# Patient Record
Sex: Female | Born: 1950 | Race: White | Hispanic: No | Smoking: Never smoker
Health system: Southern US, Community
[De-identification: ages and names within clinical notes are randomized; demographics above are authoritative.]

## PROBLEM LIST (undated history)

## (undated) DIAGNOSIS — N950 Postmenopausal bleeding: Secondary | ICD-10-CM

## (undated) DIAGNOSIS — M1990 Unspecified osteoarthritis, unspecified site: Secondary | ICD-10-CM

## (undated) DIAGNOSIS — F32A Depression, unspecified: Secondary | ICD-10-CM

## (undated) DIAGNOSIS — G8929 Other chronic pain: Secondary | ICD-10-CM

## (undated) DIAGNOSIS — M47819 Spondylosis without myelopathy or radiculopathy, site unspecified: Secondary | ICD-10-CM

## (undated) DIAGNOSIS — N840 Polyp of corpus uteri: Secondary | ICD-10-CM

## (undated) DIAGNOSIS — C801 Malignant (primary) neoplasm, unspecified: Secondary | ICD-10-CM

## (undated) DIAGNOSIS — R200 Anesthesia of skin: Secondary | ICD-10-CM

## (undated) DIAGNOSIS — Z806 Family history of leukemia: Secondary | ICD-10-CM

## (undated) DIAGNOSIS — Z808 Family history of malignant neoplasm of other organs or systems: Secondary | ICD-10-CM

## (undated) DIAGNOSIS — M810 Age-related osteoporosis without current pathological fracture: Secondary | ICD-10-CM

## (undated) DIAGNOSIS — J189 Pneumonia, unspecified organism: Secondary | ICD-10-CM

## (undated) DIAGNOSIS — M545 Low back pain, unspecified: Secondary | ICD-10-CM

## (undated) DIAGNOSIS — Z923 Personal history of irradiation: Secondary | ICD-10-CM

## (undated) DIAGNOSIS — R030 Elevated blood-pressure reading, without diagnosis of hypertension: Secondary | ICD-10-CM

## (undated) DIAGNOSIS — Z8489 Family history of other specified conditions: Secondary | ICD-10-CM

## (undated) DIAGNOSIS — Z973 Presence of spectacles and contact lenses: Secondary | ICD-10-CM

## (undated) DIAGNOSIS — F329 Major depressive disorder, single episode, unspecified: Secondary | ICD-10-CM

## (undated) HISTORY — DX: Family history of malignant neoplasm of other organs or systems: Z80.8

## (undated) HISTORY — DX: Family history of leukemia: Z80.6

## (undated) HISTORY — DX: Personal history of irradiation: Z92.3

---

## 1975-11-23 HISTORY — PX: INCISION AND DRAINAGE BREAST ABSCESS: SUR672

## 1998-08-20 ENCOUNTER — Emergency Department (HOSPITAL_COMMUNITY): Admission: EM | Admit: 1998-08-20 | Discharge: 1998-08-21 | Payer: Self-pay | Admitting: Emergency Medicine

## 2003-08-30 ENCOUNTER — Other Ambulatory Visit: Admission: RE | Admit: 2003-08-30 | Discharge: 2003-08-30 | Payer: Self-pay | Admitting: Obstetrics and Gynecology

## 2003-10-01 ENCOUNTER — Encounter: Admission: RE | Admit: 2003-10-01 | Discharge: 2003-10-01 | Payer: Self-pay | Admitting: Internal Medicine

## 2003-10-03 ENCOUNTER — Encounter: Admission: RE | Admit: 2003-10-03 | Discharge: 2003-10-03 | Payer: Self-pay | Admitting: Internal Medicine

## 2005-02-26 ENCOUNTER — Other Ambulatory Visit: Admission: RE | Admit: 2005-02-26 | Discharge: 2005-02-26 | Payer: Self-pay | Admitting: Obstetrics and Gynecology

## 2005-03-03 ENCOUNTER — Ambulatory Visit (HOSPITAL_COMMUNITY): Admission: RE | Admit: 2005-03-03 | Discharge: 2005-03-03 | Payer: Self-pay | Admitting: Obstetrics and Gynecology

## 2005-11-22 HISTORY — PX: LUMBAR FUSION: SHX111

## 2006-07-03 ENCOUNTER — Inpatient Hospital Stay (HOSPITAL_COMMUNITY): Admission: EM | Admit: 2006-07-03 | Discharge: 2006-07-12 | Payer: Self-pay | Admitting: Emergency Medicine

## 2006-07-03 ENCOUNTER — Encounter: Payer: Self-pay | Admitting: *Deleted

## 2010-12-12 ENCOUNTER — Encounter: Payer: Self-pay | Admitting: Internal Medicine

## 2011-02-12 ENCOUNTER — Emergency Department (HOSPITAL_COMMUNITY)
Admission: EM | Admit: 2011-02-12 | Discharge: 2011-02-12 | Disposition: A | Discharge status: Home / Self Care | Payer: Self-pay | Attending: Emergency Medicine | Admitting: Emergency Medicine

## 2011-02-12 DIAGNOSIS — R3915 Urgency of urination: Secondary | ICD-10-CM | POA: Insufficient documentation

## 2011-02-12 DIAGNOSIS — M810 Age-related osteoporosis without current pathological fracture: Secondary | ICD-10-CM | POA: Insufficient documentation

## 2011-02-12 DIAGNOSIS — M545 Low back pain, unspecified: Secondary | ICD-10-CM | POA: Insufficient documentation

## 2011-02-12 DIAGNOSIS — R32 Unspecified urinary incontinence: Secondary | ICD-10-CM | POA: Insufficient documentation

## 2011-02-12 DIAGNOSIS — R209 Unspecified disturbances of skin sensation: Secondary | ICD-10-CM | POA: Insufficient documentation

## 2011-02-12 DIAGNOSIS — F411 Generalized anxiety disorder: Secondary | ICD-10-CM | POA: Insufficient documentation

## 2011-02-12 LAB — URINALYSIS, ROUTINE W REFLEX MICROSCOPIC
Bilirubin Urine: NEGATIVE
Glucose, UA: NEGATIVE mg/dL
Hgb urine dipstick: NEGATIVE
Ketones, ur: NEGATIVE mg/dL
Nitrite: NEGATIVE
Protein, ur: NEGATIVE mg/dL
Specific Gravity, Urine: 1.009 (ref 1.005–1.030)
Urobilinogen, UA: 0.2 mg/dL (ref 0.0–1.0)
pH: 5.5 (ref 5.0–8.0)

## 2011-05-24 ENCOUNTER — Inpatient Hospital Stay (INDEPENDENT_AMBULATORY_CARE_PROVIDER_SITE_OTHER)
Admission: RE | Admit: 2011-05-24 | Discharge: 2011-05-24 | Disposition: A | Discharge status: Home / Self Care | Payer: Self-pay | Source: Ambulatory Visit | Attending: Family Medicine | Admitting: Family Medicine

## 2011-05-24 ENCOUNTER — Ambulatory Visit (INDEPENDENT_AMBULATORY_CARE_PROVIDER_SITE_OTHER): Payer: Self-pay

## 2011-05-24 DIAGNOSIS — M549 Dorsalgia, unspecified: Secondary | ICD-10-CM

## 2013-07-05 ENCOUNTER — Encounter (HOSPITAL_COMMUNITY): Payer: Self-pay | Admitting: *Deleted

## 2013-07-05 DIAGNOSIS — R0789 Other chest pain: Secondary | ICD-10-CM | POA: Insufficient documentation

## 2013-07-05 DIAGNOSIS — Z79899 Other long term (current) drug therapy: Secondary | ICD-10-CM | POA: Insufficient documentation

## 2013-07-05 NOTE — ED Notes (Signed)
The pt has had lt chest pain for 2 days just under her lt breast,  Sob today.  No cardiac history

## 2013-07-06 ENCOUNTER — Emergency Department (HOSPITAL_COMMUNITY)
Admission: EM | Admit: 2013-07-06 | Discharge: 2013-07-06 | Disposition: A | Discharge status: Home / Self Care | Payer: Medicaid Other | Attending: Emergency Medicine | Admitting: Emergency Medicine

## 2013-07-06 ENCOUNTER — Emergency Department (HOSPITAL_COMMUNITY): Payer: Medicaid Other

## 2013-07-06 DIAGNOSIS — R0789 Other chest pain: Secondary | ICD-10-CM

## 2013-07-06 LAB — BASIC METABOLIC PANEL
BUN: 9 mg/dL (ref 6–23)
CO2: 25 mEq/L (ref 19–32)
Calcium: 9.5 mg/dL (ref 8.4–10.5)
Chloride: 102 mEq/L (ref 96–112)
Creatinine, Ser: 0.81 mg/dL (ref 0.50–1.10)
GFR calc Af Amer: >90 mL/min (ref >90)
GFR calc non Af Amer: 78 mL/min — ABNORMAL LOW (ref >90)
Glucose, Bld: 115 mg/dL — ABNORMAL HIGH (ref 70–99)
Potassium: 3.5 mEq/L (ref 3.5–5.1)
Sodium: 139 mEq/L (ref 135–145)

## 2013-07-06 LAB — CBC
HCT: 38.1 % (ref 36.0–46.0)
Hemoglobin: 13.3 g/dL (ref 12.0–15.0)
MCH: 32.4 pg (ref 26.0–34.0)
MCHC: 34.9 g/dL (ref 30.0–36.0)
MCV: 92.9 fL (ref 78.0–100.0)
Platelets: 260 10*3/uL (ref 150–400)
RBC: 4.1 MIL/uL (ref 3.87–5.11)
RDW: 11.9 % (ref 11.5–15.5)
WBC: 9.1 10*3/uL (ref 4.0–10.5)

## 2013-07-06 LAB — POCT I-STAT TROPONIN I: Troponin i, poc: 0 ng/mL (ref 0.00–0.08)

## 2013-07-06 LAB — D-DIMER, QUANTITATIVE: D-Dimer, Quant: <0.27 ug/mL-FEU (ref 0.00–0.48)

## 2013-07-06 LAB — TROPONIN I: Troponin I: <0.3 ng/mL (ref <0.30)

## 2013-07-06 LAB — PRO B NATRIURETIC PEPTIDE: Pro B Natriuretic peptide (BNP): 46.2 pg/mL (ref 0–125)

## 2013-07-06 MED ORDER — GI COCKTAIL ~~LOC~~
30.0000 mL | Freq: Once | ORAL | Status: AC
  Administered 2013-07-06: 30 mL via ORAL
  Filled 2013-07-06: qty 30

## 2013-07-06 MED ORDER — OMEPRAZOLE 20 MG PO CPDR: 20.0000 mg | DELAYED_RELEASE_CAPSULE | Freq: Every day | ORAL | Status: DC

## 2013-07-06 MED ORDER — KETOROLAC TROMETHAMINE 30 MG/ML IJ SOLN: 30.0000 mg | Freq: Once | INTRAMUSCULAR | Status: DC

## 2013-07-06 MED ORDER — KETOROLAC TROMETHAMINE 60 MG/2ML IM SOLN
60.0000 mg | Freq: Once | INTRAMUSCULAR | Status: AC
  Administered 2013-07-06: 60 mg via INTRAMUSCULAR
  Filled 2013-07-06: qty 2

## 2013-07-06 NOTE — ED Provider Notes (Signed)
CSN: [id number]     Arrival date & time 07/05/13  2349 History     First MD Initiated Contact with Patient 07/06/13 0335     Chief Complaint  Patient presents with  . Chest Pain   (Consider location/radiation/quality/duration/timing/severity/associated sxs/prior Treatment) HPI Comments: Patient complains of intermittent left sided sternal chest pain that radiates underneath her left breast. The pain lasts for one to 2 minutes at a time and subsides. It is better with moving around. The pain does not radiate to her arm, back or neck. There is no shortness of breath, or vomiting. No diaphoresis. The pain is intermittent and persistent. She reports no cardiac history. She does not smoke. No history of hypertension or diabetes. No leg pain or swelling. No fever, cough or chills.  The history is provided by the patient.    History reviewed. No pertinent past medical history. History reviewed. No pertinent past surgical history. No family history on file. History  Substance Use Topics  . Smoking status: Never Smoker   . Smokeless tobacco: Not on file  . Alcohol Use: No   OB History   Grav Para Term Preterm Abortions TAB SAB Ect Mult Living                 Review of Systems  Constitutional: Negative for fever, activity change and appetite change.  HENT: Negative for congestion and rhinorrhea.   Respiratory: Positive for chest tightness. Negative for cough and shortness of breath.   Cardiovascular: Positive for chest pain.  Gastrointestinal: Negative for nausea, vomiting and abdominal pain.  Genitourinary: Negative for dysuria, hematuria, vaginal bleeding and vaginal discharge.  Musculoskeletal: Negative for back pain.  Skin: Negative for rash.  Neurological: Negative for dizziness, weakness and headaches.  A complete 10 system review of systems was obtained and all systems are negative except as noted in the HPI and PMH.    Allergies  Review of patient's allergies indicates no  known allergies.  Home Medications   Current Outpatient Rx  Name  Route  Sig  Dispense  Refill  . atorvastatin (LIPITOR) 10 MG tablet   Oral   Take 10 mg by mouth daily.         Marland Kitchen buPROPion (WELLBUTRIN SR) 200 MG 12 hr tablet   Oral   Take 200 mg by mouth 2 (two) times daily.         Marland Kitchen oxyCODONE-acetaminophen (PERCOCET/ROXICET) 5-325 MG per tablet   Oral   Take 1 tablet by mouth every 4 (four) hours as needed for pain.         Marland Kitchen omeprazole (PRILOSEC) 20 MG capsule   Oral   Take 1 capsule (20 mg total) by mouth daily.   30 capsule   0    BP 142/96  Pulse 87  Temp(Src) 98.8 F (37.1 C) (Oral)  Resp 14  SpO2 100% Physical Exam  Constitutional: She is oriented to person, place, and time. She appears well-developed and well-nourished. No distress.  HENT:  Head: Normocephalic and atraumatic.  Mouth/Throat: Oropharynx is clear and moist. No oropharyngeal exudate.  Eyes: Conjunctivae and EOM are normal. Pupils are equal, round, and reactive to light.  Neck: Normal range of motion. Neck supple.  Cardiovascular: Normal rate, regular rhythm and normal heart sounds.   No murmur heard. Pulmonary/Chest: Effort normal and breath sounds normal. No respiratory distress. She exhibits tenderness.  TTP L sternal border, reproduces pain No rash  Abdominal: Soft. There is no tenderness. There is no rebound  and no guarding.  Musculoskeletal: Normal range of motion. She exhibits no edema and no tenderness.  Neurological: She is alert and oriented to person, place, and time. No cranial nerve deficit. She exhibits normal muscle tone. Coordination normal.  Skin: Skin is warm. No rash noted.    ED Course   Procedures (including critical care time)  Labs Reviewed  BASIC METABOLIC PANEL - Abnormal; Notable for the following:    Glucose, Bld 115 (*)    GFR calc non Af Amer 78 (*)    All other components within normal limits  CBC  PRO B NATRIURETIC PEPTIDE  TROPONIN I  D-DIMER,  QUANTITATIVE  POCT I-STAT TROPONIN I   Dg Chest 2 View  07/06/2013   *RADIOLOGY REPORT*  Clinical Data: Chest pain.  CHEST - 2 VIEW  Comparison: Chest x-ray 07/03/2006.  Findings: Lung volumes are normal.  No consolidative airspace disease.  No pleural effusions.  No pneumothorax.  No pulmonary nodule or mass noted.  Pulmonary vasculature and the cardiomediastinal silhouette are within normal limits.  Orthopedic fixation hardware in the lumbar spine.  IMPRESSION: 1. No radiographic evidence of acute cardiopulmonary disease.   Original Report Authenticated By: Trudie Reed, M.D.   1. Atypical chest pain     MDM  Intermittent episodes of left-sided chest pain lasting for a few minutes at a time. No radiation. No nausea, diaphoresis, dizziness shortness of breath. No cardiac history.  EKG nonischemic. Troponin is negative. Pain is reproducible on exam. Delta troponin is negative. D-dimer is negative.  Patient's history and exam is not consistent with ACS or PE. She is offered observation admission which she declined. She will schedule a stress test with her Dr. Doreatha Lew return to ED for new or worsening symptoms.   Date: 07/06/2013  Rate: 97  Rhythm: normal sinus rhythm  QRS Axis: normal  Intervals: normal  ST/T Wave abnormalities: nonspecific ST/T changes  Conduction Disutrbances:none  Narrative Interpretation:   Old EKG Reviewed: unchanged     Date: 07/06/2013  Rate: 74  Rhythm: normal sinus rhythm  QRS Axis: normal  Intervals: normal  ST/T Wave abnormalities: normal  Conduction Disutrbances:none  Narrative Interpretation:   Old EKG Reviewed: unchanged     Glynn Octave, MD 07/06/13 [phone]

## 2013-07-06 NOTE — ED Notes (Signed)
NURSE FIRST ROUNDS : NURSE EXPLAINED DELAY , WAIT TIME AND PROCESS , NO CHEST PAIN AT THIS TIME / RESPIRATIONS UNLABORED.

## 2013-08-31 DIAGNOSIS — G894 Chronic pain syndrome: Secondary | ICD-10-CM | POA: Diagnosis not present

## 2013-08-31 DIAGNOSIS — Z23 Encounter for immunization: Secondary | ICD-10-CM | POA: Diagnosis not present

## 2013-08-31 DIAGNOSIS — F339 Major depressive disorder, recurrent, unspecified: Secondary | ICD-10-CM | POA: Diagnosis not present

## 2013-08-31 DIAGNOSIS — R079 Chest pain, unspecified: Secondary | ICD-10-CM | POA: Diagnosis not present

## 2013-08-31 DIAGNOSIS — Z1239 Encounter for other screening for malignant neoplasm of breast: Secondary | ICD-10-CM | POA: Diagnosis not present

## 2013-09-21 DIAGNOSIS — Z1231 Encounter for screening mammogram for malignant neoplasm of breast: Secondary | ICD-10-CM | POA: Diagnosis not present

## 2013-11-22 DIAGNOSIS — J189 Pneumonia, unspecified organism: Secondary | ICD-10-CM

## 2013-11-22 HISTORY — DX: Pneumonia, unspecified organism: J18.9

## 2013-12-21 DIAGNOSIS — E785 Hyperlipidemia, unspecified: Secondary | ICD-10-CM | POA: Diagnosis not present

## 2013-12-21 DIAGNOSIS — G894 Chronic pain syndrome: Secondary | ICD-10-CM | POA: Diagnosis not present

## 2013-12-21 DIAGNOSIS — I10 Essential (primary) hypertension: Secondary | ICD-10-CM | POA: Diagnosis not present

## 2013-12-21 DIAGNOSIS — F339 Major depressive disorder, recurrent, unspecified: Secondary | ICD-10-CM | POA: Diagnosis not present

## 2014-04-01 DIAGNOSIS — G894 Chronic pain syndrome: Secondary | ICD-10-CM | POA: Diagnosis not present

## 2014-04-01 DIAGNOSIS — Z1331 Encounter for screening for depression: Secondary | ICD-10-CM | POA: Diagnosis not present

## 2014-04-01 DIAGNOSIS — I10 Essential (primary) hypertension: Secondary | ICD-10-CM | POA: Diagnosis not present

## 2014-04-01 DIAGNOSIS — F339 Major depressive disorder, recurrent, unspecified: Secondary | ICD-10-CM | POA: Diagnosis not present

## 2014-04-01 DIAGNOSIS — R300 Dysuria: Secondary | ICD-10-CM | POA: Diagnosis not present

## 2014-05-22 ENCOUNTER — Emergency Department (INDEPENDENT_AMBULATORY_CARE_PROVIDER_SITE_OTHER): Payer: Medicare Other

## 2014-05-22 ENCOUNTER — Encounter (HOSPITAL_COMMUNITY): Payer: Self-pay | Admitting: Emergency Medicine

## 2014-05-22 ENCOUNTER — Emergency Department (INDEPENDENT_AMBULATORY_CARE_PROVIDER_SITE_OTHER)
Admission: EM | Admit: 2014-05-22 | Discharge: 2014-05-22 | Disposition: A | Discharge status: Home / Self Care | Payer: Medicare Other | Source: Home / Self Care | Attending: Family Medicine | Admitting: Family Medicine

## 2014-05-22 DIAGNOSIS — N390 Urinary tract infection, site not specified: Secondary | ICD-10-CM

## 2014-05-22 DIAGNOSIS — J189 Pneumonia, unspecified organism: Secondary | ICD-10-CM

## 2014-05-22 DIAGNOSIS — R059 Cough, unspecified: Secondary | ICD-10-CM | POA: Diagnosis not present

## 2014-05-22 DIAGNOSIS — R05 Cough: Secondary | ICD-10-CM | POA: Diagnosis not present

## 2014-05-22 DIAGNOSIS — R509 Fever, unspecified: Secondary | ICD-10-CM | POA: Diagnosis not present

## 2014-05-22 HISTORY — DX: Major depressive disorder, single episode, unspecified: F32.9

## 2014-05-22 HISTORY — DX: Depression, unspecified: F32.A

## 2014-05-22 LAB — POCT URINALYSIS DIP (DEVICE)
BILIRUBIN URINE: NEGATIVE
Glucose, UA: NEGATIVE mg/dL
Ketones, ur: NEGATIVE mg/dL
NITRITE: POSITIVE — AB
PH: 6 (ref 5.0–8.0)
Protein, ur: 100 mg/dL — AB
Urobilinogen, UA: 0.2 mg/dL (ref 0.0–1.0)

## 2014-05-22 MED ORDER — AZITHROMYCIN 250 MG PO TABS
ORAL_TABLET | ORAL | Status: AC
  Filled 2014-05-22: qty 4

## 2014-05-22 MED ORDER — LIDOCAINE HCL (PF) 1 % IJ SOLN
INTRAMUSCULAR | Status: AC
  Filled 2014-05-22: qty 5

## 2014-05-22 MED ORDER — AZITHROMYCIN 250 MG PO TABS
1000.0000 mg | ORAL_TABLET | Freq: Once | ORAL | Status: AC
  Administered 2014-05-22: 1000 mg via ORAL

## 2014-05-22 MED ORDER — CEFTRIAXONE SODIUM 1 G IJ SOLR
INTRAMUSCULAR | Status: AC
  Filled 2014-05-22: qty 10

## 2014-05-22 MED ORDER — CEFTRIAXONE SODIUM 1 G IJ SOLR
1.0000 g | Freq: Once | INTRAMUSCULAR | Status: AC
  Administered 2014-05-22: 1 g via INTRAMUSCULAR

## 2014-05-22 MED ORDER — LEVOFLOXACIN 500 MG PO TABS: 500.0000 mg | ORAL_TABLET | Freq: Every day | ORAL | Status: DC

## 2014-05-22 NOTE — Discharge Instructions (Signed)
Drink plenty of fluids, take all of medicine, see your doctor in 7-10 days for recheck of urine and chest x-ray.

## 2014-05-22 NOTE — ED Notes (Signed)
C/o burning and frequency of urination onset 2 weeks ago.  Drank water, cranberry juice, and AZO with symptom relief.  C/o fever 103, cough onset Thur.

## 2014-05-22 NOTE — ED Provider Notes (Signed)
CSN: [id number]     Arrival date & time 05/22/14  1734 History   First MD Initiated Contact with Patient 05/22/14 1840     No chief complaint on file.  (Consider location/radiation/quality/duration/timing/severity/associated sxs/prior Treatment) Patient is a 63 y.o. female presenting with fever. The history is provided by the patient.  Fever Max temp prior to arrival:  103 Temp source:  Oral Severity:  Moderate Onset quality:  Sudden Duration:  5 days Progression:  Worsening Chronicity:  New Associated symptoms: chills, cough, diarrhea and dysuria   Associated symptoms: no nausea and no vomiting   Associated symptoms comment:  Urinary sx for 2 wks, cough since sat, diarrhea for 3d,  Cough nonprod.   No past medical history on file. No past surgical history on file. No family history on file. History  Substance Use Topics  . Smoking status: Never Smoker   . Smokeless tobacco: Not on file  . Alcohol Use: No   OB History   Grav Para Term Preterm Abortions TAB SAB Ect Mult Living                 Review of Systems  Constitutional: Positive for fever and chills. Negative for activity change and appetite change.  HENT: Positive for voice change. Negative for postnasal drip.   Respiratory: Positive for cough.   Gastrointestinal: Positive for diarrhea. Negative for nausea and vomiting.  Genitourinary: Positive for dysuria, urgency and frequency.    Allergies  Review of patient's allergies indicates no known allergies.  Home Medications   Prior to Admission medications   Medication Sig Start Date End Date Taking? Authorizing Provider  atorvastatin (LIPITOR) 10 MG tablet Take 10 mg by mouth daily.    Historical Provider, MD  buPROPion (WELLBUTRIN SR) 200 MG 12 hr tablet Take 200 mg by mouth 2 (two) times daily.    Historical Provider, MD  levofloxacin (LEVAQUIN) 500 MG tablet Take 1 tablet (500 mg total) by mouth daily. 05/22/14   Billy Fischer, MD  omeprazole (PRILOSEC) 20 MG  capsule Take 1 capsule (20 mg total) by mouth daily. 07/06/13   Ezequiel Essex, MD  oxyCODONE-acetaminophen (PERCOCET/ROXICET) 5-325 MG per tablet Take 1 tablet by mouth every 4 (four) hours as needed for pain.    Historical Provider, MD   BP 142/84  Pulse 106  Temp(Src) 100.1 F (37.8 C) (Oral)  Resp 16  SpO2 95% Physical Exam  Vitals reviewed. Constitutional: She is oriented to person, place, and time.  Neck: Normal range of motion. Neck supple.  Cardiovascular: Normal heart sounds.   Pulmonary/Chest: Effort normal and breath sounds normal. She exhibits no tenderness.  Abdominal: Soft. Bowel sounds are normal. She exhibits no distension. There is tenderness in the suprapubic area. There is no rigidity, no rebound, no guarding, no CVA tenderness and no tenderness at McBurney's point.  Lymphadenopathy:    She has no cervical adenopathy.  Neurological: She is alert and oriented to person, place, and time.  Skin: Skin is warm and dry.    ED Course  Procedures (including critical care time) Labs Review Labs Reviewed  POCT URINALYSIS DIP (DEVICE) - Abnormal; Notable for the following:    Hgb urine dipstick MODERATE (*)    Protein, ur 100 (*)    Nitrite POSITIVE (*)    Leukocytes, UA LARGE (*)    All other components within normal limits    Imaging Review Dg Chest 2 View  05/22/2014   ADDENDUM REPORT: 05/22/2014 19:54  ADDENDUM: I have  reviewed the images. There is streaky infiltrate right lower lobe posteromedially best seen on lateral view. This is highly suspicious for pneumonia. I discussed with Dr. Juventino Slovak   Electronically Signed   By: Lahoma Crocker M.D.   On: 05/22/2014 19:54   05/22/2014   CLINICAL DATA:  Cough, fever  EXAM: CHEST  2 VIEW  COMPARISON:  07/06/2013  FINDINGS: Cardiomediastinal silhouette is stable. No acute infiltrate or pleural effusion. No pulmonary edema. Again noted metallic fixation material lumbar spine. Minimal compression deformity mid thoracic spine of  indeterminate age.  IMPRESSION: No active cardiopulmonary disease. Stable postsurgical changes lumbar spine. Minimal compression deformity mid thoracic spine of indeterminate age.  Electronically Signed: By: Lahoma Crocker M.D. On: 05/22/2014 19:28     MDM   1. UTI (lower urinary tract infection)   2. CAP (community acquired pneumonia)        Billy Fischer, MD 05/22/14 2000

## 2014-08-12 DIAGNOSIS — G894 Chronic pain syndrome: Secondary | ICD-10-CM | POA: Diagnosis not present

## 2014-08-12 DIAGNOSIS — I10 Essential (primary) hypertension: Secondary | ICD-10-CM | POA: Diagnosis not present

## 2014-08-12 DIAGNOSIS — Z1331 Encounter for screening for depression: Secondary | ICD-10-CM | POA: Diagnosis not present

## 2014-08-12 DIAGNOSIS — Z23 Encounter for immunization: Secondary | ICD-10-CM | POA: Diagnosis not present

## 2014-09-24 DIAGNOSIS — Z1231 Encounter for screening mammogram for malignant neoplasm of breast: Secondary | ICD-10-CM | POA: Diagnosis not present

## 2014-10-02 DIAGNOSIS — Z124 Encounter for screening for malignant neoplasm of cervix: Secondary | ICD-10-CM | POA: Diagnosis not present

## 2014-10-02 DIAGNOSIS — Z01419 Encounter for gynecological examination (general) (routine) without abnormal findings: Secondary | ICD-10-CM | POA: Diagnosis not present

## 2014-10-08 DIAGNOSIS — N958 Other specified menopausal and perimenopausal disorders: Secondary | ICD-10-CM | POA: Diagnosis not present

## 2014-10-08 DIAGNOSIS — M816 Localized osteoporosis [Lequesne]: Secondary | ICD-10-CM | POA: Diagnosis not present

## 2014-10-12 DIAGNOSIS — M25552 Pain in left hip: Secondary | ICD-10-CM | POA: Diagnosis not present

## 2014-10-23 DIAGNOSIS — M810 Age-related osteoporosis without current pathological fracture: Secondary | ICD-10-CM | POA: Diagnosis not present

## 2014-10-23 DIAGNOSIS — M545 Low back pain: Secondary | ICD-10-CM | POA: Diagnosis not present

## 2014-10-23 DIAGNOSIS — Z0000 Encounter for general adult medical examination without abnormal findings: Secondary | ICD-10-CM | POA: Diagnosis not present

## 2014-10-23 DIAGNOSIS — F329 Major depressive disorder, single episode, unspecified: Secondary | ICD-10-CM | POA: Diagnosis not present

## 2014-10-23 DIAGNOSIS — I10 Essential (primary) hypertension: Secondary | ICD-10-CM | POA: Diagnosis not present

## 2014-10-25 DIAGNOSIS — M810 Age-related osteoporosis without current pathological fracture: Secondary | ICD-10-CM | POA: Diagnosis not present

## 2014-10-25 DIAGNOSIS — Z0000 Encounter for general adult medical examination without abnormal findings: Secondary | ICD-10-CM | POA: Diagnosis not present

## 2014-10-25 DIAGNOSIS — I10 Essential (primary) hypertension: Secondary | ICD-10-CM | POA: Diagnosis not present

## 2014-11-04 DIAGNOSIS — M545 Low back pain: Secondary | ICD-10-CM | POA: Diagnosis not present

## 2014-11-04 DIAGNOSIS — F329 Major depressive disorder, single episode, unspecified: Secondary | ICD-10-CM | POA: Diagnosis not present

## 2014-11-04 DIAGNOSIS — E780 Pure hypercholesterolemia: Secondary | ICD-10-CM | POA: Diagnosis not present

## 2014-11-04 DIAGNOSIS — M25552 Pain in left hip: Secondary | ICD-10-CM | POA: Diagnosis not present

## 2014-11-07 DIAGNOSIS — M25552 Pain in left hip: Secondary | ICD-10-CM | POA: Diagnosis not present

## 2014-12-24 DIAGNOSIS — G894 Chronic pain syndrome: Secondary | ICD-10-CM | POA: Diagnosis not present

## 2014-12-24 DIAGNOSIS — M961 Postlaminectomy syndrome, not elsewhere classified: Secondary | ICD-10-CM | POA: Diagnosis not present

## 2014-12-24 DIAGNOSIS — Z79899 Other long term (current) drug therapy: Secondary | ICD-10-CM | POA: Diagnosis not present

## 2014-12-24 DIAGNOSIS — M545 Low back pain: Secondary | ICD-10-CM | POA: Diagnosis not present

## 2014-12-24 DIAGNOSIS — M79606 Pain in leg, unspecified: Secondary | ICD-10-CM | POA: Diagnosis not present

## 2014-12-25 DIAGNOSIS — G894 Chronic pain syndrome: Secondary | ICD-10-CM | POA: Diagnosis not present

## 2014-12-25 DIAGNOSIS — Z79899 Other long term (current) drug therapy: Secondary | ICD-10-CM | POA: Diagnosis not present

## 2015-01-01 DIAGNOSIS — M545 Low back pain: Secondary | ICD-10-CM | POA: Diagnosis not present

## 2015-01-01 DIAGNOSIS — M79606 Pain in leg, unspecified: Secondary | ICD-10-CM | POA: Diagnosis not present

## 2015-01-01 DIAGNOSIS — M961 Postlaminectomy syndrome, not elsewhere classified: Secondary | ICD-10-CM | POA: Diagnosis not present

## 2015-01-08 DIAGNOSIS — Z8701 Personal history of pneumonia (recurrent): Secondary | ICD-10-CM | POA: Diagnosis not present

## 2015-01-08 DIAGNOSIS — R251 Tremor, unspecified: Secondary | ICD-10-CM | POA: Diagnosis not present

## 2015-01-08 DIAGNOSIS — J4521 Mild intermittent asthma with (acute) exacerbation: Secondary | ICD-10-CM | POA: Diagnosis not present

## 2015-01-08 DIAGNOSIS — Z23 Encounter for immunization: Secondary | ICD-10-CM | POA: Diagnosis not present

## 2015-01-16 DIAGNOSIS — M79606 Pain in leg, unspecified: Secondary | ICD-10-CM | POA: Diagnosis not present

## 2015-01-16 DIAGNOSIS — M545 Low back pain: Secondary | ICD-10-CM | POA: Diagnosis not present

## 2015-01-30 DIAGNOSIS — M79606 Pain in leg, unspecified: Secondary | ICD-10-CM | POA: Diagnosis not present

## 2015-01-30 DIAGNOSIS — G894 Chronic pain syndrome: Secondary | ICD-10-CM | POA: Diagnosis not present

## 2015-01-30 DIAGNOSIS — M961 Postlaminectomy syndrome, not elsewhere classified: Secondary | ICD-10-CM | POA: Diagnosis not present

## 2015-01-30 DIAGNOSIS — M545 Low back pain: Secondary | ICD-10-CM | POA: Diagnosis not present

## 2015-01-30 DIAGNOSIS — Z79899 Other long term (current) drug therapy: Secondary | ICD-10-CM | POA: Diagnosis not present

## 2015-02-27 DIAGNOSIS — M79606 Pain in leg, unspecified: Secondary | ICD-10-CM | POA: Diagnosis not present

## 2015-02-27 DIAGNOSIS — M961 Postlaminectomy syndrome, not elsewhere classified: Secondary | ICD-10-CM | POA: Diagnosis not present

## 2015-02-27 DIAGNOSIS — M545 Low back pain: Secondary | ICD-10-CM | POA: Diagnosis not present

## 2015-04-02 DIAGNOSIS — Z79899 Other long term (current) drug therapy: Secondary | ICD-10-CM | POA: Diagnosis not present

## 2015-04-02 DIAGNOSIS — G894 Chronic pain syndrome: Secondary | ICD-10-CM | POA: Diagnosis not present

## 2015-04-02 DIAGNOSIS — M961 Postlaminectomy syndrome, not elsewhere classified: Secondary | ICD-10-CM | POA: Diagnosis not present

## 2015-04-02 DIAGNOSIS — M488X6 Other specified spondylopathies, lumbar region: Secondary | ICD-10-CM | POA: Diagnosis not present

## 2015-04-02 DIAGNOSIS — M545 Low back pain: Secondary | ICD-10-CM | POA: Diagnosis not present

## 2015-04-02 DIAGNOSIS — M79606 Pain in leg, unspecified: Secondary | ICD-10-CM | POA: Diagnosis not present

## 2015-04-28 DIAGNOSIS — M79606 Pain in leg, unspecified: Secondary | ICD-10-CM | POA: Diagnosis not present

## 2015-04-28 DIAGNOSIS — M488X6 Other specified spondylopathies, lumbar region: Secondary | ICD-10-CM | POA: Diagnosis not present

## 2015-04-28 DIAGNOSIS — M961 Postlaminectomy syndrome, not elsewhere classified: Secondary | ICD-10-CM | POA: Diagnosis not present

## 2015-04-28 DIAGNOSIS — G894 Chronic pain syndrome: Secondary | ICD-10-CM | POA: Diagnosis not present

## 2015-04-28 DIAGNOSIS — M47817 Spondylosis without myelopathy or radiculopathy, lumbosacral region: Secondary | ICD-10-CM | POA: Diagnosis not present

## 2015-04-28 DIAGNOSIS — Z79899 Other long term (current) drug therapy: Secondary | ICD-10-CM | POA: Diagnosis not present

## 2015-04-28 DIAGNOSIS — M545 Low back pain: Secondary | ICD-10-CM | POA: Diagnosis not present

## 2015-05-27 DIAGNOSIS — M47817 Spondylosis without myelopathy or radiculopathy, lumbosacral region: Secondary | ICD-10-CM | POA: Diagnosis not present

## 2015-05-27 DIAGNOSIS — M488X6 Other specified spondylopathies, lumbar region: Secondary | ICD-10-CM | POA: Diagnosis not present

## 2015-05-27 DIAGNOSIS — M545 Low back pain: Secondary | ICD-10-CM | POA: Diagnosis not present

## 2015-05-27 DIAGNOSIS — M961 Postlaminectomy syndrome, not elsewhere classified: Secondary | ICD-10-CM | POA: Diagnosis not present

## 2015-05-27 DIAGNOSIS — Z79899 Other long term (current) drug therapy: Secondary | ICD-10-CM | POA: Diagnosis not present

## 2015-05-27 DIAGNOSIS — G894 Chronic pain syndrome: Secondary | ICD-10-CM | POA: Diagnosis not present

## 2015-06-10 DIAGNOSIS — M47817 Spondylosis without myelopathy or radiculopathy, lumbosacral region: Secondary | ICD-10-CM | POA: Diagnosis not present

## 2015-06-10 DIAGNOSIS — M488X6 Other specified spondylopathies, lumbar region: Secondary | ICD-10-CM | POA: Diagnosis not present

## 2015-06-10 DIAGNOSIS — M545 Low back pain: Secondary | ICD-10-CM | POA: Diagnosis not present

## 2015-06-10 DIAGNOSIS — M961 Postlaminectomy syndrome, not elsewhere classified: Secondary | ICD-10-CM | POA: Diagnosis not present

## 2015-06-24 DIAGNOSIS — G894 Chronic pain syndrome: Secondary | ICD-10-CM | POA: Diagnosis not present

## 2015-06-24 DIAGNOSIS — M961 Postlaminectomy syndrome, not elsewhere classified: Secondary | ICD-10-CM | POA: Diagnosis not present

## 2015-06-24 DIAGNOSIS — M47817 Spondylosis without myelopathy or radiculopathy, lumbosacral region: Secondary | ICD-10-CM | POA: Diagnosis not present

## 2015-06-24 DIAGNOSIS — M488X6 Other specified spondylopathies, lumbar region: Secondary | ICD-10-CM | POA: Diagnosis not present

## 2015-06-24 DIAGNOSIS — M545 Low back pain: Secondary | ICD-10-CM | POA: Diagnosis not present

## 2015-06-24 DIAGNOSIS — Z79899 Other long term (current) drug therapy: Secondary | ICD-10-CM | POA: Diagnosis not present

## 2015-07-22 DIAGNOSIS — M47817 Spondylosis without myelopathy or radiculopathy, lumbosacral region: Secondary | ICD-10-CM | POA: Diagnosis not present

## 2015-07-22 DIAGNOSIS — M488X6 Other specified spondylopathies, lumbar region: Secondary | ICD-10-CM | POA: Diagnosis not present

## 2015-07-22 DIAGNOSIS — G894 Chronic pain syndrome: Secondary | ICD-10-CM | POA: Diagnosis not present

## 2015-07-22 DIAGNOSIS — M961 Postlaminectomy syndrome, not elsewhere classified: Secondary | ICD-10-CM | POA: Diagnosis not present

## 2015-07-22 DIAGNOSIS — Z79899 Other long term (current) drug therapy: Secondary | ICD-10-CM | POA: Diagnosis not present

## 2015-08-06 DIAGNOSIS — M488X6 Other specified spondylopathies, lumbar region: Secondary | ICD-10-CM | POA: Diagnosis not present

## 2015-08-06 DIAGNOSIS — M961 Postlaminectomy syndrome, not elsewhere classified: Secondary | ICD-10-CM | POA: Diagnosis not present

## 2015-08-06 DIAGNOSIS — M47817 Spondylosis without myelopathy or radiculopathy, lumbosacral region: Secondary | ICD-10-CM | POA: Diagnosis not present

## 2015-08-06 DIAGNOSIS — F4542 Pain disorder with related psychological factors: Secondary | ICD-10-CM | POA: Diagnosis not present

## 2015-08-19 DIAGNOSIS — G894 Chronic pain syndrome: Secondary | ICD-10-CM | POA: Diagnosis not present

## 2015-08-19 DIAGNOSIS — M545 Low back pain: Secondary | ICD-10-CM | POA: Diagnosis not present

## 2015-08-19 DIAGNOSIS — Z79899 Other long term (current) drug therapy: Secondary | ICD-10-CM | POA: Diagnosis not present

## 2015-08-19 DIAGNOSIS — M1288 Other specific arthropathies, not elsewhere classified, other specified site: Secondary | ICD-10-CM | POA: Diagnosis not present

## 2015-08-19 DIAGNOSIS — M961 Postlaminectomy syndrome, not elsewhere classified: Secondary | ICD-10-CM | POA: Diagnosis not present

## 2015-08-19 DIAGNOSIS — M47817 Spondylosis without myelopathy or radiculopathy, lumbosacral region: Secondary | ICD-10-CM | POA: Diagnosis not present

## 2015-09-25 DIAGNOSIS — M47817 Spondylosis without myelopathy or radiculopathy, lumbosacral region: Secondary | ICD-10-CM | POA: Diagnosis not present

## 2015-09-25 DIAGNOSIS — Z79891 Long term (current) use of opiate analgesic: Secondary | ICD-10-CM | POA: Diagnosis not present

## 2015-09-25 DIAGNOSIS — M1288 Other specific arthropathies, not elsewhere classified, other specified site: Secondary | ICD-10-CM | POA: Diagnosis not present

## 2015-09-25 DIAGNOSIS — Z79899 Other long term (current) drug therapy: Secondary | ICD-10-CM | POA: Diagnosis not present

## 2015-09-25 DIAGNOSIS — G894 Chronic pain syndrome: Secondary | ICD-10-CM | POA: Diagnosis not present

## 2015-09-25 DIAGNOSIS — M961 Postlaminectomy syndrome, not elsewhere classified: Secondary | ICD-10-CM | POA: Diagnosis not present

## 2015-09-26 ENCOUNTER — Other Ambulatory Visit: Payer: Self-pay | Admitting: Pain Medicine

## 2015-09-26 DIAGNOSIS — M545 Low back pain: Secondary | ICD-10-CM

## 2015-10-11 ENCOUNTER — Ambulatory Visit
Admission: RE | Admit: 2015-10-11 | Discharge: 2015-10-11 | Disposition: A | Discharge status: Home / Self Care | Payer: Medicare Other | Source: Ambulatory Visit | Attending: Pain Medicine | Admitting: Pain Medicine

## 2015-10-11 DIAGNOSIS — M5126 Other intervertebral disc displacement, lumbar region: Secondary | ICD-10-CM | POA: Diagnosis not present

## 2015-10-11 DIAGNOSIS — M545 Low back pain: Secondary | ICD-10-CM

## 2015-11-20 DIAGNOSIS — M545 Low back pain: Secondary | ICD-10-CM | POA: Diagnosis not present

## 2015-11-20 DIAGNOSIS — M47817 Spondylosis without myelopathy or radiculopathy, lumbosacral region: Secondary | ICD-10-CM | POA: Diagnosis not present

## 2015-11-20 DIAGNOSIS — Z79899 Other long term (current) drug therapy: Secondary | ICD-10-CM | POA: Diagnosis not present

## 2015-11-20 DIAGNOSIS — M961 Postlaminectomy syndrome, not elsewhere classified: Secondary | ICD-10-CM | POA: Diagnosis not present

## 2015-11-20 DIAGNOSIS — G894 Chronic pain syndrome: Secondary | ICD-10-CM | POA: Diagnosis not present

## 2015-11-20 DIAGNOSIS — M1288 Other specific arthropathies, not elsewhere classified, other specified site: Secondary | ICD-10-CM | POA: Diagnosis not present

## 2015-11-23 DIAGNOSIS — C801 Malignant (primary) neoplasm, unspecified: Secondary | ICD-10-CM

## 2015-11-23 HISTORY — DX: Malignant (primary) neoplasm, unspecified: C80.1

## 2015-12-18 DIAGNOSIS — Z79899 Other long term (current) drug therapy: Secondary | ICD-10-CM | POA: Diagnosis not present

## 2015-12-18 DIAGNOSIS — Z79891 Long term (current) use of opiate analgesic: Secondary | ICD-10-CM | POA: Diagnosis not present

## 2015-12-18 DIAGNOSIS — G894 Chronic pain syndrome: Secondary | ICD-10-CM | POA: Diagnosis not present

## 2015-12-18 DIAGNOSIS — M1288 Other specific arthropathies, not elsewhere classified, other specified site: Secondary | ICD-10-CM | POA: Diagnosis not present

## 2015-12-18 DIAGNOSIS — M961 Postlaminectomy syndrome, not elsewhere classified: Secondary | ICD-10-CM | POA: Diagnosis not present

## 2015-12-18 DIAGNOSIS — M47817 Spondylosis without myelopathy or radiculopathy, lumbosacral region: Secondary | ICD-10-CM | POA: Diagnosis not present

## 2015-12-18 DIAGNOSIS — M545 Low back pain: Secondary | ICD-10-CM | POA: Diagnosis not present

## 2016-01-20 DIAGNOSIS — M961 Postlaminectomy syndrome, not elsewhere classified: Secondary | ICD-10-CM | POA: Diagnosis not present

## 2016-01-20 DIAGNOSIS — M545 Low back pain: Secondary | ICD-10-CM | POA: Diagnosis not present

## 2016-01-20 DIAGNOSIS — Z79899 Other long term (current) drug therapy: Secondary | ICD-10-CM | POA: Diagnosis not present

## 2016-01-20 DIAGNOSIS — G894 Chronic pain syndrome: Secondary | ICD-10-CM | POA: Diagnosis not present

## 2016-02-17 DIAGNOSIS — Z79891 Long term (current) use of opiate analgesic: Secondary | ICD-10-CM | POA: Diagnosis not present

## 2016-02-17 DIAGNOSIS — Z79899 Other long term (current) drug therapy: Secondary | ICD-10-CM | POA: Diagnosis not present

## 2016-02-17 DIAGNOSIS — M47817 Spondylosis without myelopathy or radiculopathy, lumbosacral region: Secondary | ICD-10-CM | POA: Diagnosis not present

## 2016-02-17 DIAGNOSIS — M1288 Other specific arthropathies, not elsewhere classified, other specified site: Secondary | ICD-10-CM | POA: Diagnosis not present

## 2016-02-17 DIAGNOSIS — M961 Postlaminectomy syndrome, not elsewhere classified: Secondary | ICD-10-CM | POA: Diagnosis not present

## 2016-02-17 DIAGNOSIS — G894 Chronic pain syndrome: Secondary | ICD-10-CM | POA: Diagnosis not present

## 2016-03-22 DIAGNOSIS — G894 Chronic pain syndrome: Secondary | ICD-10-CM | POA: Diagnosis not present

## 2016-03-22 DIAGNOSIS — M1288 Other specific arthropathies, not elsewhere classified, other specified site: Secondary | ICD-10-CM | POA: Diagnosis not present

## 2016-03-22 DIAGNOSIS — M47817 Spondylosis without myelopathy or radiculopathy, lumbosacral region: Secondary | ICD-10-CM | POA: Diagnosis not present

## 2016-03-22 DIAGNOSIS — Z79891 Long term (current) use of opiate analgesic: Secondary | ICD-10-CM | POA: Diagnosis not present

## 2016-03-22 DIAGNOSIS — M961 Postlaminectomy syndrome, not elsewhere classified: Secondary | ICD-10-CM | POA: Diagnosis not present

## 2016-03-22 DIAGNOSIS — Z79899 Other long term (current) drug therapy: Secondary | ICD-10-CM | POA: Diagnosis not present

## 2016-04-26 DIAGNOSIS — M47817 Spondylosis without myelopathy or radiculopathy, lumbosacral region: Secondary | ICD-10-CM | POA: Diagnosis not present

## 2016-04-26 DIAGNOSIS — Z79891 Long term (current) use of opiate analgesic: Secondary | ICD-10-CM | POA: Diagnosis not present

## 2016-04-26 DIAGNOSIS — M1288 Other specific arthropathies, not elsewhere classified, other specified site: Secondary | ICD-10-CM | POA: Diagnosis not present

## 2016-04-26 DIAGNOSIS — Z79899 Other long term (current) drug therapy: Secondary | ICD-10-CM | POA: Diagnosis not present

## 2016-04-26 DIAGNOSIS — M961 Postlaminectomy syndrome, not elsewhere classified: Secondary | ICD-10-CM | POA: Diagnosis not present

## 2016-04-26 DIAGNOSIS — G894 Chronic pain syndrome: Secondary | ICD-10-CM | POA: Diagnosis not present

## 2016-05-20 DIAGNOSIS — N950 Postmenopausal bleeding: Secondary | ICD-10-CM | POA: Diagnosis not present

## 2016-05-20 DIAGNOSIS — Z124 Encounter for screening for malignant neoplasm of cervix: Secondary | ICD-10-CM | POA: Diagnosis not present

## 2016-05-20 DIAGNOSIS — N760 Acute vaginitis: Secondary | ICD-10-CM | POA: Diagnosis not present

## 2016-05-26 DIAGNOSIS — M1288 Other specific arthropathies, not elsewhere classified, other specified site: Secondary | ICD-10-CM | POA: Diagnosis not present

## 2016-05-26 DIAGNOSIS — Z79891 Long term (current) use of opiate analgesic: Secondary | ICD-10-CM | POA: Diagnosis not present

## 2016-05-26 DIAGNOSIS — Z79899 Other long term (current) drug therapy: Secondary | ICD-10-CM | POA: Diagnosis not present

## 2016-05-26 DIAGNOSIS — M961 Postlaminectomy syndrome, not elsewhere classified: Secondary | ICD-10-CM | POA: Diagnosis not present

## 2016-05-26 DIAGNOSIS — M47817 Spondylosis without myelopathy or radiculopathy, lumbosacral region: Secondary | ICD-10-CM | POA: Diagnosis not present

## 2016-05-26 DIAGNOSIS — G894 Chronic pain syndrome: Secondary | ICD-10-CM | POA: Diagnosis not present

## 2016-06-01 DIAGNOSIS — N950 Postmenopausal bleeding: Secondary | ICD-10-CM | POA: Diagnosis not present

## 2016-06-15 NOTE — H&P (Signed)
[name]  DICTATION # [id number] CSN# [id number]   Margarette Asal, MD 06/15/2016 10:06 AM

## 2016-06-16 NOTE — H&P (Signed)
[name], [name]               ACCOUNT NO.:  [id number]  MEDICAL RECORD NO.:  [id number]  LOCATION:                                 FACILITY:  PHYSICIAN:  Ralene Bathe. Matthew Saras, M.D.DATE OF BIRTH:  1951/01/03  DATE OF ADMISSION:  07/06/2016 DATE OF DISCHARGE:                             HISTORY & PHYSICAL   CHIEF COMPLAINT:  Postmenopausal bleeding.  HISTORY OF PRESENT ILLNESS:  A 65 year old postmenopausal patient who in May of this year experienced some bleeding that was evaluated in our office.  She did have a small cervical polyp noted, was scheduled for sonohysterogram, which was performed on June 01, 2016, that showed possibly hematometra, ovaries unremarkable, and what appeared to be a small anterior polyp.  She presents now for hysteroscopy with possible MyoSure, D and C.  This procedure including specific risks related to bleeding, infection, other complications such as perforation, and may require additional surgery were discussed with her along with her expected recovery time.  ALLERGIES:  None.  CURRENT MEDICATIONS:  Metoprolol, Wellbutrin.  SURGICAL HISTORY:  She has had back surgery in the past.  REVIEW OF SYSTEMS:  Significant for headache, UTI, osteoporosis, arthritis, hypertension.  FAMILY HISTORY:  Significant for colon cancer, leukemia, diabetes, arthritis, diverticulosis, osteoporosis, UTI, kidney disease, gallbladder disease, thyroid disease, anemia, IBS, peptic ulcer disease, heart disease, and headache.  SOCIAL HISTORY:  She is divorced.  She does drink alcohol socially. Denies tobacco or drug use.  Last Pap dated June 17 showed some minimal atypical cells favor endometrial cells, suggests clinical evaluation which is coming up in the form of her D and C.  PHYSICAL EXAM:  VITAL SIGNS:  Temp 98.2, blood pressure 120/82. HEENT:  Unremarkable. NECK:  Supple without masses. LUNGS:  Clear. CARDIOVASCULAR:  Regular rate and rhythm without murmurs,  rubs, or gallops. BREASTS:  Without masses. ABDOMEN:  Soft, flat, nontender. PELVIC:  Vulva, vagina, cervix normal.  Small cervical polyp was noted. Uterus was upper limit of normal size, mid position.  Adnexa unremarkable. EXTREMITIES:  Unremarkable. NEUROLOGIC:  Unremarkable.  IMPRESSION:  Postmenopausal bleeding.  PLAN:  D and C, hysteroscopy with MyoSure.  Procedure and risks discussed as above.     Jonael Paradiso M. Matthew Saras, M.D.   ______________________________ Ralene Bathe. Matthew Saras, M.D.    [facility]/MEDQ  D:  06/15/2016  T:  06/16/2016  Job:  [id number]

## 2016-06-23 DIAGNOSIS — M961 Postlaminectomy syndrome, not elsewhere classified: Secondary | ICD-10-CM | POA: Diagnosis not present

## 2016-06-23 DIAGNOSIS — M1288 Other specific arthropathies, not elsewhere classified, other specified site: Secondary | ICD-10-CM | POA: Diagnosis not present

## 2016-06-23 DIAGNOSIS — G894 Chronic pain syndrome: Secondary | ICD-10-CM | POA: Diagnosis not present

## 2016-06-23 DIAGNOSIS — M47817 Spondylosis without myelopathy or radiculopathy, lumbosacral region: Secondary | ICD-10-CM | POA: Diagnosis not present

## 2016-06-23 DIAGNOSIS — Z79891 Long term (current) use of opiate analgesic: Secondary | ICD-10-CM | POA: Diagnosis not present

## 2016-06-23 DIAGNOSIS — Z79899 Other long term (current) drug therapy: Secondary | ICD-10-CM | POA: Diagnosis not present

## 2016-06-30 ENCOUNTER — Encounter (HOSPITAL_BASED_OUTPATIENT_CLINIC_OR_DEPARTMENT_OTHER): Payer: Self-pay | Admitting: *Deleted

## 2016-07-02 ENCOUNTER — Encounter (HOSPITAL_BASED_OUTPATIENT_CLINIC_OR_DEPARTMENT_OTHER): Payer: Self-pay | Admitting: *Deleted

## 2016-07-02 NOTE — Progress Notes (Signed)
NPO AFTER MN.  ARRIVE AT 0600.  GETTING LAB WORK DONE Monday 07-05-2016 (CBC).  WILL TAKE AM MEDS W/ SIPS OF WATER.

## 2016-07-05 DIAGNOSIS — N950 Postmenopausal bleeding: Secondary | ICD-10-CM | POA: Diagnosis present

## 2016-07-05 DIAGNOSIS — C541 Malignant neoplasm of endometrium: Secondary | ICD-10-CM | POA: Diagnosis not present

## 2016-07-05 DIAGNOSIS — M810 Age-related osteoporosis without current pathological fracture: Secondary | ICD-10-CM | POA: Diagnosis not present

## 2016-07-05 DIAGNOSIS — I10 Essential (primary) hypertension: Secondary | ICD-10-CM | POA: Diagnosis not present

## 2016-07-05 LAB — CBC
HEMATOCRIT: 40.2 % (ref 36.0–46.0)
Hemoglobin: 13.4 g/dL (ref 12.0–15.0)
MCH: 32 pg (ref 26.0–34.0)
MCHC: 33.3 g/dL (ref 30.0–36.0)
MCV: 95.9 fL (ref 78.0–100.0)
Platelets: 249 10*3/uL (ref 150–400)
RBC: 4.19 MIL/uL (ref 3.87–5.11)
RDW: 11.9 % (ref 11.5–15.5)
WBC: 7.7 10*3/uL (ref 4.0–10.5)

## 2016-07-05 NOTE — Anesthesia Preprocedure Evaluation (Addendum)
Anesthesia Evaluation  Patient identified by MRN, date of birth, ID band Patient awake    Reviewed: Allergy & Precautions, NPO status , Patient's Chart, lab work & pertinent test results  History of Anesthesia Complications Negative for: history of anesthetic complications  Airway Mallampati: II  TM Distance: >3 FB Neck ROM: Full    Dental no notable dental hx. (+) Dental Advisory Given   Pulmonary neg pulmonary ROS,    Pulmonary exam normal        Cardiovascular negative cardio ROS Normal cardiovascular exam     Neuro/Psych negative neurological ROS  negative psych ROS   GI/Hepatic negative GI ROS, Neg liver ROS,   Endo/Other  negative endocrine ROS  Renal/GU negative Renal ROS     Musculoskeletal   Abdominal   Peds  Hematology   Anesthesia Other Findings   Reproductive/Obstetrics                            Anesthesia Physical Anesthesia Plan  ASA: II  Anesthesia Plan: General   Post-op Pain Management:    Induction: Intravenous  Airway Management Planned: LMA and Oral ETT  Additional Equipment:   Intra-op Plan:   Post-operative Plan: Extubation in OR  Informed Consent: I have reviewed the patients History and Physical, chart, labs and discussed the procedure including the risks, benefits and alternatives for the proposed anesthesia with the patient or authorized representative who has indicated his/her understanding and acceptance.   Dental advisory given  Plan Discussed with: Anesthesiologist  Anesthesia Plan Comments:        Anesthesia Quick Evaluation

## 2016-07-06 ENCOUNTER — Ambulatory Visit (HOSPITAL_BASED_OUTPATIENT_CLINIC_OR_DEPARTMENT_OTHER)
Admission: RE | Admit: 2016-07-06 | Discharge: 2016-07-06 | Disposition: A | Discharge status: Home / Self Care | Payer: Medicare Other | Source: Ambulatory Visit | Attending: Obstetrics and Gynecology | Admitting: Obstetrics and Gynecology

## 2016-07-06 ENCOUNTER — Ambulatory Visit (HOSPITAL_BASED_OUTPATIENT_CLINIC_OR_DEPARTMENT_OTHER): Payer: Medicare Other | Admitting: Anesthesiology

## 2016-07-06 ENCOUNTER — Encounter (HOSPITAL_BASED_OUTPATIENT_CLINIC_OR_DEPARTMENT_OTHER): Payer: Self-pay

## 2016-07-06 ENCOUNTER — Encounter (HOSPITAL_BASED_OUTPATIENT_CLINIC_OR_DEPARTMENT_OTHER)
Admission: RE | Disposition: A | Discharge status: Home / Self Care | Payer: Self-pay | Source: Ambulatory Visit | Attending: Obstetrics and Gynecology

## 2016-07-06 DIAGNOSIS — M810 Age-related osteoporosis without current pathological fracture: Secondary | ICD-10-CM | POA: Insufficient documentation

## 2016-07-06 DIAGNOSIS — N841 Polyp of cervix uteri: Secondary | ICD-10-CM | POA: Diagnosis not present

## 2016-07-06 DIAGNOSIS — C541 Malignant neoplasm of endometrium: Secondary | ICD-10-CM | POA: Diagnosis not present

## 2016-07-06 DIAGNOSIS — I10 Essential (primary) hypertension: Secondary | ICD-10-CM | POA: Insufficient documentation

## 2016-07-06 DIAGNOSIS — N950 Postmenopausal bleeding: Secondary | ICD-10-CM | POA: Diagnosis not present

## 2016-07-06 HISTORY — DX: Postmenopausal bleeding: N95.0

## 2016-07-06 HISTORY — DX: Other chronic pain: G89.29

## 2016-07-06 HISTORY — DX: Polyp of corpus uteri: N84.0

## 2016-07-06 HISTORY — DX: Spondylosis without myelopathy or radiculopathy, site unspecified: M47.819

## 2016-07-06 HISTORY — DX: Low back pain, unspecified: M54.50

## 2016-07-06 HISTORY — DX: Unspecified osteoarthritis, unspecified site: M19.90

## 2016-07-06 HISTORY — DX: Low back pain: M54.5

## 2016-07-06 HISTORY — DX: Anesthesia of skin: R20.0

## 2016-07-06 HISTORY — DX: Elevated blood-pressure reading, without diagnosis of hypertension: R03.0

## 2016-07-06 HISTORY — DX: Presence of spectacles and contact lenses: Z97.3

## 2016-07-06 HISTORY — PX: HYSTEROSCOPY WITH D & C: SHX1775

## 2016-07-06 HISTORY — DX: Age-related osteoporosis without current pathological fracture: M81.0

## 2016-07-06 SURGERY — DILATATION AND CURETTAGE /HYSTEROSCOPY
Anesthesia: General | Site: Vagina

## 2016-07-06 MED ORDER — MIDAZOLAM HCL 2 MG/2ML IJ SOLN
INTRAMUSCULAR | Status: AC
  Filled 2016-07-06: qty 2

## 2016-07-06 MED ORDER — LIDOCAINE HCL 1 % IJ SOLN
INTRAMUSCULAR | Status: DC | PRN
  Administered 2016-07-06: 8 mL

## 2016-07-06 MED ORDER — DEXTROSE 5 % IV SOLN
2.0000 g | INTRAVENOUS | Status: AC
  Administered 2016-07-06: 2 g via INTRAVENOUS
  Filled 2016-07-06: qty 2

## 2016-07-06 MED ORDER — DEXAMETHASONE SODIUM PHOSPHATE 4 MG/ML IJ SOLN
INTRAMUSCULAR | Status: DC | PRN
  Administered 2016-07-06: 10 mg via INTRAVENOUS

## 2016-07-06 MED ORDER — HYDROMORPHONE HCL 1 MG/ML IJ SOLN
INTRAMUSCULAR | Status: AC
  Filled 2016-07-06: qty 1

## 2016-07-06 MED ORDER — PROPOFOL 10 MG/ML IV BOLUS
INTRAVENOUS | Status: AC
  Filled 2016-07-06: qty 20

## 2016-07-06 MED ORDER — KETOROLAC TROMETHAMINE 30 MG/ML IJ SOLN
INTRAMUSCULAR | Status: AC
  Filled 2016-07-06: qty 1

## 2016-07-06 MED ORDER — LIDOCAINE HCL (CARDIAC) 20 MG/ML IV SOLN
INTRAVENOUS | Status: AC
  Filled 2016-07-06: qty 5

## 2016-07-06 MED ORDER — FENTANYL CITRATE (PF) 100 MCG/2ML IJ SOLN
INTRAMUSCULAR | Status: DC | PRN
Start: 2016-07-06 — End: 2016-07-06
  Administered 2016-07-06 (×4): 25 ug via INTRAVENOUS

## 2016-07-06 MED ORDER — SODIUM CHLORIDE 0.9 % IR SOLN
Status: DC | PRN
  Administered 2016-07-06: 3000 mL

## 2016-07-06 MED ORDER — KETOROLAC TROMETHAMINE 30 MG/ML IJ SOLN
15.0000 mg | Freq: Once | INTRAMUSCULAR | Status: AC | PRN
Start: 2016-07-06 — End: 2016-07-06
  Administered 2016-07-06: 15 mg via INTRAVENOUS
  Filled 2016-07-06: qty 1

## 2016-07-06 MED ORDER — FENTANYL CITRATE (PF) 100 MCG/2ML IJ SOLN
INTRAMUSCULAR | Status: AC
  Filled 2016-07-06: qty 2

## 2016-07-06 MED ORDER — CEFOTETAN DISODIUM-DEXTROSE 2-2.08 GM-% IV SOLR
INTRAVENOUS | Status: AC
  Filled 2016-07-06: qty 50

## 2016-07-06 MED ORDER — DEXAMETHASONE SODIUM PHOSPHATE 10 MG/ML IJ SOLN
INTRAMUSCULAR | Status: AC
  Filled 2016-07-06: qty 1

## 2016-07-06 MED ORDER — LIDOCAINE 2% (20 MG/ML) 5 ML SYRINGE
INTRAMUSCULAR | Status: DC | PRN
  Administered 2016-07-06: 100 mg via INTRAVENOUS

## 2016-07-06 MED ORDER — HYDROMORPHONE HCL 1 MG/ML IJ SOLN
0.2500 mg | INTRAMUSCULAR | Status: DC | PRN
  Administered 2016-07-06 (×2): 0.5 mg via INTRAVENOUS
  Filled 2016-07-06: qty 1

## 2016-07-06 MED ORDER — SCOPOLAMINE 1 MG/3DAYS TD PT72
1.0000 | MEDICATED_PATCH | TRANSDERMAL | Status: DC
Start: 2016-07-06 — End: 2016-07-06
  Administered 2016-07-06: 1.5 mg via TRANSDERMAL
  Filled 2016-07-06: qty 1

## 2016-07-06 MED ORDER — PROPOFOL 500 MG/50ML IV EMUL
INTRAVENOUS | Status: AC
  Filled 2016-07-06: qty 50

## 2016-07-06 MED ORDER — ONDANSETRON HCL 4 MG/2ML IJ SOLN
INTRAMUSCULAR | Status: DC | PRN
  Administered 2016-07-06: 4 mg via INTRAVENOUS

## 2016-07-06 MED ORDER — PROMETHAZINE HCL 25 MG/ML IJ SOLN
6.2500 mg | INTRAMUSCULAR | Status: DC | PRN
  Filled 2016-07-06: qty 1

## 2016-07-06 MED ORDER — PROPOFOL 10 MG/ML IV BOLUS
INTRAVENOUS | Status: DC | PRN
  Administered 2016-07-06: 200 mg via INTRAVENOUS

## 2016-07-06 MED ORDER — MIDAZOLAM HCL 5 MG/5ML IJ SOLN
INTRAMUSCULAR | Status: DC | PRN
  Administered 2016-07-06: 2 mg via INTRAVENOUS

## 2016-07-06 MED ORDER — LACTATED RINGERS IV SOLN
INTRAVENOUS | Status: DC
  Administered 2016-07-06 (×2): via INTRAVENOUS
  Filled 2016-07-06: qty 1000

## 2016-07-06 MED ORDER — ONDANSETRON HCL 4 MG/2ML IJ SOLN
INTRAMUSCULAR | Status: AC
  Filled 2016-07-06: qty 2

## 2016-07-06 MED ORDER — SCOPOLAMINE 1 MG/3DAYS TD PT72
MEDICATED_PATCH | TRANSDERMAL | Status: AC
  Filled 2016-07-06: qty 1

## 2016-07-06 MED ORDER — KETOROLAC TROMETHAMINE 30 MG/ML IJ SOLN
INTRAMUSCULAR | Status: DC | PRN
  Administered 2016-07-06: 30 mg via INTRAVENOUS

## 2016-07-06 SURGICAL SUPPLY — 33 items
CANISTER SUCTION 2500CC (MISCELLANEOUS) ×4 IMPLANT
CATH ROBINSON RED A/P 16FR (CATHETERS) ×4 IMPLANT
COVER BACK TABLE 60X90IN (DRAPES) ×4 IMPLANT
DEVICE MYOSURE LITE (MISCELLANEOUS) IMPLANT
DEVICE MYOSURE REACH (MISCELLANEOUS) IMPLANT
DRAPE HYSTEROSCOPY (DRAPE) ×4 IMPLANT
DRAPE LG THREE QUARTER DISP (DRAPES) ×4 IMPLANT
DRSG TELFA 3X8 NADH (GAUZE/BANDAGES/DRESSINGS) IMPLANT
FILTER ARTHROSCOPY CONVERTOR (FILTER) ×1 IMPLANT
GLOVE BIO SURGEON STRL SZ 6.5 (GLOVE) ×2 IMPLANT
GLOVE BIO SURGEON STRL SZ7 (GLOVE) ×8 IMPLANT
GLOVE BIO SURGEONS STRL SZ 6.5 (GLOVE) ×1
GLOVE BIOGEL PI IND STRL 6.5 (GLOVE) ×1 IMPLANT
GLOVE BIOGEL PI IND STRL 7.5 (GLOVE) ×1 IMPLANT
GLOVE BIOGEL PI INDICATOR 6.5 (GLOVE) ×2
GLOVE BIOGEL PI INDICATOR 7.5 (GLOVE) ×2
GOWN STRL REUS W/TWL LRG LVL3 (GOWN DISPOSABLE) ×8 IMPLANT
IV NS IRRIG 3000ML ARTHROMATIC (IV SOLUTION) ×4 IMPLANT
KIT ROOM TURNOVER WOR (KITS) ×4 IMPLANT
LEGGING LITHOTOMY PAIR STRL (DRAPES) ×4 IMPLANT
MANIFOLD NEPTUNE II (INSTRUMENTS) IMPLANT
PACK BASIN DAY SURGERY FS (CUSTOM PROCEDURE TRAY) ×4 IMPLANT
PAD DRESSING TELFA 3X8 NADH (GAUZE/BANDAGES/DRESSINGS) IMPLANT
PAD OB MATERNITY 4.3X12.25 (PERSONAL CARE ITEMS) ×4 IMPLANT
PAD PREP 24X48 CUFFED NSTRL (MISCELLANEOUS) ×4 IMPLANT
SEAL ROD LENS SCOPE MYOSURE (ABLATOR) ×4 IMPLANT
TOWEL OR 17X24 6PK STRL BLUE (TOWEL DISPOSABLE) ×8 IMPLANT
TRAY DSU PREP LF (CUSTOM PROCEDURE TRAY) ×4 IMPLANT
TUBE CONNECTING 12'X1/4 (SUCTIONS)
TUBE CONNECTING 12X1/4 (SUCTIONS) IMPLANT
TUBING AQUILEX INFLOW (TUBING) ×4 IMPLANT
TUBING AQUILEX OUTFLOW (TUBING) ×4 IMPLANT
WATER STERILE IRR 500ML POUR (IV SOLUTION) ×4 IMPLANT

## 2016-07-06 NOTE — Discharge Instructions (Signed)

## 2016-07-06 NOTE — Anesthesia Procedure Notes (Signed)
Procedure Name: LMA Insertion Date/Time: 07/06/2016 7:37 AM Performed by: Justice Rocher Pre-anesthesia Checklist: Patient identified, Emergency Drugs available, Suction available and Patient being monitored Patient Re-evaluated:Patient Re-evaluated prior to inductionOxygen Delivery Method: Circle system utilized Preoxygenation: Pre-oxygenation with 100% oxygen Intubation Type: IV induction Ventilation: Mask ventilation without difficulty LMA: LMA inserted LMA Size: 4.0 Number of attempts: 1 Airway Equipment and Method: Bite block Placement Confirmation: positive ETCO2 Tube secured with: Tape Dental Injury: Teeth and Oropharynx as per pre-operative assessment

## 2016-07-06 NOTE — Transfer of Care (Signed)
Immediate Anesthesia Transfer of Care Note  Patient: [name]  Procedure(s) Performed: Procedure(s) (LRB): DILATATION AND CURETTAGE /HYSTEROSCOPY (N/A)  Patient Location: PACU  Anesthesia Type: General  Level of Consciousness: awake, sedated, patient cooperative and responds to stimulation  Airway & Oxygen Therapy: Patient Spontanous Breathing and Patient connected to [place] oxygen  Post-op Assessment: Report given to PACU RN, Post -op Vital signs reviewed and stable and Patient moving all extremities  Post vital signs: Reviewed and stable  Complications: No apparent anesthesia complications

## 2016-07-06 NOTE — Op Note (Signed)
Preoperative diagnosis: Postmenopausal bleeding  Postoperative diagnosis: Same  Procedure: Hysteroscopy, D&C  Surgeon: Matthew Saras  Anesthesia: Gen.  EBL: Less than 50 cc  Procedure and findings:  Patient taken the operating room after an adequate level of general anesthesia was obtained with the patient's legs in stirrups the perineum and vagina were prepped and draped the bladder was drained. Appropriate timeouts were taken at that point. EUA revealed that he the uterus was midposition, normal size, adnexa negative. Speculum was positioned cervix was grasped with a tenaculum paracervical block was then created by infiltrating at 3 and 9:00 submucosally, 5-7 cc of 1% plain Xylocaine at each site after negative aspiration. The uterus is then sounded to 7-8 cm, progressively dilated to a 24 Pratt dilator. Continuous flow hysteroscope was then positioned the upper fundus was unremarkable there was some buildup of tissue at the internal os and along the cervix. Sharp curettage was carried out from the fundus and then more specifically at the area of the cervix, small moderate amount of tissue was removed all sent to pathology.  The scope was reinserted and the cavity was irrigated and the view was much improved, the fundus again was unremarkable with some shaggy endometrium noted at the lower fundus but the tissue buildup been largely removed. There was minimal bleeding she tolerated this well went to recovery room in good condition.  Dictated with [place].

## 2016-07-06 NOTE — Anesthesia Postprocedure Evaluation (Signed)
Anesthesia Post Note  Patient: [name]  Procedure(s) Performed: Procedure(s) (LRB): DILATATION AND CURETTAGE /HYSTEROSCOPY (N/A)  Patient location during evaluation: PACU Anesthesia Type: General Level of consciousness: sedated Pain management: pain level controlled Vital Signs Assessment: post-procedure vital signs reviewed and stable Respiratory status: spontaneous breathing and respiratory function stable Cardiovascular status: stable Anesthetic complications: no    Last Vitals:  Vitals:   07/06/16 0845 07/06/16 0900  BP: 109/72 109/73  Pulse: 85 73  Resp: 13 12  Temp:      Last Pain:  Vitals:   07/06/16 0900  TempSrc:   PainSc: 3                  Sonali Wivell DANIEL

## 2016-07-07 ENCOUNTER — Encounter (HOSPITAL_BASED_OUTPATIENT_CLINIC_OR_DEPARTMENT_OTHER): Payer: Self-pay | Admitting: Obstetrics and Gynecology

## 2016-07-20 DIAGNOSIS — Z09 Encounter for follow-up examination after completed treatment for conditions other than malignant neoplasm: Secondary | ICD-10-CM | POA: Diagnosis not present

## 2016-07-20 DIAGNOSIS — N390 Urinary tract infection, site not specified: Secondary | ICD-10-CM | POA: Diagnosis not present

## 2016-07-21 DIAGNOSIS — G894 Chronic pain syndrome: Secondary | ICD-10-CM | POA: Diagnosis not present

## 2016-07-21 DIAGNOSIS — M961 Postlaminectomy syndrome, not elsewhere classified: Secondary | ICD-10-CM | POA: Diagnosis not present

## 2016-07-21 DIAGNOSIS — M47817 Spondylosis without myelopathy or radiculopathy, lumbosacral region: Secondary | ICD-10-CM | POA: Diagnosis not present

## 2016-07-21 DIAGNOSIS — Z79891 Long term (current) use of opiate analgesic: Secondary | ICD-10-CM | POA: Diagnosis not present

## 2016-07-21 DIAGNOSIS — Z79899 Other long term (current) drug therapy: Secondary | ICD-10-CM | POA: Diagnosis not present

## 2016-07-21 DIAGNOSIS — M1288 Other specific arthropathies, not elsewhere classified, other specified site: Secondary | ICD-10-CM | POA: Diagnosis not present

## 2016-07-28 ENCOUNTER — Encounter: Payer: Self-pay | Admitting: Gynecologic Oncology

## 2016-07-28 ENCOUNTER — Ambulatory Visit: Payer: Medicare Other | Attending: Gynecologic Oncology | Admitting: Gynecologic Oncology

## 2016-07-28 VITALS — BP 141/96 | HR 84 | Temp 98.3°F | Resp 18 | Ht 62.0 in | Wt 147.3 lb

## 2016-07-28 DIAGNOSIS — N840 Polyp of corpus uteri: Secondary | ICD-10-CM | POA: Diagnosis not present

## 2016-07-28 DIAGNOSIS — G8929 Other chronic pain: Secondary | ICD-10-CM

## 2016-07-28 DIAGNOSIS — Z806 Family history of leukemia: Secondary | ICD-10-CM | POA: Insufficient documentation

## 2016-07-28 DIAGNOSIS — M810 Age-related osteoporosis without current pathological fracture: Secondary | ICD-10-CM | POA: Diagnosis not present

## 2016-07-28 DIAGNOSIS — Z79891 Long term (current) use of opiate analgesic: Secondary | ICD-10-CM | POA: Diagnosis not present

## 2016-07-28 DIAGNOSIS — M479 Spondylosis, unspecified: Secondary | ICD-10-CM | POA: Insufficient documentation

## 2016-07-28 DIAGNOSIS — Z800 Family history of malignant neoplasm of digestive organs: Secondary | ICD-10-CM | POA: Insufficient documentation

## 2016-07-28 DIAGNOSIS — N950 Postmenopausal bleeding: Secondary | ICD-10-CM | POA: Insufficient documentation

## 2016-07-28 DIAGNOSIS — Z808 Family history of malignant neoplasm of other organs or systems: Secondary | ICD-10-CM | POA: Diagnosis not present

## 2016-07-28 DIAGNOSIS — C541 Malignant neoplasm of endometrium: Secondary | ICD-10-CM | POA: Insufficient documentation

## 2016-07-28 NOTE — Progress Notes (Signed)
Consult Note: Gyn-Onc  Consult was requested by Dr. Matthew Saras for the evaluation of [name] 65 y.o. female  CC:  Chief Complaint  Patient presents with  . endometrial cancer    new consultation    Assessment/Plan:  [name]  is a 65 y.o.  year old with grade 2 endometrial cancer and a strong family history of cancer. She also has a history of chronic pain and opiate use secondary to a spine fracture.   A detailed discussion was held with the patient and her family with regard to to her endometrial cancer diagnosis. We discussed the standard management options for uterine cancer which includes surgery followed possibly by adjuvant therapy depending on the results of surgery. The options for surgical management include a hysterectomy and removal of the tubes and ovaries possibly with removal of pelvic and para-aortic lymph nodes. A minimally invasive approach including a robotic hysterectomy or laparoscopic hysterectomy have benefits including shorter hospital stay, recovery time and better wound healing. The alternative approach is an open hysterectomy. The patient has been counseled about these surgical options and the risks of surgery in general including infection, bleeding, damage to surrounding structures (including bowel, bladder, ureters, nerves or vessels), and the postoperative risks of PE/ DVT, and lymphedema. I extensively reviewed the additional risks of robotic hysterectomy including possible need for conversion to open laparotomy.  I discussed positioning during surgery of trendelenberg and risks of minor facial swelling and care we take in preoperative positioning.  After counseling and consideration of her options, she desires to proceed with robotic hysterectomy/BSO/SLN biopsy.   She requires evaluation by genetic counselors due to her strong family history of malignancy and concern for lynch syndrome.  She has chronic back pain, and left leg weakness and  paresthesia secondary to her spinal injury. I discussed she has increased risk for opiate use and poor pain control. I will plan on perioperative gabapentin and aggressive non-opiate strategies.  We will review her op note to determine if peritoneal entry was required for her spinal repair. I discussed with the patient that if so, this increases her risk for intraoperative visceral injury.  She will be seen by anesthesia for preoperative clearance and discussion of postoperative pain management.  She was given the opportunity to ask questions, which were answered to her satisfaction, and she is agreement with the above mentioned plan of care.  HPI: [name] is a 65 year old G3P3 who is seen in consultation at the request of Dr Matthew Saras for grade 2 endometrial cancer.   The patient reports a history of postmenopausal bleeding since June 2017. She underwent a sonohysterogram in Dr. Delanna Ahmadi office on 06/01/2016 that demonstrated a normal sized uterus measuring 6.5 x 2.6 x 2.6 cm but with an endometrial polyp measuring 2.4 cm. She was taken to the operating room on 07/06/2016 for a minor sure resection of the polyp. Final pathology from this revealed a grade 2 endometrioid adenocarcinoma.  The patient is otherwise a fairly healthy woman. Her major medical history significant for a horseback riding accident from which she required repair of L1-2 and 3 through a lateral approach on the left. She has a flank incision from this. She has persistent left lower extremity weakness, and paresthesia secondary to this. She also has chronic opiate use and takes Percocet 7.5 mg 3 times a day and Neurontin 3 times a day due to this pain.  She is a striking family history for malignancy. Her son died  at age 32 of sarcoma. Her sister has a history of leukemia, and her mother also died of leukemia. She is maternal grandmother with a history of colon cancer and a maternal great aunt with history of colon cancer.    Current Meds:  Outpatient Encounter Prescriptions as of 07/28/2016  Medication Sig  . gabapentin (NEURONTIN) 300 MG capsule Take 300 mg by mouth 4 (four) times daily.  Marland Kitchen oxyCODONE-acetaminophen (PERCOCET) 7.5-325 MG tablet Take 1-2 tablets by mouth every 8 (eight) hours as needed for severe pain.   No facility-administered encounter medications on file as of 07/28/2016.     Allergy: No Known Allergies  Social Hx:   Social History   Social History  . Marital status: Divorced    Spouse name: N/A  . Number of children: N/A  . Years of education: N/A   Occupational History  . Not on file.   Social History Main Topics  . Smoking status: Never Smoker  . Smokeless tobacco: Never Used  . Alcohol use No  . Drug use: No  . Sexual activity: Not on file   Other Topics Concern  . Not on file   Social History Narrative  . No narrative on file    Past Surgical Hx:  Past Surgical History:  Procedure Laterality Date  . HYSTEROSCOPY W/D&C N/A 07/06/2016   Procedure: DILATATION AND CURETTAGE /HYSTEROSCOPY;  Surgeon: Molli Posey, MD;  Location: [facility];  Service: Gynecology;  Laterality: N/A;  . INCISION AND DRAINAGE BREAST ABSCESS Left 1977  . LUMBAR FUSION  2007   L1 -- L3    Past Medical Hx:  Past Medical History:  Diagnosis Date  . Arthritis    HANDS  . Borderline hypertension   . Chronic low back pain   . Degenerative spinal arthritis   . Endometrial polyp   . Numbness in left leg    DUE TO BACK INJURY 2007  . Osteoporosis   . PMB (postmenopausal bleeding)   . Wears glasses     Past Gynecological History:  G3P3 SVD'x No LMP recorded. Patient is postmenopausal.  Family Hx:  Family History  Problem Relation Age of Onset  . Cancer Mother     leukemia    Review of Systems:  Constitutional  Feels well,    ENT Normal appearing ears and nares bilaterally Skin/Breast  No rash, sores, jaundice, itching, dryness Cardiovascular  No chest  pain, shortness of breath, or edema  Pulmonary  No cough or wheeze.  Gastro Intestinal  No nausea, vomitting, or diarrhoea. No bright red blood per rectum, no abdominal pain, change in bowel movement, or constipation.  Genito Urinary  No frequency, urgency, dysuria, + postmenopausal bleeding Musculo Skeletal  No myalgia, arthralgia, joint swelling or pain  Neurologic  No weakness, numbness, change in gait,  Psychology  No depression, anxiety, insomnia.   Vitals:  Blood pressure (!) 141/96, pulse 84, temperature 98.3 F (36.8 C), temperature source Oral, resp. rate 18, height 5\' 2"  (1.575 m), weight 147 lb 4.8 oz (66.8 kg), SpO2 98 %.  Physical Exam: WD in NAD Neck  Supple NROM, without any enlargements.  Lymph Node Survey No cervical supraclavicular or inguinal adenopathy Cardiovascular  Pulse normal rate, regularity and rhythm. S1 and S2 normal.  Lungs  Clear to auscultation bilateraly, without wheezes/crackles/rhonchi. Good air movement.  Skin  No rash/lesions/breakdown  Psychiatry  Alert and oriented to person, place, and time  Abdomen  Normoactive bowel sounds, abdomen soft, non-tender and nonobese without  evidence of hernia.  Back No CVA tenderness Genito Urinary  Vulva/vagina: Normal external female genitalia.  No lesions. No discharge or bleeding.  Bladder/urethra:  No lesions or masses, well supported bladder  Vagina: normal  Cervix: Normal appearing, no lesions.  Uterus:  Small, mobile, no parametrial involvement or nodularity.  Adnexa: no palpable masses. Rectal  deferred Extremities  No bilateral cyanosis, clubbing or edema.   [name], MD  07/28/2016, 10:20 AM

## 2016-07-28 NOTE — Patient Instructions (Signed)
Preparing for your Surgery  Plan for surgery on September 14 , 2017  with Dr. Adolphus Birchwood . Robotic hysterectomy, bilateral salpingo-oophorectomy,sentinal lymph node mapping   Pre-operative Testing -You will receive a phone call from presurgical testing at [facility] to arrange for a pre-operative testing appointment before your surgery.  This appointment normally occurs one to two weeks before your scheduled surgery.   -Bring your insurance card, copy of an advanced directive if applicable, medication list  -At that visit, you will be asked to sign a consent for a possible blood transfusion in case a transfusion becomes necessary during surgery.  The need for a blood transfusion is rare but having consent is a necessary part of your care.     -You should not be taking blood thinners or aspirin at least ten days prior to surgery unless instructed by your surgeon.  Day Before Surgery at Home -You will be asked to take in a light diet the day before surgery.  Avoid carbonated beverages.  You will be advised to have nothing to eat or drink after midnight the evening before.     Eat a light diet the day before surgery.  Examples including soups, broths,  toast, yogurt, mashed potatoes.  Things to avoid include carbonated beverages  (fizzy beverages), raw fruits and raw vegetables, or beans.    If your bowels are filled with gas, your surgeon will have difficulty  visualizing your pelvic organs which increases your surgical risks.  Your role in recovery Your role is to become active as soon as directed by your doctor, while still giving yourself time to heal.  Rest when you feel tired. You will be asked to do the following in order to speed your recovery:  - Cough and breathe deeply. This helps toclear and expand your lungs and can prevent pneumonia. You may be given a spirometer to practice deep breathing. A staff member will show you how to use the spirometer. - Do mild  physical activity. Walking or moving your legs help your circulation and body functions return to normal. A staff member will help you when you try to walk and will provide you with simple exercises. Do not try to get up or walk alone the first time. - Actively manage your pain. Managing your pain lets you move in comfort. We will ask you to rate your pain on a scale of zero to 10. It is your responsibility to tell your doctor or nurse where and how much you hurt so your pain can be treated.  Special Considerations -If you are diabetic, you may be placed on insulin after surgery to have closer control over your blood sugars to promote healing and recovery.  This does not mean that you will be discharged on insulin.  If applicable, your oral antidiabetics will be resumed when you are tolerating a solid diet.  -Your final pathology results from surgery should be available by the Friday after surgery and the results will be relayed to you when available.   Blood Transfusion Information WHAT IS A BLOOD TRANSFUSION? A transfusion is the replacement of blood or some of its parts. Blood is made up of multiple cells which provide different functions.  Red blood cells carry oxygen and are used for blood loss replacement.  White blood cells fight against infection.  Platelets control bleeding.  Plasma helps clot blood.  Other blood products are available for specialized needs, such as hemophilia or other clotting disorders. BEFORE THE TRANSFUSION  Who gives blood for transfusions?   You may be able to donate blood to be used at a later date on yourself (autologous donation).  Relatives can be asked to donate blood. This is generally not any safer than if you have received blood from a stranger. The same precautions are taken to ensure safety when a relative's blood is donated.  Healthy volunteers who are fully evaluated to make sure their blood is safe. This is blood bank blood. Transfusion  therapy is the safest it has ever been in the practice of medicine. Before blood is taken from a donor, a complete history is taken to make sure that person has no history of diseases nor engages in risky social behavior (examples are intravenous drug use or sexual activity with multiple partners). The donor's travel history is screened to minimize risk of transmitting infections, such as malaria. The donated blood is tested for signs of infectious diseases, such as HIV and hepatitis. The blood is then tested to be sure it is compatible with you in order to minimize the chance of a transfusion reaction. If you or a relative donates blood, this is often done in anticipation of surgery and is not appropriate for emergency situations. It takes many days to process the donated blood. RISKS AND COMPLICATIONS Although transfusion therapy is very safe and saves many lives, the main dangers of transfusion include:   Getting an infectious disease.  Developing a transfusion reaction. This is an allergic reaction to something in the blood you were given. Every precaution is taken to prevent this. The decision to have a blood transfusion has been considered carefully by your caregiver before blood is given. Blood is not given unless the benefits outweigh the risks.

## 2016-08-03 ENCOUNTER — Encounter (HOSPITAL_COMMUNITY)
Admission: RE | Admit: 2016-08-03 | Discharge: 2016-08-03 | Disposition: A | Discharge status: Home / Self Care | Payer: Medicare Other | Source: Ambulatory Visit | Attending: Gynecologic Oncology | Admitting: Gynecologic Oncology

## 2016-08-03 ENCOUNTER — Encounter (HOSPITAL_COMMUNITY): Payer: Self-pay

## 2016-08-03 ENCOUNTER — Encounter (INDEPENDENT_AMBULATORY_CARE_PROVIDER_SITE_OTHER): Payer: Self-pay

## 2016-08-03 ENCOUNTER — Ambulatory Visit (HOSPITAL_COMMUNITY)
Admission: RE | Admit: 2016-08-03 | Discharge: 2016-08-03 | Disposition: A | Discharge status: Home / Self Care | Payer: Medicare Other | Source: Ambulatory Visit | Attending: Gynecologic Oncology | Admitting: Gynecologic Oncology

## 2016-08-03 DIAGNOSIS — R918 Other nonspecific abnormal finding of lung field: Secondary | ICD-10-CM | POA: Insufficient documentation

## 2016-08-03 DIAGNOSIS — Z01810 Encounter for preprocedural cardiovascular examination: Secondary | ICD-10-CM | POA: Diagnosis not present

## 2016-08-03 DIAGNOSIS — Z01818 Encounter for other preprocedural examination: Secondary | ICD-10-CM | POA: Insufficient documentation

## 2016-08-03 DIAGNOSIS — C541 Malignant neoplasm of endometrium: Secondary | ICD-10-CM

## 2016-08-03 HISTORY — DX: Pneumonia, unspecified organism: J18.9

## 2016-08-03 HISTORY — DX: Family history of other specified conditions: Z84.89

## 2016-08-03 HISTORY — DX: Malignant (primary) neoplasm, unspecified: C80.1

## 2016-08-03 LAB — CBC WITH DIFFERENTIAL/PLATELET
BASOS ABS: 0.1 10*3/uL (ref 0.0–0.1)
Basophils Relative: 1 %
Eosinophils Absolute: 0.1 10*3/uL (ref 0.0–0.7)
Eosinophils Relative: 2 %
HEMATOCRIT: 39.4 % (ref 36.0–46.0)
Hemoglobin: 13 g/dL (ref 12.0–15.0)
LYMPHS ABS: 2.4 10*3/uL (ref 0.7–4.0)
LYMPHS PCT: 37 %
MCH: 31.6 pg (ref 26.0–34.0)
MCHC: 33 g/dL (ref 30.0–36.0)
MCV: 95.9 fL (ref 78.0–100.0)
MONO ABS: 0.6 10*3/uL (ref 0.1–1.0)
Monocytes Relative: 9 %
NEUTROS ABS: 3.4 10*3/uL (ref 1.7–7.7)
Neutrophils Relative %: 51 %
Platelets: 288 10*3/uL (ref 150–400)
RBC: 4.11 MIL/uL (ref 3.87–5.11)
RDW: 12.3 % (ref 11.5–15.5)
WBC: 6.6 10*3/uL (ref 4.0–10.5)

## 2016-08-03 LAB — COMPREHENSIVE METABOLIC PANEL
ALT: 15 U/L (ref 14–54)
AST: 23 U/L (ref 15–41)
Albumin: 4.6 g/dL (ref 3.5–5.0)
Alkaline Phosphatase: 84 U/L (ref 38–126)
Anion gap: 9 (ref 5–15)
BILIRUBIN TOTAL: 0.7 mg/dL (ref 0.3–1.2)
BUN: 10 mg/dL (ref 6–20)
CO2: 26 mmol/L (ref 22–32)
CREATININE: 0.82 mg/dL (ref 0.44–1.00)
Calcium: 9.4 mg/dL (ref 8.9–10.3)
Chloride: 103 mmol/L (ref 101–111)
Glucose, Bld: 103 mg/dL — ABNORMAL HIGH (ref 65–99)
Potassium: 4.2 mmol/L (ref 3.5–5.1)
Sodium: 138 mmol/L (ref 135–145)
TOTAL PROTEIN: 7.5 g/dL (ref 6.5–8.1)

## 2016-08-03 LAB — URINALYSIS, ROUTINE W REFLEX MICROSCOPIC
Bilirubin Urine: NEGATIVE
GLUCOSE, UA: NEGATIVE mg/dL
Ketones, ur: NEGATIVE mg/dL
Nitrite: NEGATIVE
PH: 5 (ref 5.0–8.0)
Protein, ur: NEGATIVE mg/dL
SPECIFIC GRAVITY, URINE: 1.008 (ref 1.005–1.030)

## 2016-08-03 LAB — URINE MICROSCOPIC-ADD ON

## 2016-08-03 LAB — ABO/RH: ABO/RH(D): B POS

## 2016-08-03 NOTE — Progress Notes (Signed)
CHEST XRAY AND MICRO UA RESULTS ROUTED TO [facility] CROSS NO EPIC INBASKET

## 2016-08-03 NOTE — Patient Instructions (Signed)
[name]  08/03/2016   Your procedure is scheduled on: 08-05-16  Report to [facility]  Entrance take [place]  elevators to 3rd floor to  [place] at [address].  Call this number if you have problems the morning of surgery [phone]   Remember: ONLY 1 PERSON MAY GO WITH YOU TO SHORT STAY TO GET  READY MORNING OF YOUR SURGERY.  Do not eat food or drink liquids :After Midnight.     Take these medicines the morning of surgery with A SIP OF WATER: GABAPENTIN (NEURONTIN), OXYCODONE IF NEEDED              You may not have any metal on your body including hair pins and              piercings  Do not wear jewelry, make-up, lotions, powders or perfumes, deodorant             Do not wear nail polish.  Do not shave  48 hours prior to surgery.              Men may shave face and neck.   Do not bring valuables to the hospital. [place].  Contacts, dentures or bridgework may not be worn into surgery.  Leave suitcase in the car. After surgery it may be brought to your room.     Patients discharged the day of surgery will not be allowed to drive home.  Name and phone number of your driver:  Special Instructions: N/A              Please read over the following fact sheets you were given: _____________________________________________________________________             Eat a light diet the day before surgery.  Examples including soups, broths, toast, yogurt, mashed potatoes.  Things to avoid include carbonated beverages (fizzy beverages), raw fruits and raw vegetables, or beans.   If your bowels are filled with gas, your surgeon will have difficulty visualizing your pelvic organs which increases your surgical risks.[place] - Preparing for Surgery Before surgery, you can play an important role.  Because skin is not sterile, your skin needs to be as free of germs as possible.  You can reduce the number of  germs on your skin by washing with CHG (chlorahexidine gluconate) soap before surgery.  CHG is an antiseptic cleaner which kills germs and bonds with the skin to continue killing germs even after washing. Please DO NOT use if you have an allergy to CHG or antibacterial soaps.  If your skin becomes reddened/irritated stop using the CHG and inform your nurse when you arrive at Short Stay. Do not shave (including legs and underarms) for at least 48 hours prior to the first CHG shower.  You may shave your face/neck. Please follow these instructions carefully:  1.  Shower with CHG Soap the night before surgery and the  morning of Surgery.  2.  If you choose to wash your hair, wash your hair first as usual with your  normal  shampoo.  3.  After you shampoo, rinse your hair and body thoroughly to remove the  shampoo.  4.  Use CHG as you would any other liquid soap.  You can apply chg directly  to the skin and wash                       Gently with a scrungie or clean washcloth.  5.  Apply the CHG Soap to your body ONLY FROM THE NECK DOWN.   Do not use on face/ open                           Wound or open sores. Avoid contact with eyes, ears mouth and genitals (private parts).                       Wash face,  Genitals (private parts) with your normal soap.             6.  Wash thoroughly, paying special attention to the area where your surgery  will be performed.  7.  Thoroughly rinse your body with warm water from the neck down.  8.  DO NOT shower/wash with your normal soap after using and rinsing off  the CHG Soap.                9.  Pat yourself dry with a clean towel.            10.  Wear clean pajamas.            11.  Place clean sheets on your bed the night of your first shower and do not  sleep with pets. Day of Surgery : Do not apply any lotions/deodorants the morning of surgery.  Please wear clean clothes to the hospital/surgery center.  FAILURE TO FOLLOW THESE  INSTRUCTIONS MAY RESULT IN THE CANCELLATION OF YOUR SURGERY PATIENT SIGNATURE_________________________________  NURSE SIGNATURE__________________________________  ________________________________________________________________________   Adam Phenix  An incentive spirometer is a tool that can help keep your lungs clear and active. This tool measures how well you are filling your lungs with each breath. Taking long deep breaths may help reverse or decrease the chance of developing breathing (pulmonary) problems (especially infection) following:  A long period of time when you are unable to move or be active. BEFORE THE PROCEDURE   If the spirometer includes an indicator to show your best effort, your nurse or respiratory therapist will set it to a desired goal.  If possible, sit up straight or lean slightly forward. Try not to slouch.  Hold the incentive spirometer in an upright position. INSTRUCTIONS FOR USE  1. Sit on the edge of your bed if possible, or sit up as far as you can in bed or on a chair. 2. Hold the incentive spirometer in an upright position. 3. Breathe out normally. 4. Place the mouthpiece in your mouth and seal your lips tightly around it. 5. Breathe in slowly and as deeply as possible, raising the piston or the ball toward the top of the column. 6. Hold your breath for 3-5 seconds or for as long as possible. Allow the piston or ball to fall to the bottom of the column. 7. Remove the mouthpiece from your mouth and breathe out normally. 8. Rest for a few seconds and repeat Steps 1 through 7 at least 10 times every 1-2 hours when you are awake. Take your time and take a few normal breaths between deep breaths. 9. The spirometer may include an indicator to show  your best effort. Use the indicator as a goal to work toward during each repetition. 10. After each set of 10 deep breaths, practice coughing to be sure your lungs are clear. If you have an incision (the  cut made at the time of surgery), support your incision when coughing by placing a pillow or rolled up towels firmly against it. Once you are able to get out of bed, walk around indoors and cough well. You may stop using the incentive spirometer when instructed by your caregiver.  RISKS AND COMPLICATIONS  Take your time so you do not get dizzy or light-headed.  If you are in pain, you may need to take or ask for pain medication before doing incentive spirometry. It is harder to take a deep breath if you are having pain. AFTER USE  Rest and breathe slowly and easily.  It can be helpful to keep track of a log of your progress. Your caregiver can provide you with a simple table to help with this. If you are using the spirometer at home, follow these instructions: [place] IF:   You are having difficultly using the spirometer.  You have trouble using the spirometer as often as instructed.  Your pain medication is not giving enough relief while using the spirometer.  You develop fever of 100.5 F (38.1 C) or higher. SEEK IMMEDIATE MEDICAL CARE IF:   You cough up bloody sputum that had not been present before.  You develop fever of 102 F (38.9 C) or greater.  You develop worsening pain at or near the incision site. MAKE SURE YOU:   Understand these instructions.  Will watch your condition.  Will get help right away if you are not doing well or get worse. Document Released: 03/21/2007 Document Revised: 01/31/2012 Document Reviewed: 05/22/2007 Exit[employer] Patient Information 2014 Exit[employer], [place].   ________________________________________________________________________  WHAT IS A BLOOD TRANSFUSION? Blood Transfusion Information  A transfusion is the replacement of blood or some of its parts. Blood is made up of multiple cells which provide different functions.  Red blood cells carry oxygen and are used for blood loss replacement.  White blood cells fight against  infection.  Platelets control bleeding.  Plasma helps clot blood.  Other blood products are available for specialized needs, such as hemophilia or other clotting disorders. BEFORE THE TRANSFUSION  Who gives blood for transfusions?   Healthy volunteers who are fully evaluated to make sure their blood is safe. This is blood bank blood. Transfusion therapy is the safest it has ever been in the practice of medicine. Before blood is taken from a donor, a complete history is taken to make sure that person has no history of diseases nor engages in risky social behavior (examples are intravenous drug use or sexual activity with multiple partners). The donor's travel history is screened to minimize risk of transmitting infections, such as malaria. The donated blood is tested for signs of infectious diseases, such as HIV and hepatitis. The blood is then tested to be sure it is compatible with you in order to minimize the chance of a transfusion reaction. If you or a relative donates blood, this is often done in anticipation of surgery and is not appropriate for emergency situations. It takes many days to process the donated blood. RISKS AND COMPLICATIONS Although transfusion therapy is very safe and saves many lives, the main dangers of transfusion include:   Getting an infectious disease.  Developing a transfusion reaction. This is an allergic reaction to  something in the blood you were given. Every precaution is taken to prevent this. The decision to have a blood transfusion has been considered carefully by your caregiver before blood is given. Blood is not given unless the benefits outweigh the risks. AFTER THE TRANSFUSION  Right after receiving a blood transfusion, you will usually feel much better and more energetic. This is especially true if your red blood cells have gotten low (anemic). The transfusion raises the level of the red blood cells which carry oxygen, and this usually causes an energy  increase.  The nurse administering the transfusion will monitor you carefully for complications. HOME CARE INSTRUCTIONS  No special instructions are needed after a transfusion. You may find your energy is better. Speak with your caregiver about any limitations on activity for underlying diseases you may have. SEEK MEDICAL CARE IF:   Your condition is not improving after your transfusion.  You develop redness or irritation at the intravenous (IV) site. SEEK IMMEDIATE MEDICAL CARE IF:  Any of the following symptoms occur over the next 12 hours:  Shaking chills.  You have a temperature by mouth above 102 F (38.9 C), not controlled by medicine.  Chest, back, or muscle pain.  People around you feel you are not acting correctly or are confused.  Shortness of breath or difficulty breathing.  Dizziness and fainting.  You get a rash or develop hives.  You have a decrease in urine output.  Your urine turns a dark color or changes to pink, red, or brown. Any of the following symptoms occur over the next 10 days:  You have a temperature by mouth above 102 F (38.9 C), not controlled by medicine.  Shortness of breath.  Weakness after normal activity.  The white part of the eye turns yellow (jaundice).  You have a decrease in the amount of urine or are urinating less often.  Your urine turns a dark color or changes to pink, red, or brown. Document Released: 11/05/2000 Document Revised: 01/31/2012 Document Reviewed: 06/24/2008 [facility] Patient Information 2014 [place], [place].  _______________________________________________________________________

## 2016-08-05 ENCOUNTER — Ambulatory Visit (HOSPITAL_COMMUNITY): Payer: Medicare Other | Admitting: Anesthesiology

## 2016-08-05 ENCOUNTER — Encounter (HOSPITAL_COMMUNITY)
Admission: RE | Disposition: A | Discharge status: Home / Self Care | Payer: Self-pay | Source: Ambulatory Visit | Attending: Gynecologic Oncology

## 2016-08-05 ENCOUNTER — Encounter (HOSPITAL_COMMUNITY): Payer: Self-pay | Admitting: *Deleted

## 2016-08-05 ENCOUNTER — Observation Stay (HOSPITAL_COMMUNITY)
Admission: RE | Admit: 2016-08-05 | Discharge: 2016-08-06 | Disposition: A | Discharge status: Home / Self Care | Payer: Medicare Other | Source: Ambulatory Visit | Attending: Gynecologic Oncology | Admitting: Gynecologic Oncology

## 2016-08-05 DIAGNOSIS — C530 Malignant neoplasm of endocervix: Secondary | ICD-10-CM | POA: Diagnosis not present

## 2016-08-05 DIAGNOSIS — Z808 Family history of malignant neoplasm of other organs or systems: Secondary | ICD-10-CM | POA: Diagnosis not present

## 2016-08-05 DIAGNOSIS — Z800 Family history of malignant neoplasm of digestive organs: Secondary | ICD-10-CM | POA: Diagnosis not present

## 2016-08-05 DIAGNOSIS — G8929 Other chronic pain: Secondary | ICD-10-CM | POA: Diagnosis not present

## 2016-08-05 DIAGNOSIS — Z23 Encounter for immunization: Secondary | ICD-10-CM | POA: Insufficient documentation

## 2016-08-05 DIAGNOSIS — C541 Malignant neoplasm of endometrium: Secondary | ICD-10-CM | POA: Diagnosis not present

## 2016-08-05 DIAGNOSIS — D4989 Neoplasm of unspecified behavior of other specified sites: Secondary | ICD-10-CM | POA: Diagnosis not present

## 2016-08-05 DIAGNOSIS — Z806 Family history of leukemia: Secondary | ICD-10-CM | POA: Insufficient documentation

## 2016-08-05 DIAGNOSIS — M545 Low back pain: Secondary | ICD-10-CM | POA: Diagnosis not present

## 2016-08-05 DIAGNOSIS — C775 Secondary and unspecified malignant neoplasm of intrapelvic lymph nodes: Secondary | ICD-10-CM | POA: Diagnosis not present

## 2016-08-05 DIAGNOSIS — R531 Weakness: Secondary | ICD-10-CM | POA: Insufficient documentation

## 2016-08-05 DIAGNOSIS — Z79891 Long term (current) use of opiate analgesic: Secondary | ICD-10-CM | POA: Insufficient documentation

## 2016-08-05 DIAGNOSIS — R202 Paresthesia of skin: Secondary | ICD-10-CM | POA: Diagnosis not present

## 2016-08-05 DIAGNOSIS — Z79899 Other long term (current) drug therapy: Secondary | ICD-10-CM | POA: Insufficient documentation

## 2016-08-05 HISTORY — PX: ROBOTIC ASSISTED TOTAL HYSTERECTOMY WITH BILATERAL SALPINGO OOPHERECTOMY: SHX6086

## 2016-08-05 LAB — TYPE AND SCREEN
ABO/RH(D): B POS
Antibody Screen: NEGATIVE

## 2016-08-05 SURGERY — HYSTERECTOMY, TOTAL, ROBOT-ASSISTED, LAPAROSCOPIC, WITH BILATERAL SALPINGO-OOPHORECTOMY
Anesthesia: General | Laterality: Bilateral

## 2016-08-05 MED ORDER — STERILE WATER FOR IRRIGATION IR SOLN
Status: DC | PRN
  Administered 2016-08-05: 1000 mL

## 2016-08-05 MED ORDER — SODIUM CHLORIDE 0.9 % IJ SOLN
INTRAMUSCULAR | Status: AC
  Filled 2016-08-05: qty 10

## 2016-08-05 MED ORDER — CEFAZOLIN SODIUM-DEXTROSE 2-4 GM/100ML-% IV SOLN
INTRAVENOUS | Status: AC
  Filled 2016-08-05: qty 100

## 2016-08-05 MED ORDER — ROCURONIUM BROMIDE 10 MG/ML (PF) SYRINGE
PREFILLED_SYRINGE | INTRAVENOUS | Status: DC | PRN
  Administered 2016-08-05: 20 mg via INTRAVENOUS
  Administered 2016-08-05: 50 mg via INTRAVENOUS

## 2016-08-05 MED ORDER — ENOXAPARIN SODIUM 40 MG/0.4ML ~~LOC~~ SOLN
40.0000 mg | SUBCUTANEOUS | Status: DC
  Administered 2016-08-06: 40 mg via SUBCUTANEOUS
  Filled 2016-08-05: qty 0.4

## 2016-08-05 MED ORDER — DEXAMETHASONE SODIUM PHOSPHATE 10 MG/ML IJ SOLN
INTRAMUSCULAR | Status: AC
  Filled 2016-08-05: qty 1

## 2016-08-05 MED ORDER — GABAPENTIN 300 MG PO CAPS
300.0000 mg | ORAL_CAPSULE | Freq: Four times a day (QID) | ORAL | Status: DC
  Administered 2016-08-05 – 2016-08-06 (×4): 300 mg via ORAL
  Filled 2016-08-05 (×4): qty 1

## 2016-08-05 MED ORDER — PROPOFOL 10 MG/ML IV BOLUS
INTRAVENOUS | Status: AC
  Filled 2016-08-05: qty 20

## 2016-08-05 MED ORDER — HYDROMORPHONE HCL 1 MG/ML IJ SOLN
0.2500 mg | INTRAMUSCULAR | Status: DC | PRN
  Administered 2016-08-05 (×4): 0.5 mg via INTRAVENOUS

## 2016-08-05 MED ORDER — LIDOCAINE 2% (20 MG/ML) 5 ML SYRINGE
INTRAMUSCULAR | Status: AC
  Filled 2016-08-05: qty 5

## 2016-08-05 MED ORDER — OXYCODONE-ACETAMINOPHEN 5-325 MG PO TABS
1.0000 | ORAL_TABLET | ORAL | Status: DC | PRN
  Administered 2016-08-05 (×2): 1 via ORAL
  Administered 2016-08-06 (×3): 2 via ORAL
  Filled 2016-08-05 (×3): qty 1
  Filled 2016-08-05 (×2): qty 2
  Filled 2016-08-05: qty 1

## 2016-08-05 MED ORDER — SUGAMMADEX SODIUM 200 MG/2ML IV SOLN
INTRAVENOUS | Status: AC
  Filled 2016-08-05: qty 2

## 2016-08-05 MED ORDER — ROCURONIUM BROMIDE 100 MG/10ML IV SOLN
INTRAVENOUS | Status: AC
  Filled 2016-08-05: qty 1

## 2016-08-05 MED ORDER — ACETAMINOPHEN 10 MG/ML IV SOLN
1000.0000 mg | Freq: Once | INTRAVENOUS | Status: AC
  Administered 2016-08-05: 1000 mg via INTRAVENOUS

## 2016-08-05 MED ORDER — KETOROLAC TROMETHAMINE 15 MG/ML IJ SOLN
15.0000 mg | Freq: Four times a day (QID) | INTRAMUSCULAR | Status: AC
  Administered 2016-08-05 – 2016-08-06 (×4): 15 mg via INTRAVENOUS
  Filled 2016-08-05 (×4): qty 1

## 2016-08-05 MED ORDER — MEPERIDINE HCL 50 MG/ML IJ SOLN
INTRAMUSCULAR | Status: AC
  Filled 2016-08-05: qty 1

## 2016-08-05 MED ORDER — METOCLOPRAMIDE HCL 5 MG/ML IJ SOLN
10.0000 mg | Freq: Once | INTRAMUSCULAR | Status: DC | PRN
Start: 2016-08-05 — End: 2016-08-05

## 2016-08-05 MED ORDER — ONDANSETRON HCL 4 MG/2ML IJ SOLN: 4.0000 mg | Freq: Four times a day (QID) | INTRAMUSCULAR | Status: DC | PRN

## 2016-08-05 MED ORDER — IBUPROFEN 800 MG PO TABS: 800.0000 mg | ORAL_TABLET | Freq: Three times a day (TID) | ORAL | Status: DC | PRN

## 2016-08-05 MED ORDER — ONDANSETRON HCL 4 MG/2ML IJ SOLN
INTRAMUSCULAR | Status: DC | PRN
  Administered 2016-08-05: 4 mg via INTRAVENOUS

## 2016-08-05 MED ORDER — ACETAMINOPHEN 10 MG/ML IV SOLN
INTRAVENOUS | Status: AC
  Filled 2016-08-05: qty 100

## 2016-08-05 MED ORDER — ONDANSETRON HCL 4 MG/2ML IJ SOLN
INTRAMUSCULAR | Status: AC
  Filled 2016-08-05: qty 2

## 2016-08-05 MED ORDER — STERILE WATER FOR INJECTION IJ SOLN
INTRAMUSCULAR | Status: DC | PRN
  Administered 2016-08-05: 2 mL

## 2016-08-05 MED ORDER — LACTATED RINGERS IV SOLN: INTRAVENOUS | Status: DC

## 2016-08-05 MED ORDER — MIDAZOLAM HCL 2 MG/2ML IJ SOLN
INTRAMUSCULAR | Status: AC
  Filled 2016-08-05: qty 2

## 2016-08-05 MED ORDER — PROPOFOL 10 MG/ML IV BOLUS
INTRAVENOUS | Status: DC | PRN
  Administered 2016-08-05: 110 mg via INTRAVENOUS

## 2016-08-05 MED ORDER — KCL IN DEXTROSE-NACL 20-5-0.45 MEQ/L-%-% IV SOLN
INTRAVENOUS | Status: DC
  Administered 2016-08-05: 12:00:00 via INTRAVENOUS
  Filled 2016-08-05 (×3): qty 1000

## 2016-08-05 MED ORDER — HYDROMORPHONE HCL 1 MG/ML IJ SOLN
INTRAMUSCULAR | Status: AC
  Filled 2016-08-05: qty 1

## 2016-08-05 MED ORDER — MIDAZOLAM HCL 2 MG/2ML IJ SOLN
INTRAMUSCULAR | Status: DC | PRN
  Administered 2016-08-05: 2 mg via INTRAVENOUS

## 2016-08-05 MED ORDER — LIDOCAINE 2% (20 MG/ML) 5 ML SYRINGE
INTRAMUSCULAR | Status: DC | PRN
  Administered 2016-08-05: 60 mg via INTRAVENOUS

## 2016-08-05 MED ORDER — CEFAZOLIN SODIUM-DEXTROSE 2-4 GM/100ML-% IV SOLN
2.0000 g | INTRAVENOUS | Status: AC
  Administered 2016-08-05: 2 g via INTRAVENOUS
  Filled 2016-08-05: qty 100

## 2016-08-05 MED ORDER — ENOXAPARIN SODIUM 40 MG/0.4ML ~~LOC~~ SOLN
40.0000 mg | SUBCUTANEOUS | Status: AC
  Administered 2016-08-05: 40 mg via SUBCUTANEOUS
  Filled 2016-08-05: qty 0.4

## 2016-08-05 MED ORDER — ONDANSETRON HCL 4 MG PO TABS: 4.0000 mg | ORAL_TABLET | Freq: Four times a day (QID) | ORAL | Status: DC | PRN

## 2016-08-05 MED ORDER — HYDROMORPHONE HCL 1 MG/ML IJ SOLN
0.2000 mg | INTRAMUSCULAR | Status: AC | PRN
  Administered 2016-08-05 (×2): 0.5 mg via INTRAVENOUS
  Filled 2016-08-05 (×2): qty 1

## 2016-08-05 MED ORDER — LACTATED RINGERS IV SOLN
INTRAVENOUS | Status: DC | PRN
Start: 2016-08-05 — End: 2016-08-05
  Administered 2016-08-05 (×2): via INTRAVENOUS

## 2016-08-05 MED ORDER — MEPERIDINE HCL 50 MG/ML IJ SOLN
6.2500 mg | INTRAMUSCULAR | Status: DC | PRN
  Administered 2016-08-05: 12.5 mg via INTRAVENOUS

## 2016-08-05 MED ORDER — SUFENTANIL CITRATE 50 MCG/ML IV SOLN
INTRAVENOUS | Status: AC
  Filled 2016-08-05: qty 1

## 2016-08-05 MED ORDER — DEXAMETHASONE SODIUM PHOSPHATE 10 MG/ML IJ SOLN
INTRAMUSCULAR | Status: DC | PRN
  Administered 2016-08-05: 10 mg via INTRAVENOUS

## 2016-08-05 MED ORDER — SUGAMMADEX SODIUM 200 MG/2ML IV SOLN
INTRAVENOUS | Status: DC | PRN
  Administered 2016-08-05: 200 mg via INTRAVENOUS

## 2016-08-05 MED ORDER — SUFENTANIL CITRATE 50 MCG/ML IV SOLN
INTRAVENOUS | Status: DC | PRN
  Administered 2016-08-05: 5 ug via INTRAVENOUS
  Administered 2016-08-05: 10 ug via INTRAVENOUS
  Administered 2016-08-05 (×2): 5 ug via INTRAVENOUS
  Administered 2016-08-05: 10 ug via INTRAVENOUS
  Administered 2016-08-05: 5 ug via INTRAVENOUS

## 2016-08-05 MED ORDER — LACTATED RINGERS IV SOLN
INTRAVENOUS | Status: DC | PRN
  Administered 2016-08-05: 1000 mL

## 2016-08-05 MED ORDER — HYDROMORPHONE HCL 1 MG/ML IJ SOLN
0.2500 mg | INTRAMUSCULAR | Status: DC | PRN
  Administered 2016-08-05 (×2): 0.5 mg via INTRAVENOUS

## 2016-08-05 MED ORDER — INFLUENZA VAC SPLIT QUAD 0.5 ML IM SUSY
0.5000 mL | PREFILLED_SYRINGE | INTRAMUSCULAR | Status: AC
  Administered 2016-08-06: 0.5 mL via INTRAMUSCULAR
  Filled 2016-08-05 (×2): qty 0.5

## 2016-08-05 MED ORDER — FENTANYL CITRATE (PF) 100 MCG/2ML IJ SOLN: 25.0000 ug | INTRAMUSCULAR | Status: DC | PRN

## 2016-08-05 MED ORDER — STERILE WATER FOR INJECTION IJ SOLN
INTRAMUSCULAR | Status: AC
  Filled 2016-08-05: qty 20

## 2016-08-05 SURGICAL SUPPLY — 58 items
APL ESCP 34 STRL LF DISP (HEMOSTASIS)
APPLICATOR SURGIFLO ENDO (HEMOSTASIS) IMPLANT
BAG LAPAROSCOPIC 12 15 PORT 16 (BASKET) IMPLANT
BAG RETRIEVAL 12/15 (BASKET)
BAG SPEC RTRVL LRG 6X4 10 (ENDOMECHANICALS)
CHLORAPREP W/TINT 26ML (MISCELLANEOUS) ×2 IMPLANT
COVER SURGICAL LIGHT HANDLE (MISCELLANEOUS) ×2 IMPLANT
COVER TIP SHEARS 8 DVNC (MISCELLANEOUS) ×1 IMPLANT
COVER TIP SHEARS 8MM DA VINCI (MISCELLANEOUS) ×1
DRAPE ARM DVNC X/XI (DISPOSABLE) ×4 IMPLANT
DRAPE COLUMN DVNC XI (DISPOSABLE) ×1 IMPLANT
DRAPE DA VINCI XI ARM (DISPOSABLE) ×4
DRAPE DA VINCI XI COLUMN (DISPOSABLE) ×1
DRAPE SHEET LG 3/4 BI-LAMINATE (DRAPES) ×4 IMPLANT
DRAPE SURG IRRIG POUCH 19X23 (DRAPES) ×2 IMPLANT
ELECT REM PT RETURN 15FT ADLT (MISCELLANEOUS) ×2 IMPLANT
GLOVE BIO SURGEON STRL SZ 6 (GLOVE) ×8 IMPLANT
GLOVE BIO SURGEON STRL SZ 6.5 (GLOVE) ×4 IMPLANT
GOWN STRL REUS W/ TWL LRG LVL3 (GOWN DISPOSABLE) ×2 IMPLANT
GOWN STRL REUS W/TWL LRG LVL3 (GOWN DISPOSABLE) ×4
HOLDER FOLEY CATH W/STRAP (MISCELLANEOUS) ×2 IMPLANT
IRRIG SUCT STRYKERFLOW 2 WTIP (MISCELLANEOUS) ×2
IRRIGATION SUCT STRKRFLW 2 WTP (MISCELLANEOUS) ×1 IMPLANT
KIT BASIN OR (CUSTOM PROCEDURE TRAY) ×2 IMPLANT
KIT PROCEDURE DA VINCI SI (MISCELLANEOUS)
KIT PROCEDURE DVNC SI (MISCELLANEOUS) IMPLANT
LIQUID BAND (GAUZE/BANDAGES/DRESSINGS) ×2 IMPLANT
MANIPULATOR UTERINE 4.5 ZUMI (MISCELLANEOUS) ×2 IMPLANT
MARKER SKIN DUAL TIP RULER LAB (MISCELLANEOUS) ×2 IMPLANT
NDL SAFETY ECLIPSE 18X1.5 (NEEDLE) ×1 IMPLANT
NDL SPNL 18GX3.5 QUINCKE PK (NEEDLE) ×1 IMPLANT
NEEDLE HYPO 18GX1.5 SHARP (NEEDLE) ×2
NEEDLE SPNL 18GX3.5 QUINCKE PK (NEEDLE) ×2 IMPLANT
OBTURATOR XI 8MM BLADELESS (TROCAR) ×2 IMPLANT
OCCLUDER COLPOPNEUMO (BALLOONS) ×2 IMPLANT
PAD POSITIONING PINK XL (MISCELLANEOUS) ×2 IMPLANT
PORT ACCESS TROCAR AIRSEAL 12 (TROCAR) ×1 IMPLANT
PORT ACCESS TROCAR AIRSEAL 5M (TROCAR) ×1
POUCH SPECIMEN RETRIEVAL 10MM (ENDOMECHANICALS) IMPLANT
SEAL CANN UNIV 5-8 DVNC XI (MISCELLANEOUS) ×4 IMPLANT
SEAL XI 5MM-8MM UNIVERSAL (MISCELLANEOUS) ×4
SET TRI-LUMEN FLTR TB AIRSEAL (TUBING) ×2 IMPLANT
SHEET LAVH (DRAPES) ×2 IMPLANT
SOLUTION ELECTROLUBE (MISCELLANEOUS) ×2 IMPLANT
SURGIFLO W/THROMBIN 8M KIT (HEMOSTASIS) IMPLANT
SUT MNCRL AB 4-0 PS2 18 (SUTURE) ×4 IMPLANT
SUT VIC AB 0 CT1 27 (SUTURE) ×2
SUT VIC AB 0 CT1 27XBRD ANTBC (SUTURE) ×1 IMPLANT
SYR 50ML LL SCALE MARK (SYRINGE) ×2 IMPLANT
SYRINGE 10CC LL (SYRINGE) ×2 IMPLANT
TOWEL OR 17X26 10 PK STRL BLUE (TOWEL DISPOSABLE) ×4 IMPLANT
TOWEL OR NON WOVEN STRL DISP B (DISPOSABLE) ×2 IMPLANT
TRAP SPECIMEN MUCOUS 40CC (MISCELLANEOUS) IMPLANT
TRAY LAPAROSCOPIC (CUSTOM PROCEDURE TRAY) ×2 IMPLANT
TROCAR BLADELESS OPT 5 100 (ENDOMECHANICALS) ×2 IMPLANT
UNDERPAD 30X30 (UNDERPADS AND DIAPERS) ×2 IMPLANT
UNDERPAD 30X30 INCONTINENT (UNDERPADS AND DIAPERS) ×2 IMPLANT
WATER STERILE IRR 1500ML POUR (IV SOLUTION) ×3 IMPLANT

## 2016-08-05 NOTE — Anesthesia Preprocedure Evaluation (Signed)
Anesthesia Evaluation  Patient identified by MRN, date of birth, ID band Patient awake    Reviewed: Allergy & Precautions, NPO status , Patient's Chart, lab work & pertinent test results  History of Anesthesia Complications (+) PROLONGED EMERGENCE  Airway Mallampati: II  TM Distance: >3 FB Neck ROM: Full    Dental no notable dental hx.    Pulmonary neg pulmonary ROS,    Pulmonary exam normal breath sounds clear to auscultation       Cardiovascular negative cardio ROS Normal cardiovascular exam Rhythm:Regular Rate:Normal     Neuro/Psych negative neurological ROS  negative psych ROS   GI/Hepatic negative GI ROS, Neg liver ROS,   Endo/Other  negative endocrine ROS  Renal/GU negative Renal ROS  negative genitourinary   Musculoskeletal negative musculoskeletal ROS (+)   Abdominal   Peds negative pediatric ROS (+)  Hematology negative hematology ROS (+)   Anesthesia Other Findings   Reproductive/Obstetrics negative OB ROS                            Anesthesia Physical Anesthesia Plan  ASA: II  Anesthesia Plan: General   Post-op Pain Management:    Induction: Intravenous  Airway Management Planned: Oral ETT  Additional Equipment:   Intra-op Plan:   Post-operative Plan: Extubation in OR  Informed Consent: I have reviewed the patients History and Physical, chart, labs and discussed the procedure including the risks, benefits and alternatives for the proposed anesthesia with the patient or authorized representative who has indicated his/her understanding and acceptance.   Dental advisory given  Plan Discussed with: CRNA  Anesthesia Plan Comments:         Anesthesia Quick Evaluation

## 2016-08-05 NOTE — H&P (View-Only) (Signed)
Consult Note: Gyn-Onc  Consult was requested by Dr. Matthew Saras for the evaluation of [name] 65 y.o. female  CC:  Chief Complaint  Patient presents with  . endometrial cancer    new consultation    Assessment/Plan:  Ms. [name]  is a 65 y.o.  year old with grade 2 endometrial cancer and a strong family history of cancer. She also has a history of chronic pain and opiate use secondary to a spine fracture.   A detailed discussion was held with the patient and her family with regard to to her endometrial cancer diagnosis. We discussed the standard management options for uterine cancer which includes surgery followed possibly by adjuvant therapy depending on the results of surgery. The options for surgical management include a hysterectomy and removal of the tubes and ovaries possibly with removal of pelvic and para-aortic lymph nodes. A minimally invasive approach including a robotic hysterectomy or laparoscopic hysterectomy have benefits including shorter hospital stay, recovery time and better wound healing. The alternative approach is an open hysterectomy. The patient has been counseled about these surgical options and the risks of surgery in general including infection, bleeding, damage to surrounding structures (including bowel, bladder, ureters, nerves or vessels), and the postoperative risks of PE/ DVT, and lymphedema. I extensively reviewed the additional risks of robotic hysterectomy including possible need for conversion to open laparotomy.  I discussed positioning during surgery of trendelenberg and risks of minor facial swelling and care we take in preoperative positioning.  After counseling and consideration of her options, she desires to proceed with robotic hysterectomy/BSO/SLN biopsy.   She requires evaluation by genetic counselors due to her strong family history of malignancy and concern for lynch syndrome.  She has chronic back pain, and left leg weakness and  paresthesia secondary to her spinal injury. I discussed she has increased risk for opiate use and poor pain control. I will plan on perioperative gabapentin and aggressive non-opiate strategies.  We will review her op note to determine if peritoneal entry was required for her spinal repair. I discussed with the patient that if so, this increases her risk for intraoperative visceral injury.  She will be seen by anesthesia for preoperative clearance and discussion of postoperative pain management.  She was given the opportunity to ask questions, which were answered to her satisfaction, and she is agreement with the above mentioned plan of care.  HPI: [name] is a 65 year old G3P3 who is seen in consultation at the request of Dr Matthew Saras for grade 2 endometrial cancer.   The patient reports a history of postmenopausal bleeding since June 2017. She underwent a sonohysterogram in Dr. Delanna Ahmadi office on 06/01/2016 that demonstrated a normal sized uterus measuring 6.5 x 2.6 x 2.6 cm but with an endometrial polyp measuring 2.4 cm. She was taken to the operating room on 07/06/2016 for a minor sure resection of the polyp. Final pathology from this revealed a grade 2 endometrioid adenocarcinoma.  The patient is otherwise a fairly healthy woman. Her major medical history significant for a horseback riding accident from which she required repair of L1-2 and 3 through a lateral approach on the left. She has a flank incision from this. She has persistent left lower extremity weakness, and paresthesia secondary to this. She also has chronic opiate use and takes Percocet 7.5 mg 3 times a day and Neurontin 3 times a day due to this pain.  She is a striking family history for malignancy. Her son died  at age 17 of sarcoma. Her sister has a history of leukemia, and her mother also died of leukemia. She is maternal grandmother with a history of colon cancer and a maternal great aunt with history of colon cancer.    Current Meds:  Outpatient Encounter Prescriptions as of 07/28/2016  Medication Sig  . gabapentin (NEURONTIN) 300 MG capsule Take 300 mg by mouth 4 (four) times daily.  Marland Kitchen oxyCODONE-acetaminophen (PERCOCET) 7.5-325 MG tablet Take 1-2 tablets by mouth every 8 (eight) hours as needed for severe pain.   No facility-administered encounter medications on file as of 07/28/2016.     Allergy: No Known Allergies  Social Hx:   Social History   Social History  . Marital status: Divorced    Spouse name: N/A  . Number of children: N/A  . Years of education: N/A   Occupational History  . Not on file.   Social History Main Topics  . Smoking status: Never Smoker  . Smokeless tobacco: Never Used  . Alcohol use No  . Drug use: No  . Sexual activity: Not on file   Other Topics Concern  . Not on file   Social History Narrative  . No narrative on file    Past Surgical Hx:  Past Surgical History:  Procedure Laterality Date  . HYSTEROSCOPY W/D&C N/A 07/06/2016   Procedure: DILATATION AND CURETTAGE /HYSTEROSCOPY;  Surgeon: Molli Posey, MD;  Location: [facility];  Service: Gynecology;  Laterality: N/A;  . INCISION AND DRAINAGE BREAST ABSCESS Left 1977  . LUMBAR FUSION  2007   L1 -- L3    Past Medical Hx:  Past Medical History:  Diagnosis Date  . Arthritis    HANDS  . Borderline hypertension   . Chronic low back pain   . Degenerative spinal arthritis   . Endometrial polyp   . Numbness in left leg    DUE TO BACK INJURY 2007  . Osteoporosis   . PMB (postmenopausal bleeding)   . Wears glasses     Past Gynecological History:  G3P3 SVD'x No LMP recorded. Patient is postmenopausal.  Family Hx:  Family History  Problem Relation Age of Onset  . Cancer Mother     leukemia    Review of Systems:  Constitutional  Feels well,    ENT Normal appearing ears and nares bilaterally Skin/Breast  No rash, sores, jaundice, itching, dryness Cardiovascular  No chest  pain, shortness of breath, or edema  Pulmonary  No cough or wheeze.  Gastro Intestinal  No nausea, vomitting, or diarrhoea. No bright red blood per rectum, no abdominal pain, change in bowel movement, or constipation.  Genito Urinary  No frequency, urgency, dysuria, + postmenopausal bleeding Musculo Skeletal  No myalgia, arthralgia, joint swelling or pain  Neurologic  No weakness, numbness, change in gait,  Psychology  No depression, anxiety, insomnia.   Vitals:  Blood pressure (!) 141/96, pulse 84, temperature 98.3 F (36.8 C), temperature source Oral, resp. rate 18, height 5\' 2"  (1.575 m), weight 147 lb 4.8 oz (66.8 kg), SpO2 98 %.  Physical Exam: WD in NAD Neck  Supple NROM, without any enlargements.  Lymph Node Survey No cervical supraclavicular or inguinal adenopathy Cardiovascular  Pulse normal rate, regularity and rhythm. S1 and S2 normal.  Lungs  Clear to auscultation bilateraly, without wheezes/crackles/rhonchi. Good air movement.  Skin  No rash/lesions/breakdown  Psychiatry  Alert and oriented to person, place, and time  Abdomen  Normoactive bowel sounds, abdomen soft, non-tender and nonobese without  evidence of hernia.  Back No CVA tenderness Genito Urinary  Vulva/vagina: Normal external female genitalia.  No lesions. No discharge or bleeding.  Bladder/urethra:  No lesions or masses, well supported bladder  Vagina: normal  Cervix: Normal appearing, no lesions.  Uterus:  Small, mobile, no parametrial involvement or nodularity.  Adnexa: no palpable masses. Rectal  deferred Extremities  No bilateral cyanosis, clubbing or edema.   [name], MD  07/28/2016, 10:20 AM

## 2016-08-05 NOTE — Transfer of Care (Signed)
Immediate Anesthesia Transfer of Care Note  Patient: [name]  Procedure(s) Performed: Procedure(s): XI ROBOTIC ASSISTED TOTAL HYSTERECTOMY WITH BILATERAL SALPINGO OOPHORECTOMY SENTINAL LYMPH NODE BIOPSY (Bilateral)  Patient Location: PACU  Anesthesia Type:General  Level of Consciousness: awake, alert  and oriented  Airway & Oxygen Therapy: Patient Spontanous Breathing and Patient connected to face mask oxygen  Post-op Assessment: Report given to RN and Post -op Vital signs reviewed and stable  Post vital signs: Reviewed and stable  Last Vitals:  Vitals:   08/05/16 0545  BP: (!) 142/85  Pulse: 94  Resp: 16  Temp: 36.8 C    Last Pain:  Vitals:   08/05/16 0545  TempSrc: Oral  PainSc:       Patients Stated Pain Goal: 5 ([id number])  Complications: No apparent anesthesia complications

## 2016-08-05 NOTE — Op Note (Signed)
OPERATIVE NOTE 08/05/16  Surgeon: [name]   Assistants: Dr Lahoma Crocker (an MD assistant was necessary for tissue manipulation, management of robotic instrumentation, retraction and positioning due to the complexity of the case and hospital policies).   Anesthesia: General endotracheal anesthesia  ASA Class: 2   Pre-operative Diagnosis: grade 2 endometrial cancer  Post-operative Diagnosis: same  Operation: Robotic-assisted laparoscopic total hysterectomy with bilateral salpingoophorectomy, sentinel lymph node biopsy   Surgeon: [name]  Assistant Surgeon: Lahoma Crocker MD  Anesthesia: GET  Urine Output:  200  Operative Findings:  : 6cm normal appearing uterus, tubes and ovaries. No suspicious nodes.  Estimated Blood Loss:  <20cc      Total IV Fluids: 600 ml         Specimens: uterus, cervix, bilateral tubes and ovaries. Left and right external iliac SLN.         Complications:  None; patient tolerated the procedure well.         Disposition: PACU - hemodynamically stable.  Procedure Details  The patient was seen in the Holding Room. The risks, benefits, complications, treatment options, and expected outcomes were discussed with the patient.  The patient concurred with the proposed plan, giving informed consent.  The site of surgery properly noted/marked. The patient was identified as [name] and the procedure verified as a Robotic-assisted hysterectomy with bilateral salpingo oophorectomy. A Time Out was held and the above information confirmed.  After induction of anesthesia, the patient was draped and prepped in the usual sterile manner. Pt was placed in supine position after anesthesia and draped and prepped in the usual sterile manner. The abdominal drape was placed after the CholoraPrep had been allowed to dry for 3 minutes.  Her arms were tucked to her side with all appropriate precautions.  The shoulders were stabilized with  padded shoulder blocks applied to the acromium processes.  The patient was placed in the semi-lithotomy position in [place].  The perineum was prepped with Betadine. The patient was then prepped. Foley catheter was placed.  A sterile speculum was placed in the vagina.  The cervix was grasped with a single-tooth tenaculum and dilated with Kennon Rounds dilators. 1mg  total of ICG was injected into the cervical stroma at 2 and 9 o'clock at a 24mm depth (concentration 0..5mg /ml).  The ZUMI uterine manipulator with a medium colpotomizer ring was placed without difficulty.  A pneum occluder balloon was placed over the manipulator.  OG tube placement was confirmed and to suction.   Next, a 5 mm skin incision was made 1 cm below the subcostal margin in the midclavicular line.  The 5 mm Optiview port and scope was used for direct entry.  Opening pressure was under 10 mm CO2.  The abdomen was insufflated and the findings were noted as above.   At this point and all points during the procedure, the patient's intra-abdominal pressure did not exceed 15 mmHg. Next, a 10 mm skin incision was made in the umbilicus and a right and left port was placed about 10 cm lateral to the robot port on the right and left side.  A fourth arm was placed in the left lower quadrant 2 cm above and superior and medial to the anterior superior iliac spine.  All ports were placed under direct visualization.  The patient was placed in steep Trendelenburg.  Bowel was folded away into the upper abdomen.  The robot was docked in the normal manner.  The right and left peritoneum  were opened parallel to the IP ligament to open the retroperitoneal spaces bilaterally. The SLN mapping was performed in bilateral pelvic basins. The para rectal and paravesical spaces were opened up. Lymphatic channels were identified travelling to the following visualized sentinel lymph node's: left and right external iliac SLN's. These SLN's were separated from their  surrounding lymphatic tissue, removed and sent for permanent pathology.  The hysterectomy was started after the round ligament on the right side was incised and the retroperitoneum was entered and the pararectal space was developed.  The ureter was noted to be on the medial leaf of the broad ligament.  The peritoneum above the ureter was incised and stretched and the infundibulopelvic ligament was skeletonized, cauterized and cut.  The posterior peritoneum was taken down to the level of the KOH ring.  The anterior peritoneum was also taken down.  The bladder flap was created to the level of the KOH ring.  The uterine artery on the right side was skeletonized, cauterized and cut in the normal manner.  A similar procedure was performed on the left.  The colpotomy was made and the uterus, cervix, bilateral ovaries and tubes were amputated and delivered through the vagina.  Pedicles were inspected and excellent hemostasis was achieved.    The colpotomy at the vaginal cuff was closed with Vicryl on a CT1 needle in a running manner.  Irrigation was used and excellent hemostasis was achieved.  At this point in the procedure was completed.  Robotic instruments were removed under direct visulaization.  The robot was undocked. The 10 mm ports were closed with Vicryl on a UR-5 needle and the fascia was closed with 0 Vicryl on a UR-5 needle.  The skin was closed with 4-0 Vicryl in a subcuticular manner.  Dermabond was applied.  Sponge, lap and needle counts correct x 2.  The patient was taken to the recovery room in stable condition.  The vagina was swabbed with  minimal bleeding noted.   All instrument and needle counts were correct x  3.   The patient was transferred to the recovery room in a stable condition.  [name], MD

## 2016-08-05 NOTE — Anesthesia Postprocedure Evaluation (Signed)
Anesthesia Post Note  Patient: [name]  Procedure(s) Performed: Procedure(s) (LRB): XI ROBOTIC ASSISTED TOTAL HYSTERECTOMY WITH BILATERAL SALPINGO OOPHORECTOMY SENTINAL LYMPH NODE BIOPSY (Bilateral)  Patient location during evaluation: PACU Anesthesia Type: General Level of consciousness: awake and alert Pain management: pain level controlled Vital Signs Assessment: post-procedure vital signs reviewed and stable Respiratory status: spontaneous breathing, nonlabored ventilation, respiratory function stable and patient connected to nasal cannula oxygen Cardiovascular status: stable and blood pressure returned to baseline Postop Assessment: no signs of nausea or vomiting Anesthetic complications: no    Last Vitals:  Vitals:   08/05/16 1030 08/05/16 1110  BP: 120/78 119/75  Pulse: 81 88  Resp: 11 15  Temp: 36.7 C 36.6 C    Last Pain:  Vitals:   08/05/16 1120  TempSrc:   PainSc: 3                  Montez Hageman

## 2016-08-05 NOTE — Anesthesia Procedure Notes (Addendum)
Procedure Name: Intubation Date/Time: 08/05/2016 7:32 AM Performed by: Sharlette Dense Pre-anesthesia Checklist: Patient identified, Emergency Drugs available, Suction available, Patient being monitored and Timeout performed Patient Re-evaluated:Patient Re-evaluated prior to inductionOxygen Delivery Method: Circle system utilized Preoxygenation: Pre-oxygenation with 100% oxygen Intubation Type: IV induction Ventilation: Mask ventilation without difficulty Laryngoscope Size: Miller and 2 Grade View: Grade I Tube type: Oral Tube size: 7.5 mm Number of attempts: 1 Airway Equipment and Method: Stylet Placement Confirmation: ETT inserted through vocal cords under direct vision,  positive ETCO2 and breath sounds checked- equal and bilateral Secured at: 20 cm Tube secured with: Tape Dental Injury: Teeth and Oropharynx as per pre-operative assessment

## 2016-08-05 NOTE — Interval H&P Note (Signed)
History and Physical Interval Note:  08/05/2016 6:58 AM  [name]  has presented today for surgery, with the diagnosis of Endomentrial Cancer  The various methods of treatment have been discussed with the patient and family. After consideration of risks, benefits and other options for treatment, the patient has consented to  Procedure(s): XI ROBOTIC ASSISTED TOTAL HYSTERECTOMY WITH BILATERAL SALPINGO OOPHORECTOMY SENTINAL LYMPH NODE BIOPSY (Bilateral) as a surgical intervention .  The patient's history has been reviewed, patient examined, no change in status, stable for surgery.  I have reviewed the patient's chart and labs.  Questions were answered to the patient's satisfaction.     [name]

## 2016-08-06 DIAGNOSIS — C541 Malignant neoplasm of endometrium: Secondary | ICD-10-CM | POA: Diagnosis not present

## 2016-08-06 LAB — BASIC METABOLIC PANEL
Anion gap: 8 (ref 5–15)
BUN: 7 mg/dL (ref 6–20)
CALCIUM: 9 mg/dL (ref 8.9–10.3)
CO2: 25 mmol/L (ref 22–32)
CREATININE: 0.72 mg/dL (ref 0.44–1.00)
Chloride: 103 mmol/L (ref 101–111)
GFR calc Af Amer: >60 mL/min (ref >60)
Glucose, Bld: 133 mg/dL — ABNORMAL HIGH (ref 65–99)
Potassium: 4.5 mmol/L (ref 3.5–5.1)
SODIUM: 136 mmol/L (ref 135–145)

## 2016-08-06 LAB — CBC
HCT: 33.8 % — ABNORMAL LOW (ref 36.0–46.0)
Hemoglobin: 11.2 g/dL — ABNORMAL LOW (ref 12.0–15.0)
MCH: 30.9 pg (ref 26.0–34.0)
MCHC: 33.1 g/dL (ref 30.0–36.0)
MCV: 93.1 fL (ref 78.0–100.0)
PLATELETS: 247 10*3/uL (ref 150–400)
RBC: 3.63 MIL/uL — ABNORMAL LOW (ref 3.87–5.11)
RDW: 12 % (ref 11.5–15.5)
WBC: 12.3 10*3/uL — ABNORMAL HIGH (ref 4.0–10.5)

## 2016-08-06 NOTE — Discharge Summary (Signed)
Physician Discharge Summary  Patient ID: [name] MRN: [id number] DOB/AGE: 11-25-1950 65 y.o.  Admit date: 08/05/2016 Discharge date: 08/06/2016  Admission Diagnoses: Endometrial cancer [facility])  Discharge Diagnoses:  Principal Problem:   Endometrial cancer [facility])   Discharged Condition:  The patient is in good condition and stable for discharge.    Hospital Course: On 08/05/2016, the patient underwent the following: Procedure(s): XI ROBOTIC ASSISTED TOTAL HYSTERECTOMY WITH BILATERAL SALPINGO OOPHORECTOMY SENTINAL LYMPH NODE BIOPSY.   The postoperative course was uneventful.  She was discharged to home on postoperative day 1 tolerating a regular diet, minimal pain, ambulating, voiding.  Consults: None  Significant Diagnostic Studies: None  Treatments: surgery: see above  Discharge Exam: Blood pressure 116/69, pulse 79, temperature 98.8 F (37.1 C), temperature source Oral, resp. rate 18, height 5\' 2"  (1.575 m), weight 143 lb (64.9 kg), SpO2 95 %. General appearance: alert, cooperative and no distress Resp: clear to auscultation bilaterally Cardio: regular rate and rhythm, S1, S2 normal, no murmur, click, rub or gallop GI: soft, non-tender; bowel sounds normal; no masses,  no organomegaly Extremities: extremities normal, atraumatic, no cyanosis or edema Incision/Wound: Lap sites to the abdomen with dermabond without erythema or drainage  Disposition: 01-Home or Self Care  Discharge Instructions    Call MD for:  difficulty breathing, headache or visual disturbances    Complete by:  As directed    Call MD for:  extreme fatigue    Complete by:  As directed    Call MD for:  hives    Complete by:  As directed    Call MD for:  persistant dizziness or light-headedness    Complete by:  As directed    Call MD for:  persistant nausea and vomiting    Complete by:  As directed    Call MD for:  redness, tenderness, or signs of infection (pain, swelling, redness, odor or  green/yellow discharge around incision site)    Complete by:  As directed    Call MD for:  severe uncontrolled pain    Complete by:  As directed    Call MD for:  temperature >100.4    Complete by:  As directed    Diet - low sodium heart healthy    Complete by:  As directed    Driving Restrictions    Complete by:  As directed    No driving for 1 week.  Do not take narcotics and drive.   Increase activity slowly    Complete by:  As directed    Lifting restrictions    Complete by:  As directed    No lifting greater than 10 lbs.   Sexual Activity Restrictions    Complete by:  As directed    No sexual activity, nothing in the vagina, for 8 weeks.       Medication List    TAKE these medications   acetaminophen 500 MG tablet Commonly known as:  TYLENOL Take 1,000 mg by mouth 2 (two) times daily as needed for moderate pain.   gabapentin 300 MG capsule Commonly known as:  NEURONTIN Take 300 mg by mouth 4 (four) times daily.   oxyCODONE-acetaminophen 7.5-325 MG tablet Commonly known as:  PERCOCET Take 2 tablets by mouth every 8 (eight) hours as needed for severe pain.      Follow-up Information    [name], MD Follow up on 08/23/2016.   Specialty:  Obstetrics and Gynecology Why:  at 3:30 pm at the [facility]  information: [address] [place] [address] [phone]           Greater than thirty minutes were spend for face to face discharge instructions and discharge orders/summary in EPIC.   Signed: CROSS, MELISSA DEAL 08/06/2016, 10:38 AM

## 2016-08-06 NOTE — Care Management Obs Status (Signed)
[place] NOTIFICATION   Patient Details  Name: [name] MRN: [id number] Date of Birth: [birth year]   Medicare Observation Status Notification Given:  Yes    MahabirJuliann Pulse, RN 08/06/2016, 11:04 AM

## 2016-08-06 NOTE — Discharge Instructions (Signed)
08/06/2016  Return to work: 4-6 weeks if applicable  Activity: 1. Be up and out of the bed during the day.  Take a nap if needed.  You may walk up steps but be careful and use the hand rail.  Stair climbing will tire you more than you think, you may need to stop part way and rest.   2. No lifting or straining for 6 weeks.  3. No driving for 1 week(s).  Do not drive if you are taking narcotic pain medicine.  4. Shower daily.  Use soap and water on your incision and pat dry; don't rub.  No tub baths until cleared by your surgeon.   5. No sexual activity and nothing in the vagina for 8 weeks.  6. You may experience a small amount of clear drainage from your incisions, which is normal.  If the drainage persists or increases, please call the office.   Diet: 1. Low sodium Heart Healthy Diet is recommended.  2. It is safe to use a laxative, such as Miralax or Colace, if you have difficulty moving your bowels.   Wound Care: 1. Keep clean and dry.  Shower daily.  Reasons to call the Doctor:  Fever - Oral temperature greater than 100.4 degrees Fahrenheit  Foul-smelling vaginal discharge  Difficulty urinating  Nausea and vomiting  Increased pain at the site of the incision that is unrelieved with pain medicine.  Difficulty breathing with or without chest pain  New calf pain especially if only on one side  Sudden, continuing increased vaginal bleeding with or without clots.   Contacts: For questions or concerns you should contact:  Dr. Everitt Amber at [phone]  Joylene John, NP at [phone]  After Hours: call [phone] and have the GYN Oncologist paged/contacted   Abdominal Hysterectomy, Care After These instructions give you information on caring for yourself after your procedure. Your doctor may also give you more specific instructions. Call your doctor if you have any problems or questions after your procedure.  HOME CARE It takes 4-6 weeks to recover from this  surgery. Follow all of your doctor's instructions.   Only take medicines as told by your doctor.  Take showers for 2-3 weeks. Ask your doctor when it is okay to shower.  Do not douche, use tampons, or have sex (intercourse) for at least 6 weeks or as told.  Follow your doctor's advice about exercise, lifting objects, driving, and general activities.  Get plenty of rest and sleep.  Do not lift anything heavier than a gallon of milk (about 10 pounds [4.5 kilograms]) for the first month after surgery.  Get back to your normal diet as told by your doctor.  Do not drink alcohol until your doctor says it is okay.  Take a medicine to help you poop (laxative) as told by your doctor.  Eating foods high in fiber may help you poop. Eat a lot of raw fruits and vegetables, whole grains, and beans.  Drink enough fluids to keep your pee (urine) clear or pale yellow.  Have someone help you at home for 1-2 weeks after your surgery.  Keep follow-up doctor visits as told. GET HELP IF:  You have chills or fever.  You have puffiness, redness, or pain in area of the cut (incision).  You have yellowish-white fluid (pus) coming from the cut.  You have a bad smell coming from the cut or bandage.  Your cut pulls apart.  You feel dizzy or light-headed.  You have pain  or bleeding when you pee.  You keep having watery poop (diarrhea).  You keep feeling sick to your stomach (nauseous) or keep throwing up (vomiting).  You have fluid (discharge) coming from your vagina.  You have a rash.  You have a reaction to your medicine.  You need stronger pain medicine. GET HELP RIGHT AWAY IF:   You have a fever and your symptoms suddenly get worse.  You have bad belly (abdominal) pain.  You have chest pain.  You are short of breath.  You pass out (faint).  You have pain, puffiness, or redness of your leg.  You bleed a lot from your vagina and notice clumps of tissue (clots). MAKE SURE YOU:    Understand these instructions.  Will watch your condition.  Will get help right away if you are not doing well or get worse.   This information is not intended to replace advice given to you by your health care provider. Make sure you discuss any questions you have with your health care provider.   Document Released: 08/17/2008 Document Revised: 11/13/2013 Document Reviewed: 08/31/2013 Elsevier Interactive Patient Education [employer].

## 2016-08-06 NOTE — Progress Notes (Signed)
Patient voiding qs, tolerating ambulation and regular diet. States pain relief adequate with oral pain medication. Discharge instructions discussed with patient until no further questions ask. Verbalizes when to call MD and lifting restrictions.  IV discontinued.

## 2016-08-10 ENCOUNTER — Encounter (HOSPITAL_COMMUNITY): Payer: Self-pay

## 2016-08-10 ENCOUNTER — Emergency Department (HOSPITAL_COMMUNITY)
Admission: EM | Admit: 2016-08-10 | Discharge: 2016-08-10 | Disposition: A | Discharge status: Home / Self Care | Payer: Medicare Other | Attending: Emergency Medicine | Admitting: Emergency Medicine

## 2016-08-10 DIAGNOSIS — Z8542 Personal history of malignant neoplasm of other parts of uterus: Secondary | ICD-10-CM | POA: Insufficient documentation

## 2016-08-10 DIAGNOSIS — M79661 Pain in right lower leg: Secondary | ICD-10-CM | POA: Diagnosis present

## 2016-08-10 DIAGNOSIS — I809 Phlebitis and thrombophlebitis of unspecified site: Secondary | ICD-10-CM | POA: Insufficient documentation

## 2016-08-10 DIAGNOSIS — Z79899 Other long term (current) drug therapy: Secondary | ICD-10-CM | POA: Diagnosis not present

## 2016-08-10 DIAGNOSIS — I80201 Phlebitis and thrombophlebitis of unspecified deep vessels of right lower extremity: Secondary | ICD-10-CM | POA: Diagnosis not present

## 2016-08-10 MED ORDER — ASPIRIN EC 325 MG PO TBEC: 325.0000 mg | DELAYED_RELEASE_TABLET | Freq: Every day | ORAL | 0 refills | Status: DC

## 2016-08-10 NOTE — ED Triage Notes (Signed)
Pt had a hysterectomy on Thursday and today she has a red warm and very tender area on her right lower leg

## 2016-08-11 NOTE — ED Provider Notes (Signed)
[facility] DEPT Provider Note   CSN: [id number] Arrival date & time: 08/10/16  1901     History   Chief Complaint Chief Complaint  Patient presents with  . Leg Pain    HPI [name] is a 65 y.o. female.  The history is provided by the patient.  Leg Pain   This is a new problem. The current episode started 12 to 24 hours ago. The problem occurs constantly. The problem has not changed since onset.The pain is present in the right lower leg. The quality of the pain is described as aching. The pain is moderate. Associated symptoms comments: Redness, pain. She has tried nothing for the symptoms. There has been no history of extremity trauma.    Past Medical History:  Diagnosis Date  . Arthritis    HANDS  . Borderline hypertension    ON  MEDS WENT OFF 2016 DUE TO DID NOT LIKE MEDS  . Cancer ([place]) 2017   ENDO METRIAL CANCER  . Chronic low back pain   . Degenerative spinal arthritis   . Depression   . Endometrial polyp   . Family history of adverse reaction to anesthesia    SISTER WITH SEVERE PONV  . Numbness in left leg    DUE TO BACK INJURY 2007  . Osteoporosis   . PMB (postmenopausal bleeding)   . Pneumonia 2015  . Wears glasses     Patient Active Problem List   Diagnosis Date Noted  . Endometrial cancer ([place]) 07/28/2016    Past Surgical History:  Procedure Laterality Date  . HYSTEROSCOPY W/D&C N/A 07/06/2016   Procedure: DILATATION AND CURETTAGE /HYSTEROSCOPY;  Surgeon: Molli Posey, MD;  Location: [facility];  Service: Gynecology;  Laterality: N/A;  . INCISION AND DRAINAGE BREAST ABSCESS Left 1977  . LUMBAR FUSION  2007   L1 -- L3  . ROBOTIC ASSISTED TOTAL HYSTERECTOMY WITH BILATERAL SALPINGO OOPHERECTOMY Bilateral 08/05/2016   Procedure: XI ROBOTIC ASSISTED TOTAL HYSTERECTOMY WITH BILATERAL SALPINGO OOPHORECTOMY SENTINAL LYMPH NODE BIOPSY;  Surgeon: Everitt Amber, MD;  Location: [facility];  Service: Gynecology;  Laterality: Bilateral;     OB History    No data available       Home Medications    Prior to Admission medications   Medication Sig Start Date End Date Taking? Authorizing Provider  gabapentin (NEURONTIN) 300 MG capsule Take 300 mg by mouth 4 (four) times daily.   Yes Historical Provider, MD  oxyCODONE-acetaminophen (PERCOCET) 7.5-325 MG tablet Take 2 tablets by mouth every 8 (eight) hours as needed for severe pain.    Yes Historical Provider, MD  aspirin EC 325 MG tablet Take 1 tablet (325 mg total) by mouth daily. 08/10/16   Leo Grosser, MD    Family History Family History  Problem Relation Age of Onset  . Cancer Mother     leukemia    Social History Social History  Substance Use Topics  . Smoking status: Never Smoker  . Smokeless tobacco: Never Used  . Alcohol use No     Allergies   Review of patient's allergies indicates no known allergies.   Review of Systems Review of Systems  All other systems reviewed and are negative.    Physical Exam Updated Vital Signs BP 136/93 (BP Location: Right Arm)   Pulse 94   Temp 97.9 F (36.6 C) (Oral)   Resp 18   SpO2 98%   Physical Exam  Constitutional: She is oriented to person, place, and time. She appears  well-developed and well-nourished. No distress.  HENT:  Head: Normocephalic.  Eyes: Conjunctivae are normal.  Neck: Neck supple. No tracheal deviation present.  Cardiovascular: Normal rate and regular rhythm.   Pulmonary/Chest: Effort normal. No respiratory distress.  Abdominal: Soft. She exhibits no distension.  Musculoskeletal:       Right lower leg: She exhibits tenderness (and 2 cm of slight erythema overlying right anteriror lower leg).  Neurological: She is alert and oriented to person, place, and time.  Skin: Skin is warm and dry.  Psychiatric: She has a normal mood and affect.     ED Treatments / Results  Labs (all labs ordered are listed, but only abnormal results are displayed) Labs Reviewed - No data to  display  EKG  EKG Interpretation None       Radiology No results found.  Procedures Procedures (including critical care time)  Emergency Focused Ultrasound Exam Limited Ultrasound of Soft Tissue   Performed and interpreted by Dr. Laneta Simmers Indication: evaluation for infection or foreign body Transverse and Sagittal views of right lower extremity are obtained in real time for the purposes of evaluation of skin and underlying soft tissues.  Findings: no heterogeneous fluid collection, no hyperemia/edema of surrounding tissue, noncompressible superficial vein in subcutaneous tissue Interpretation: no abscess, no cellulitis, superficial thombophlebitis Images archived electronically.  CPT Codes:   Lower extremity [phone]  Other soft tissue [id number]   Medications Ordered in ED Medications - No data to display   Initial Impression / Assessment and Plan / ED Course  I have reviewed the triage vital signs and the nursing notes.  Pertinent labs & imaging results that were available during my care of the patient were reviewed by me and considered in my medical decision making (see chart for details).  Clinical Course   64 y.o. female presents with right lower leg area of tenderness. Recent surgery for hysterectomy. No clinical evidence of DVT with soft compartments and no swelling behind knee, in calf, or in groin. Localized area of inflammation and findings on bedside US c/w superficial thrombophlebitis without DVT. Recommended ASA and warm compresses for supportive care. Plan to follow up with PCP as needed and return precautions discussed for worsening or new concerning symptoms.   Final Clinical Impressions(s) / ED Diagnoses   Final diagnoses:  Superficial thrombophlebitis    New Prescriptions Discharge Medication List as of 08/10/2016  9:48 PM    START taking these medications   Details  aspirin EC 325 MG tablet Take 1 tablet (325 mg total) by mouth daily., Starting Tue  08/10/2016, Print         Leo Grosser, MD 08/11/16 [phone]

## 2016-08-13 ENCOUNTER — Telehealth: Payer: Self-pay | Admitting: Gynecologic Oncology

## 2016-08-13 NOTE — Telephone Encounter (Signed)
Informed patient of diagnosis of stage IB2 cervical cancer with positive nodes. Recommend adjuvant chemoradiation.  [name], MD

## 2016-08-16 ENCOUNTER — Encounter: Payer: Self-pay | Admitting: Gynecologic Oncology

## 2016-08-16 NOTE — Progress Notes (Signed)
Gynecologic Oncology Multi-Disciplinary Disposition Conference Note  Date of the Conference: August 16, 2016  Patient Name: [name]  Referring Provider: Dr. Matthew Saras Primary GYN Oncologist: Dr. Everitt Amber  Stage/Disposition:  Stage IB2 endocervical cancer, possible Stage IA, Gr 2 endometrial cancer.  Disposition is to chemoradiation.  Baseline imaging with PET or CT CAP.   This Multidisciplinary conference took place involving physicians from [place], [place], Radiation Oncology, Pathology, Radiology along with the Gynecologic Oncology Nurse Practitioner and RN.  Comprehensive assessment of the patient's malignancy, staging, need for surgery, chemotherapy, radiation therapy, and need for further testing were reviewed. Supportive measures, both inpatient and following discharge were also discussed. The recommended plan of care is documented. Greater than 35 minutes were spent correlating and coordinating this patient's care.

## 2016-08-18 DIAGNOSIS — M961 Postlaminectomy syndrome, not elsewhere classified: Secondary | ICD-10-CM | POA: Diagnosis not present

## 2016-08-18 DIAGNOSIS — M47817 Spondylosis without myelopathy or radiculopathy, lumbosacral region: Secondary | ICD-10-CM | POA: Diagnosis not present

## 2016-08-18 DIAGNOSIS — M1288 Other specific arthropathies, not elsewhere classified, other specified site: Secondary | ICD-10-CM | POA: Diagnosis not present

## 2016-08-18 DIAGNOSIS — Z79899 Other long term (current) drug therapy: Secondary | ICD-10-CM | POA: Diagnosis not present

## 2016-08-18 DIAGNOSIS — Z79891 Long term (current) use of opiate analgesic: Secondary | ICD-10-CM | POA: Diagnosis not present

## 2016-08-18 DIAGNOSIS — G894 Chronic pain syndrome: Secondary | ICD-10-CM | POA: Diagnosis not present

## 2016-08-22 ENCOUNTER — Other Ambulatory Visit: Payer: Self-pay | Admitting: Oncology

## 2016-08-22 DIAGNOSIS — C530 Malignant neoplasm of endocervix: Secondary | ICD-10-CM

## 2016-08-23 ENCOUNTER — Encounter: Payer: Self-pay | Admitting: Gynecologic Oncology

## 2016-08-23 ENCOUNTER — Ambulatory Visit: Payer: Medicare Other | Attending: Gynecologic Oncology | Admitting: Gynecologic Oncology

## 2016-08-23 DIAGNOSIS — C530 Malignant neoplasm of endocervix: Secondary | ICD-10-CM | POA: Insufficient documentation

## 2016-08-23 DIAGNOSIS — Z79899 Other long term (current) drug therapy: Secondary | ICD-10-CM | POA: Insufficient documentation

## 2016-08-23 DIAGNOSIS — C539 Malignant neoplasm of cervix uteri, unspecified: Secondary | ICD-10-CM | POA: Diagnosis not present

## 2016-08-23 DIAGNOSIS — Z7189 Other specified counseling: Secondary | ICD-10-CM | POA: Diagnosis not present

## 2016-08-23 DIAGNOSIS — Z7982 Long term (current) use of aspirin: Secondary | ICD-10-CM | POA: Diagnosis not present

## 2016-08-23 NOTE — Progress Notes (Signed)
Consult Note: Gyn-Onc  Consult was requested by Dr. Matthew Saras for the evaluation of [name] 65 y.o. female  CC:  Chief Complaint  Patient presents with  . cervical adenocarcinoma    follow up    Assessment/Plan:  [name]  is a 65 y.o.  year old with stage IB2, lymph node positive endocervical adenocarcinoma.  Her pathology from her original biopsy has been compared with her hysterectomy specimen, and her preoperative sampling specimen which had originally been described as endometrial cancer, has been confirmed to be the same tumor as the endocervical tissue. Therefore the favored diagnosis is cervical cancer rather than synchronous endometrial and cervical cancer.  I discussed her pathology results with [name] including the positive lymph nodes. Due to this finding, in addition to the 4cm tumor with outer half cervical stromal invasion, she meets high risk features and according to NCCN guidelines we recommend adjuvant chemoradiation.  We will first establish the extent of her metastatic disease with a PET scan and have her meet with Dr Sondra Come and Dr Marko Plume to further plan for chemoradiation to commence approximately 4-6 weeks postop.  HPI: [name] is a 65 year old G3P3 who is seen in consultation at the request of Dr Matthew Saras for grade 2 endometrial cancer.   The patient reports a history of postmenopausal bleeding since June 2017. She underwent a sonohysterogram in Dr. Delanna Ahmadi office on 06/01/2016 that demonstrated a normal sized uterus measuring 6.5 x 2.6 x 2.6 cm but with an endometrial polyp measuring 2.4 cm. She was taken to the operating room on 07/06/2016 for a minor sure resection of the polyp. Final pathology from this revealed a grade 2 endometrioid adenocarcinoma.  The patient is otherwise a fairly healthy woman. Her major medical history significant for a horseback riding accident from which she required repair of L1-2 and 3 through a lateral  approach on the left. She has a flank incision from this. She has persistent left lower extremity weakness, and paresthesia secondary to this. She also has chronic opiate use and takes Percocet 7.5 mg 3 times a day and Neurontin 3 times a day due to this pain.  She is a striking family history for malignancy. Her son died at age 79 of sarcoma. Her sister has a history of leukemia, and her mother also died of leukemia. She is maternal grandmother with a history of colon cancer and a maternal great aunt with history of colon cancer.  Interval Hx: On 9.14.17 she underwent a robotic assisted total hysterectomy (extrafascial) with SLN biopsy for presumed grade 2 endometrial cancer. Final pathology revealed a moderately differentiated endocervical adenocarcinoma. It infiltrated 10 of 76mm (66%) of the cervical stroma, was positive for LVSI, measured 4cm, 44mm margin with the paracervical tissues and was associated with 2 positive SLN's (1 ITC, 1 micrometastasis).   She did well postoperatively with no major complaints.  Current Meds:  Outpatient Encounter Prescriptions as of 08/23/2016  Medication Sig  . aspirin EC 325 MG tablet Take 1 tablet (325 mg total) by mouth daily.  Marland Kitchen gabapentin (NEURONTIN) 300 MG capsule Take 300 mg by mouth 4 (four) times daily.  Marland Kitchen oxyCODONE-acetaminophen (PERCOCET) 7.5-325 MG tablet Take 2 tablets by mouth every 8 (eight) hours as needed for severe pain.    No facility-administered encounter medications on file as of 08/23/2016.     Allergy: No Known Allergies  Social Hx:   Social History   Social History  . Marital status: Divorced  Spouse name: N/A  . Number of children: N/A  . Years of education: N/A   Occupational History  . Not on file.   Social History Main Topics  . Smoking status: Never Smoker  . Smokeless tobacco: Never Used  . Alcohol use No  . Drug use: No  . Sexual activity: Not on file   Other Topics Concern  . Not on file   Social History  Narrative  . No narrative on file    Past Surgical Hx:  Past Surgical History:  Procedure Laterality Date  . HYSTEROSCOPY W/D&C N/A 07/06/2016   Procedure: DILATATION AND CURETTAGE /HYSTEROSCOPY;  Surgeon: Molli Posey, MD;  Location: [facility];  Service: Gynecology;  Laterality: N/A;  . INCISION AND DRAINAGE BREAST ABSCESS Left 1977  . LUMBAR FUSION  2007   L1 -- L3  . ROBOTIC ASSISTED TOTAL HYSTERECTOMY WITH BILATERAL SALPINGO OOPHERECTOMY Bilateral 08/05/2016   Procedure: XI ROBOTIC ASSISTED TOTAL HYSTERECTOMY WITH BILATERAL SALPINGO OOPHORECTOMY SENTINAL LYMPH NODE BIOPSY;  Surgeon: Everitt Amber, MD;  Location: [facility];  Service: Gynecology;  Laterality: Bilateral;    Past Medical Hx:  Past Medical History:  Diagnosis Date  . Arthritis    HANDS  . Borderline hypertension    ON  MEDS WENT OFF 2016 DUE TO DID NOT LIKE MEDS  . Cancer ([place]) 2017   ENDO METRIAL CANCER  . Chronic low back pain   . Degenerative spinal arthritis   . Depression   . Endometrial polyp   . Family history of adverse reaction to anesthesia    SISTER WITH SEVERE PONV  . Numbness in left leg    DUE TO BACK INJURY 2007  . Osteoporosis   . PMB (postmenopausal bleeding)   . Pneumonia 2015  . Wears glasses     Past Gynecological History:  G3P3 SVD'x No LMP recorded. Patient has had a hysterectomy.  Family Hx:  Family History  Problem Relation Age of Onset  . Cancer Mother     leukemia    Review of Systems:  Constitutional  Feels well,    ENT Normal appearing ears and nares bilaterally Skin/Breast  No rash, sores, jaundice, itching, dryness Cardiovascular  No chest pain, shortness of breath, or edema  Pulmonary  No cough or wheeze.  Gastro Intestinal  No nausea, vomitting, or diarrhoea. No bright red blood per rectum, no abdominal pain, change in bowel movement, or constipation.  Genito Urinary  No frequency, urgency, dysuria, no bleeding Musculo Skeletal  No myalgia,  arthralgia, joint swelling or pain  Neurologic  No weakness, numbness, change in gait,  Psychology  No depression, anxiety, insomnia.   Vitals:  Blood pressure 129/86, pulse 73, temperature 98 F (36.7 C), temperature source Oral, resp. rate 20, height 5\' 2"  (1.575 m), SpO2 99 %.  Physical Exam: WD in NAD Neck  Supple NROM, without any enlargements.  Lymph Node Survey No cervical supraclavicular or inguinal adenopathy Cardiovascular  Pulse normal rate, regularity and rhythm. S1 and S2 normal.  Lungs  Clear to auscultation bilateraly, without wheezes/crackles/rhonchi. Good air movement.  Skin  No rash/lesions/breakdown  Psychiatry  Alert and oriented to person, place, and time  Abdomen  Normoactive bowel sounds, abdomen soft, non-tender and nonobese without evidence of hernia. Well healed incisions. Back No CVA tenderness Genito Urinary  Vulva/vagina: Normal external female genitalia.  No lesions. No discharge or bleeding.  Bladder/urethra:  No lesions or masses, well supported bladder  Vagina: normal well healed vaginal cuff with no  blood or lesions. Rectal  deferred Extremities  No bilateral cyanosis, clubbing or edema.   30 minutes of direct face to face counseling time was spent with the patient. This included discussion about prognosis, therapy recommendations and postoperative side effects and are beyond the scope of routine postoperative care.  [name], MD  08/24/2016, 10:56 PM

## 2016-08-23 NOTE — Patient Instructions (Signed)
Plan to have a PET scan to evaluate for metastatic disease.  We will also have you meet with Dr. Gery Pray in Radiation and Dr. Evlyn Clines, Medical Oncologist.  We will call you with the appointments.

## 2016-08-24 ENCOUNTER — Encounter: Payer: Self-pay | Admitting: Gynecologic Oncology

## 2016-08-26 ENCOUNTER — Encounter: Payer: Self-pay | Admitting: *Deleted

## 2016-08-26 ENCOUNTER — Other Ambulatory Visit: Payer: Medicare Other

## 2016-08-27 ENCOUNTER — Ambulatory Visit (HOSPITAL_BASED_OUTPATIENT_CLINIC_OR_DEPARTMENT_OTHER): Payer: Medicare Other | Admitting: Oncology

## 2016-08-27 ENCOUNTER — Other Ambulatory Visit (HOSPITAL_BASED_OUTPATIENT_CLINIC_OR_DEPARTMENT_OTHER): Payer: Medicare Other

## 2016-08-27 ENCOUNTER — Other Ambulatory Visit: Payer: Self-pay | Admitting: Oncology

## 2016-08-27 VITALS — BP 131/82 | HR 70 | Temp 97.5°F | Resp 18 | Ht 62.0 in | Wt 145.3 lb

## 2016-08-27 DIAGNOSIS — C530 Malignant neoplasm of endocervix: Secondary | ICD-10-CM

## 2016-08-27 DIAGNOSIS — C539 Malignant neoplasm of cervix uteri, unspecified: Secondary | ICD-10-CM

## 2016-08-27 DIAGNOSIS — M545 Low back pain: Secondary | ICD-10-CM | POA: Diagnosis not present

## 2016-08-27 DIAGNOSIS — G8921 Chronic pain due to trauma: Secondary | ICD-10-CM

## 2016-08-27 DIAGNOSIS — M810 Age-related osteoporosis without current pathological fracture: Secondary | ICD-10-CM | POA: Diagnosis not present

## 2016-08-27 DIAGNOSIS — Z806 Family history of leukemia: Secondary | ICD-10-CM

## 2016-08-27 DIAGNOSIS — Z9189 Other specified personal risk factors, not elsewhere classified: Secondary | ICD-10-CM

## 2016-08-27 DIAGNOSIS — G8929 Other chronic pain: Secondary | ICD-10-CM | POA: Diagnosis not present

## 2016-08-27 DIAGNOSIS — K5900 Constipation, unspecified: Secondary | ICD-10-CM

## 2016-08-27 DIAGNOSIS — Z808 Family history of malignant neoplasm of other organs or systems: Secondary | ICD-10-CM | POA: Diagnosis not present

## 2016-08-27 DIAGNOSIS — K5903 Drug induced constipation: Secondary | ICD-10-CM

## 2016-08-27 DIAGNOSIS — Z800 Family history of malignant neoplasm of digestive organs: Secondary | ICD-10-CM

## 2016-08-27 LAB — COMPREHENSIVE METABOLIC PANEL
ALBUMIN: 3.8 g/dL (ref 3.5–5.0)
ALK PHOS: 112 U/L (ref 40–150)
ALT: 11 U/L (ref 0–55)
AST: 19 U/L (ref 5–34)
Anion Gap: 12 mEq/L — ABNORMAL HIGH (ref 3–11)
BILIRUBIN TOTAL: 0.32 mg/dL (ref 0.20–1.20)
BUN: 10.9 mg/dL (ref 7.0–26.0)
CO2: 26 meq/L (ref 22–29)
Calcium: 10 mg/dL (ref 8.4–10.4)
Chloride: 104 mEq/L (ref 98–109)
Creatinine: 0.9 mg/dL (ref 0.6–1.1)
EGFR: 66 mL/min/{1.73_m2} — ABNORMAL LOW (ref >90)
GLUCOSE: 111 mg/dL (ref 70–140)
Potassium: 4.3 mEq/L (ref 3.5–5.1)
SODIUM: 142 meq/L (ref 136–145)
TOTAL PROTEIN: 7.5 g/dL (ref 6.4–8.3)

## 2016-08-27 LAB — CBC WITH DIFFERENTIAL/PLATELET
BASO%: 0.9 % (ref 0.0–2.0)
BASOS ABS: 0.1 10*3/uL (ref 0.0–0.1)
EOS%: 2.5 % (ref 0.0–7.0)
Eosinophils Absolute: 0.2 10*3/uL (ref 0.0–0.5)
HCT: 40.7 % (ref 34.8–46.6)
HEMOGLOBIN: 13.6 g/dL (ref 11.6–15.9)
LYMPH#: 1.9 10*3/uL (ref 0.9–3.3)
LYMPH%: 25.5 % (ref 14.0–49.7)
MCH: 31.8 pg (ref 25.1–34.0)
MCHC: 33.4 g/dL (ref 31.5–36.0)
MCV: 95.4 fL (ref 79.5–101.0)
MONO#: 0.5 10*3/uL (ref 0.1–0.9)
MONO%: 7.2 % (ref 0.0–14.0)
NEUT#: 4.7 10*3/uL (ref 1.5–6.5)
NEUT%: 63.9 % (ref 38.4–76.8)
Platelets: 315 10*3/uL (ref 145–400)
RBC: 4.26 10*6/uL (ref 3.70–5.45)
RDW: 12.3 % (ref 11.2–14.5)
WBC: 7.4 10*3/uL (ref 3.9–10.3)

## 2016-08-27 MED ORDER — ONDANSETRON HCL 8 MG PO TABS: 8.0000 mg | ORAL_TABLET | Freq: Three times a day (TID) | ORAL | 0 refills | Status: DC | PRN

## 2016-08-27 MED ORDER — LORAZEPAM 0.5 MG PO TABS: ORAL_TABLET | ORAL | 0 refills | Status: DC

## 2016-08-27 NOTE — Progress Notes (Signed)
[facility] NEW PATIENT EVALUATION   Name: [name] Date: August 27, 2016  MRN: [id number] DOB: 24-Jun-1951  REFERRING PHYSICIAN: E.Rossi cc Deland Pretty, MD (PCP), Ocie Cornfield,  Renie Ora with [facility], Glenna Fellows) New patient consultation Dr Sondra Come 09-06-16   REASON FOR REFERRAL: IB2 endocervical adenocarcinoma   HISTORY OF PRESENT ILLNESS:[name] is a 65 y.o. female who is seen in consultation, alone for visit, at the request of Dr Denman George for consideration of sensitizing chemotherapy with radiation for recently diagnosed IB2 adenocarcinoma of endocervix.   Patient had intermittent vaginal spotting beginning ~ 04-2016. She had sonohystogram by Dr Matthew Saras 06-01-16 with 2.4 cm endometrial polyp identified. 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 She saw Dr Denman George in consultation, then had robotic assisted TAH BSO sentinel node on 08-05-16. Surgical pathology [id number] invasive adenocarcinoma of endocervix with isolated tumor cells in right external iliac node and micromet in left external iliac node. Review of path confirms endocervical primary without synchronous endometrial carcinoma. Recommendation at present is radiation with sensitizing chemotherapy. She saw Dr Denman George on 08-23-16, recovering from surgery without complications. She attended chemotherapy education class prior to this visit. PET is scheduled for 08-31-16. Patient has consultation with Dr Sondra Come planned 09-06-16.    REVIEW OF SYSTEMS  Stable weight. No other bleeding. No abdominal or pelvic pain. Some increase in chronic constipation since surgery, will increase bowel program. No bladder symptoms. No fever or other symptoms of infection. 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 Followed monthly by pain  management, medications stable past 6 months. Occasional HA unchanged. Good visual acuity with glasses. No hearing problems. No dentist, several broken teeth which are painful. No thyroid problems. No respiratory problems. No cardiac symptoms. Physically deconditioned and does not tolerate much activity due to chronic back problems, tho prior to the accident she was in good physical condition. Due mammograms. Appetite not as good since surgery, no N/V. No neuropathy in hands. Superficial blood clot right calf post op, not on home lovenox post op.   Remainder of full 10 point review of systems negative.   ALLERGIES: Review of patient's allergies indicates no known allergies.  PAST MEDICAL/ SURGICAL HISTORY:    G3P3 Chronic pain due to L2-3-4 horseback riding injury 2007, including burst fracture L2, surgery with lateral fusion by Dr Glenna Fellows 07-04-2006. Followed by [facility], Dr Andree Elk  Osteoporosis Mammograms last at [facility] office ~ 2 years ago Colonoscopy never Remote breast abscess  CURRENT MEDICATIONS: reviewed as listed now in EMR  PHARMACY   [facility]   SOCIAL HISTORY:  From [place], lives [place]. Divorced. Daughter and 3 grandchildren live with her (grands 21,19,8) and son in [place] with 2 girls ages 39 and 71. No tobacco or ETOH. No transfusions. Worked in [employer]. Not able to keep horses after accident.  FAMILY HISTORY:  Son died of osteosarcoma 10 years ago, at age 26. Care initially at [facility], then at [facility] with chemotherapy and 32 hour surgery, "cancer free for 3 days". Son had PAC. Mother died acute leukemia age 105 Sister with leukemia, living Maternal grandmother colon ca Maternal great aunt colon ca 3 sisters, 2 brothers: Sister DM, cardiac disease.Sister cardiac disease Brother died MI Son and daughter healthy.  Grandchildren healthy  PHYSICAL EXAM:  height is '5\' 2"'$  (1.575 m) and weight is 145  lb 4.8 oz (65.9 kg). Her oral temperature is 97.5 F (36.4 C). Her blood pressure is 131/82 and her pulse is 70. Her respiration is 18 and oxygen saturation is 98%.  Alert, pleasant, cooperative lady, looks stated age, walks slowly with cane. Good historian. Not in any acute distress.  HEENT:normal hair pattern. PERRL, not icteric. Oral mucosa moist and clear, posterior pharynx also. Amalgam fillings, broken teeth including lower molars on left. Neck supple, no JVD or thyroid mass.   RESPIRATORY: lungs clear to A and P  CARDIAC/ VASCULAR: heart RRR, no gallop. Peripheral pulses symmetrical and intact  ABDOMEN: soft not tender, not distended. Incisions from laparoscopic surgery closed and appear to be healing well. No HSM or mass. Bowel sounds occasional. No apparent fluid  LYMPH NODES: no cervical, supraclavicular, axillary or inguinal adenopathy  BREASTS: bilaterally without dominant mass, skin or nipple findings of concern  NEUROLOGIC: CN, motor, cerebellar grossly intact. Decreased sensation feet bilaterally.   PSYCH appropriate mood and affect  SKIN: without rash, ecchymosis, petechiae  MUSCULOSKELETAL: well healed scar from lumbar surgery. No swelling LE. Muscle mass symmetrical.     LABORATORY DATA:  Results for orders placed or performed in visit on 08/27/16 (from the past 48 hour(s))  CBC with Differential     Status: None   Collection Time: 08/27/16  7:58 AM  Result Value Ref Range   WBC 7.4 3.9 - 10.3 10e3/uL   NEUT# 4.7 1.5 - 6.5 10e3/uL   HGB 13.6 11.6 - 15.9 g/dL   HCT 40.7 34.8 - 46.6 %   Platelets 315 145 - 400 10e3/uL   MCV 95.4 79.5 - 101.0 fL   MCH 31.8 25.1 - 34.0 pg   MCHC 33.4 31.5 - 36.0 g/dL   RBC 4.26 3.70 - 5.45 10e6/uL   RDW 12.3 11.2 - 14.5 %   lymph# 1.9 0.9 - 3.3 10e3/uL   MONO# 0.5 0.1 - 0.9 10e3/uL   Eosinophils Absolute 0.2 0.0 - 0.5 10e3/uL   Basophils Absolute 0.1 0.0 - 0.1 10e3/uL   NEUT% 63.9 38.4 - 76.8 %   LYMPH% 25.5 14.0 - 49.7 %    MONO% 7.2 0.0 - 14.0 %   EOS% 2.5 0.0 - 7.0 %   BASO% 0.9 0.0 - 2.0 %     CMET available after visit normal with exception of AG and EGFR, including creat 0.9, electrolytes, T bili 0.32 + other LFTs  PATHOLOGY: TIAIRA, ARAMBULA C Collected: 07/06/2016 Client: [facility] Accession: [id number] Received: 07/06/2016 Molli Posey, MDREPORT OF SURGICAL PATHOLOGYNAL DIAGNOSIS Diagnosis Endometrium, curettage - ENDOMETRIOID ADENOCARCINOMA, SEE COMMENT. Microscopic Comment The carcinoma appears FIGO grade 2.    FINAL for SHENELLE, KLAS ([id number]) Patient: JENNILYN, ESTEVE Collected: 08/05/2016 Client: [facility] Accession: [id number] Received: 08/05/2016 Everitt Amber, MDREPORT OF SURGICAL PATHOLOGYNAL DIAGNOSIS Diagnosis 1. Lymph node, sentinel, biopsy, right external iliac - ISOLATED TUMOR CELLS. - SEE MICROSCOPIC DESCRIPTION. 2. Lymph node, sentinel, biopsy, left external iliac - MICROMETASTATIC CARCINOMA IN ONE LYMPH NODE (1/1). 3. Uterus, ovaries and fallopian tubes, cervix - INVASIVE ADENOCARCINOMA INVOLVING ENDOCERVIX AND ENDOCERVICAL CANAL. - SEE ONCOLOGY TABLE AND COMMENT. Microscopic Comment 3. UTERINE CERVIX Specimen: Uterus with bilateral fallopian tubes and ovaries and right and left external iliac sentinel lymph nodes. Procedure: Hysterectomy with bilateral salpingo-oophorectomy and sentinel lymph node biopsies. Tumor site (quadrant if known): Endocervix and endocervical canal Maximum tumor size (cm): 4 cm. Histologic  type: Adenocarcinoma, see comment. Grade: Intermediate. Margins: Free of tumor. Distance of carcinoma from closest margin: 0.2 cm from parametrial soft tissue margin. Depth of invasion: 10 mm in depth where cervix is 15 mm in thickness. Horizontal extent (mm): 40 mm (4 cm) Lymph-Vascular invasion: Present. Vaginal extension: N/A Uterine corpus extension: Extends into high endocervical canal and focally lower uterine  segment. Parametrial involvement: No. Lymph nodes: number examined - 2 ; number positive- 2 TNM code: pT1b2, pN1, pMX FIGO Stage (based on pathologic findings, need clinical correlation): IIIB Comment: The tumor is 4 cm in greatest dimension and involves the endocervix and endocervical canal and invades the cervical stroma to a depth of 1 cm where the cervix is measured as 1.5 cm in thickness. The tumor has villoglandular and endometrioid features. Immunohistochemistry shows diffuse strong positivity with p16 and patchy positivity with carcinoembryonic antigen while the tumor is negative with p53, estrogen receptor, progesterone receptor, and vimentin. The immunophenotype is more typical of an endocervical adenocarcinoma. In addition the entire endomyometrium is examined and no involvement by carcinoma of the endometrium is identified. There are a few microscopic foci with features suggesting precursor adenocarcinoma in situ. Immunohistochemistry for cytokeratin AE1/AE3 is performed and the right external iliac node shows positivity consistent with isolated tumor cells and the left external iliac node shows positive consistent with microscopic foci of metastatic carcinoma. No involvement of the endometrium, uterine serosa, bilateral ovaries and bilateral fallopian tubes is identified.     RADIOGRAPHY: CHEST  2 VIEW 08-03-16 COMPARISON:  05/22/2014  FINDINGS: PA and lateral views chest demonstrate linear and slightly nodular appearing opacity in the peripheral left lung base on the PA view. No acute consolidation or effusion. Cardiomediastinal silhouette within normal limits. No pneumothorax. Lumbar spine surgical hardware is similar compared to previous.  IMPRESSION: 1. No acute consolidation or infiltrate 2. Linear but slightly nodular opacity within the left lung base best seen on PA view, favored to represent atelectasis or possible scarring, however given slightly nodular  appearance, short interval radiographic follow-up is suggested.    PET pending 08-31-16    DISCUSSION: All of history above reviewed, including circumstances surrounding diagnosis and the pathology information. She is aware of PET upcoming, understands that gyn oncology will be following this information also and that plan for radiation and sensitizing chemotherapy will be confirmed when this information is available.  We have discussed rationale for radiation and sensitizing chemotherapy, including outpatient administration of chemotherapy, antiemetics, requirement for good oral and IV hydration with CDDP. She is familiar with PAC from her son's chemotherapy, but appears to have peripheral venous access likely very adequate for CDDP, and expect only ~ 5-6 treatments of that regimen. After this discussion, she is in agreement with beginning treatment with peripheral access.  I have encouraged her to have someone drive her to and from chemo appointments, sister probably will be able to do this. Discussed chronic constipation related to chronic narcotics, worse now post surgery and may have more constipation also with chemo (vs diarrhea with radiation). She will increase bowel program.   Verbal consent given for sensitizing CDDP.   IMPRESSION / PLAN:  1. IB2 endocervical adenocarcinoma: Path reviewed, conclusion that there is not a synchronous endometrial carcinoma. PET upcoming and consultation with Dr Sondra Come after PET. As long as no unexpected findings, likely sensitizing CDDP with radiation.  Chemotherapy and med onc MD appointments to be confirmed after radiation schedule is available. 2.chronic back pain related to lumbar spine injury 2007. On chronic  pain meds by pain management clinic. 3.constipation related to pain medication and recent surgery: increase laxatives 4.flu vaccine 08-06-16 5.family history osteosarcoma in son, leukemia mother and sister, colon cancer . May want genetics  counseling. 6.poor dentition with several broken teeth: referral made to [facility]. Recommended biotene mouthwash regularly for now  7.question of nodular opacity left lung base by pre op CXR. PET pending. Note pneumonia 2015 was RLL per xray reports then. 8.overdue mammograms, last at Dr [facility] office ~ 2 years ago. Patient may get this done prior to start of this treatment 9.never colonoscopy 10.osteoporosis per patient, no DEXA in this EMR    Patient had questions answered to her satisfaction and is in agreement with plan above. She can contact this office for questions or concerns at any time prior to next scheduled visit. Chemotherapy orders placed (non pathway diagnosis for [facility]). Message to managed care. Message to collaborative RNs as appointments need to be confirmed/ coordinated with radiation. Cc Drs Shelia Media, Wilmon Arms, Kulinski  Time spent 60 min including >50% counseling and coordination of care.    Evlyn Clines, MD 08/27/2016 8:32 AM

## 2016-08-29 ENCOUNTER — Encounter: Payer: Self-pay | Admitting: Oncology

## 2016-08-29 DIAGNOSIS — G8921 Chronic pain due to trauma: Secondary | ICD-10-CM | POA: Insufficient documentation

## 2016-08-29 DIAGNOSIS — K5903 Drug induced constipation: Secondary | ICD-10-CM | POA: Insufficient documentation

## 2016-08-29 NOTE — Progress Notes (Signed)
START ON PATHWAY REGIMEN -      

## 2016-08-30 ENCOUNTER — Encounter: Payer: Self-pay | Admitting: Oncology

## 2016-08-30 NOTE — Progress Notes (Signed)
GYN Location of Tumor / Histology: IB2 endocervical adenocarcinoma    [name] presented with symptoms of: history of postmenopausal bleeding since June 2017  Biopsies revealed:   08/05/16 Diagnosis 1. Lymph node, sentinel, biopsy, right external iliac - ISOLATED TUMOR CELLS. - SEE MICROSCOPIC DESCRIPTION. 2. Lymph node, sentinel, biopsy, left external iliac - MICROMETASTATIC CARCINOMA IN ONE LYMPH NODE (1/1). 3. Uterus, ovaries and fallopian tubes, cervix - INVASIVE ADENOCARCINOMA INVOLVING ENDOCERVIX AND ENDOCERVICAL CANAL. - SEE ONCOLOGY TABLE AND COMMENT.  07/06/16 Diagnosis Endometrium, curettage - ENDOMETRIOID ADENOCARCINOMA, SEE COMMENT.  Past/Anticipated interventions by Gyn/Onc surgery, if any: 08/05/16 - Procedure: XI ROBOTIC ASSISTED TOTAL HYSTERECTOMY WITH BILATERAL SALPINGO OOPHORECTOMY SENTINAL LYMPH NODE BIOPSY;  Surgeon: Everitt Amber, MD  Past/Anticipated interventions by medical oncology, if any: CDDP planned.   Weight changes, if any: no  Bowel/Bladder complaints, if any: She reports having constipation.  Last bm was Saturday.  She is taking colace and miralax.  Denies having bladder issues.  Nausea/Vomiting, if any: no  Pain issues, if any:  Yes - has chronic lower back pain.  Takes Percocet 6 times a day.  SAFETY ISSUES:  Prior radiation? no  Pacemaker/ICD? no  Possible current pregnancy? no  Is the patient on methotrexate? no  Current Complaints / other details:  PET scan on 08/31/16.  Dr. Denman George is recommending adjuvant chemoradiation.  BP 116/60 (BP Location: Left Arm, Patient Position: Sitting)   Pulse 76   Temp 98.4 F (36.9 C) (Oral)   Ht 5\' 2"  (1.575 m)   Wt 145 lb (65.8 kg)   SpO2 99%   BMI 26.52 kg/m    Wt Readings from Last 3 Encounters:  09/06/16 145 lb (65.8 kg)  08/27/16 145 lb 4.8 oz (65.9 kg)  08/05/16 143 lb (64.9 kg)

## 2016-08-30 NOTE — Progress Notes (Signed)
Attempted to call pt to discuss copay assistance w/ PAF, discuss the [facility] and [employer] grants but was unable to leave a message.  Will try and meet pt on 08/31/16.

## 2016-08-31 ENCOUNTER — Encounter: Payer: Self-pay | Admitting: Radiation Oncology

## 2016-08-31 ENCOUNTER — Encounter (HOSPITAL_COMMUNITY)
Admission: RE | Admit: 2016-08-31 | Discharge: 2016-08-31 | Disposition: A | Discharge status: Home / Self Care | Payer: Medicare Other | Source: Ambulatory Visit | Attending: Gynecologic Oncology | Admitting: Gynecologic Oncology

## 2016-08-31 DIAGNOSIS — C539 Malignant neoplasm of cervix uteri, unspecified: Secondary | ICD-10-CM

## 2016-08-31 LAB — GLUCOSE, CAPILLARY: Glucose-Capillary: 106 mg/dL — ABNORMAL HIGH (ref 65–99)

## 2016-08-31 MED ORDER — FLUDEOXYGLUCOSE F - 18 (FDG) INJECTION
7.2300 | Freq: Once | INTRAVENOUS | Status: AC | PRN
  Administered 2016-08-31: 7.23 via INTRAVENOUS

## 2016-09-02 ENCOUNTER — Ambulatory Visit (HOSPITAL_COMMUNITY): Payer: Self-pay | Admitting: Dentistry

## 2016-09-02 ENCOUNTER — Encounter (HOSPITAL_COMMUNITY): Payer: Self-pay | Admitting: Dentistry

## 2016-09-02 ENCOUNTER — Encounter (HOSPITAL_COMMUNITY): Payer: Self-pay | Admitting: *Deleted

## 2016-09-02 ENCOUNTER — Encounter (INDEPENDENT_AMBULATORY_CARE_PROVIDER_SITE_OTHER): Payer: Self-pay

## 2016-09-02 VITALS — BP 123/79 | HR 68 | Temp 98.1°F

## 2016-09-02 DIAGNOSIS — K036 Deposits [accretions] on teeth: Secondary | ICD-10-CM

## 2016-09-02 DIAGNOSIS — K0530 Chronic periodontitis, unspecified: Secondary | ICD-10-CM

## 2016-09-02 DIAGNOSIS — K0401 Reversible pulpitis: Secondary | ICD-10-CM

## 2016-09-02 DIAGNOSIS — K045 Chronic apical periodontitis: Secondary | ICD-10-CM

## 2016-09-02 DIAGNOSIS — K029 Dental caries, unspecified: Secondary | ICD-10-CM

## 2016-09-02 DIAGNOSIS — C539 Malignant neoplasm of cervix uteri, unspecified: Secondary | ICD-10-CM

## 2016-09-02 DIAGNOSIS — K08409 Partial loss of teeth, unspecified cause, unspecified class: Secondary | ICD-10-CM

## 2016-09-02 DIAGNOSIS — C530 Malignant neoplasm of endocervix: Secondary | ICD-10-CM

## 2016-09-02 DIAGNOSIS — Z01818 Encounter for other preprocedural examination: Secondary | ICD-10-CM

## 2016-09-02 DIAGNOSIS — M264 Malocclusion, unspecified: Secondary | ICD-10-CM

## 2016-09-02 DIAGNOSIS — K06020 Generalized gingival recession, unspecified: Secondary | ICD-10-CM

## 2016-09-02 DIAGNOSIS — K0889 Other specified disorders of teeth and supporting structures: Secondary | ICD-10-CM

## 2016-09-02 DIAGNOSIS — K083 Retained dental root: Secondary | ICD-10-CM

## 2016-09-02 NOTE — Progress Notes (Signed)
DENTAL CONSULTATION  Date of Consultation:  09/02/2016 Patient Name:   [name] Date of Birth:   September 29, 1951 Medical Record Number: [id number]  VITALS: BP 123/79 (BP Location: Right Arm)   Pulse 68   Temp 98.1 F (36.7 C) (Oral)    CHIEF COMPLAINT: Patient referred by Dr. Marko Plume for a dental consultation.  HPI: [name] is a 65 year old female recently diagnosed with endocervical adenocarcinoma. Patient is status post robotic total abdominal hysterectomy with bilateral salpingo-oophorectomy on 08/05/2016. Patient with anticipated chemoradiation therapy. Patient is now seen as part of a prechemoradiation therapy dental protocol examination to rule out dental infection that may affect the patient's systemic health while undergoing active chemoradiation therapy.  The patient has been experiencing dental pain involving the lower left premolar #20 and lower right molar #31. The patient describes acute, sharp pain that occurs on an intermittent basis over the past 2 years. Patient indicates that the pain as spontaneously at times. Patient indicates that the pain reaches intensity of 10 out of 10 but both teeth are currently 7 out of 10 in intensity.  Patient has not seen a dentist for approximately 20 years. Patient indictes that she saw Dr. Lenice Pressman at that time.  Patient has stopped seeing the dentist on a regular basis since she lost her dental insurance. She denies having dental phobia.  PROBLEM LIST: Patient Active Problem List   Diagnosis Date Noted  . Cervical cancer, FIGO stage IB2 (HCC) 08/23/2016    Priority: High  . Chronic pain due to injury 08/29/2016  . Constipation due to pain medication 08/29/2016    PMH: Past Medical History:  Diagnosis Date  . Arthritis    HANDS  . Borderline hypertension    ON  MEDS WENT OFF 2016 DUE TO DID NOT LIKE MEDS  . Cancer ([place]) 2017   ENDO METRIAL CANCER  . Chronic low back pain   . Degenerative spinal arthritis   .  Depression   . Endometrial polyp   . Family history of adverse reaction to anesthesia    SISTER WITH SEVERE PONV  . Numbness in left leg    DUE TO BACK INJURY 2007  . Osteoporosis   . PMB (postmenopausal bleeding)   . Pneumonia 2015  . Wears glasses     PSH: Past Surgical History:  Procedure Laterality Date  . HYSTEROSCOPY W/D&C N/A 07/06/2016   Procedure: DILATATION AND CURETTAGE /HYSTEROSCOPY;  Surgeon: Molli Posey, MD;  Location[facility];  Service: Gynecology;  Laterality: N/A;  . INCISION AND DRAINAGE BREAST ABSCESS Left 1977  . LUMBAR FUSION  2007   L1 -- L3  . ROBOTIC ASSISTED TOTAL HYSTERECTOMY WITH BILATERAL SALPINGO OOPHERECTOMY Bilateral 08/05/2016   Procedure: XI ROBOTIC ASSISTED TOTAL HYSTERECTOMY WITH BILATERAL SALPINGO OOPHORECTOMY SENTINAL LYMPH NODE BIOPSY;  Surgeon: Everitt Amber, MD;  Location: [facility];  Service: Gynecology;  Laterality: Bilateral;    ALLERGIES: No Known Allergies  MEDICATIONS: Current Outpatient Prescriptions  Medication Sig Dispense Refill  . docusate sodium (COLACE) 100 MG capsule Take 100 mg by mouth 2 (two) times daily.    Marland Kitchen gabapentin (NEURONTIN) 300 MG capsule Take 300 mg by mouth 4 (four) times daily.    Marland Kitchen LORazepam (ATIVAN) 0.5 MG tablet Place 1 tablet under the tongue or swallow every 6 hrs as needed for nausea. Will make drowsy. 20 tablet 0  . ondansetron (ZOFRAN) 8 MG tablet Take 1 tablet (8 mg total) by mouth every 8 (eight) hours as needed  for nausea (Will not make drowsy.). 30 tablet 0  . oxyCODONE-acetaminophen (PERCOCET) 7.5-325 MG tablet Take 2 tablets by mouth every 8 (eight) hours as needed for severe pain.     . polyethylene glycol (MIRALAX / GLYCOLAX) packet Take 17 g by mouth daily as needed for moderate constipation.     No current facility-administered medications for this visit.     LABS: Lab Results  Component Value Date   WBC 7.4 08/27/2016   HGB 13.6 08/27/2016   HCT 40.7 08/27/2016   MCV  95.4 08/27/2016   PLT 315 08/27/2016      Component Value Date/Time   NA 142 08/27/2016 0758   K 4.3 08/27/2016 0758   CL 103 08/06/2016 0447   CO2 26 08/27/2016 0758   GLUCOSE 111 08/27/2016 0758   BUN 10.9 08/27/2016 0758   CREATININE 0.9 08/27/2016 0758   CALCIUM 10.0 08/27/2016 0758   GFRNONAA >60 08/06/2016 0447   GFRAA >60 08/06/2016 0447   No results found for: INR, PROTIME No results found for: PTT  SOCIAL HISTORY: Social History   Social History  . Marital status: Divorced    Spouse name: N/A  . Number of children: N/A  . Years of education: N/A   Occupational History  . Not on file.   Social History Main Topics  . Smoking status: Never Smoker  . Smokeless tobacco: Never Used  . Alcohol use No  . Drug use: No  . Sexual activity: Not on file   Other Topics Concern  . Not on file   Social History Narrative  . No narrative on file    FAMILY HISTORY: Family History  Problem Relation Age of Onset  . Cancer Mother     leukemia    REVIEW OF SYSTEMS: Reviewed with patient as per history of present illness. Psych: Patient denies having dental phobia.  DENTAL HISTORY: CHIEF COMPLAINT: Patient referred by Dr. Marko Plume for a dental consultation.  HPI: [name] is a 65 year old female recently diagnosed with endocervical adenocarcinoma. Patient is status post robotic total abdominal hysterectomy with bilateral salpingo-oophorectomy on 08/05/2016. Patient with anticipated chemoradiation therapy. Patient is now seen as part of a prechemoradiation therapy dental protocol examination to rule out dental infection that may affect the patient's systemic health while undergoing active chemoradiation therapy.  The patient has been experiencing dental pain involving the lower left premolar #20 and lower right molar #31. The patient describes acute, sharp pain that occurs on an intermittent basis over the past 2 years. Patient indicates that the pain as  spontaneously at times. Patient indicates that the pain reaches intensity of 10 out of 10 but both teeth are currently 7 out of 10 in intensity.  Patient has not seen a dentist for approximately 20 years. Patient indictes that she saw Dr. Lenice Pressman at that time.  Patient has stopped seeing the dentist on a regular basis since she lost her dental insurance. She denies having dental phobia.   DENTAL EXAMINATION: GENERAL:Patient is a well-developed, well-nourished female in no acute distress. Patient walks with the aid of a cane.  HEAD AND NECK: There is no palpable neck lymphadenopathy. The patient denies acute TMJ symptoms. INTRAORAL EXAM: The patient has normal saliva. I do not see any evidence of oral abscess formation. DENTITION: She is missing tooth numbers 1, 3, 16, 30. There are retained roots in the area of tooth numbers 4, 17, 18, and 20. PERIODONTAL: Patient has chronic periodontitis with plaque and calculus accumulations,  gingival recession, and tooth mobility as per dental charting form. DENTAL CARIES/SUBOPTIMAL RESTORATIONS: There are rampant dental caries noted. Multiple dental caries are close to having pulpal impingement. ENDODONTIC: Patient has a history of acute pulpitis symptoms involving tooth numbers 20 and 31. There are multiple areas of periapical pathology and radiolucency. CROWN AND BRIDGE: There is a crown on tooth #19. PROSTHODONTIC: Patient denies having any partial dentures. OCCLUSION: Patient has a poor occlusal scheme secondary to multiple missing teeth, multiple retained root segments, and lack of replacement of all missing teeth with dental prostheses.  RADIOGRAPHIC INTERPRETATION: An orthopantogram was taken and supplemented with a full series of dental radiographs. There are multiple missing teeth. There are multiple retained root segments. There is incipient to moderate bone loss. There are multiple areas of periapical pathology and radiolucency. There is  supra-eruption and drifting of the unopposed teeth into the edentulous areas. Multiple diastemas are noted.  ASSESSMENTS: 1. Cervical cancer 2. Pre-chemoradiation therapy dental protocol 3. Acute pulpitis 4. Chronic apical periodontitis  5. Rampant dental caries 6. Multiple retained root segments 7. Chronic periodontitis with bone loss 8. Gingival recession 9. Accretions 10. Tooth mobility 11. Multiple missing teeth 12. Supra-eruption and drifting of the unopposed teeth into the edentulous areas  13. Malocclusion  PLAN/RECOMMENDATIONS: 1. I discussed the risks, benefits, and complications of various treatment options with the patient in relationship to her  medical and dental conditions, anticipated chemotherapy, and risk for infection. We discussed various treatment options to include no treatment, total and subtotal extractions with alveoloplasty, pre-prosthetic surgery as indicated, periodontal therapy, dental restorations, root canal therapy, crown and bridge therapy, implant therapy, and replacement of missing teeth as indicated. The patient currently wishes to proceed with extraction of tooth numbers 4, 5, 13, 14, 15, 17, 18, 20, 29, and 31 with alveoloplasty and gross debridement of remaining dentition in the operating room with general anesthesia. The patient understands that she has additional teeth with dental caries that may present a problem during the anticipated chemoradiation therapy in the future. However, the patient is adamant about NOT proceeding with additional extractions at this time and understands that she will need to follow-up with a dentist of her choice for dental restorations and other dental treatment as indicated once she has completed her chemoradiation therapy. The operating room procedure has been scheduled for Wednesday, 09/08/2016 8:30 AM at [facility].   2. Discussion of findings with medical team and coordination of future medical and dental care  as needed.  I spent in excess of  120 minutes during the conduct of this consultation and >50% of this time involved direct face-to-face encounter for counseling and/or coordination of the patient's care.    Lenn Cal, DDS

## 2016-09-03 ENCOUNTER — Telehealth: Payer: Self-pay | Admitting: Gynecologic Oncology

## 2016-09-03 NOTE — Telephone Encounter (Signed)
Patient informed of PET scan results.  She states she is scheduled to have some teeth extracted on Wednesday.  No concerns voiced.  Advised to call for any needs.  She is scheduled to see Dr. Sondra Come on Monday.

## 2016-09-06 ENCOUNTER — Encounter: Payer: Self-pay | Admitting: Radiation Oncology

## 2016-09-06 ENCOUNTER — Ambulatory Visit
Admission: RE | Admit: 2016-09-06 | Discharge: 2016-09-06 | Disposition: A | Discharge status: Home / Self Care | Payer: Medicare Other | Source: Ambulatory Visit | Attending: Radiation Oncology | Admitting: Radiation Oncology

## 2016-09-06 ENCOUNTER — Ambulatory Visit: Admission: RE | Admit: 2016-09-06 | Payer: Medicare Other | Source: Ambulatory Visit

## 2016-09-06 DIAGNOSIS — M810 Age-related osteoporosis without current pathological fracture: Secondary | ICD-10-CM | POA: Insufficient documentation

## 2016-09-06 DIAGNOSIS — Z806 Family history of leukemia: Secondary | ICD-10-CM | POA: Diagnosis not present

## 2016-09-06 DIAGNOSIS — M545 Low back pain: Secondary | ICD-10-CM | POA: Diagnosis not present

## 2016-09-06 DIAGNOSIS — Z510 Encounter for antineoplastic radiation therapy: Secondary | ICD-10-CM | POA: Insufficient documentation

## 2016-09-06 DIAGNOSIS — G8929 Other chronic pain: Secondary | ICD-10-CM | POA: Diagnosis not present

## 2016-09-06 DIAGNOSIS — Z79899 Other long term (current) drug therapy: Secondary | ICD-10-CM | POA: Diagnosis not present

## 2016-09-06 DIAGNOSIS — Z90722 Acquired absence of ovaries, bilateral: Secondary | ICD-10-CM | POA: Diagnosis not present

## 2016-09-06 DIAGNOSIS — Z8249 Family history of ischemic heart disease and other diseases of the circulatory system: Secondary | ICD-10-CM | POA: Diagnosis not present

## 2016-09-06 DIAGNOSIS — C539 Malignant neoplasm of cervix uteri, unspecified: Secondary | ICD-10-CM

## 2016-09-06 DIAGNOSIS — Z90710 Acquired absence of both cervix and uterus: Secondary | ICD-10-CM | POA: Diagnosis not present

## 2016-09-06 DIAGNOSIS — C530 Malignant neoplasm of endocervix: Secondary | ICD-10-CM | POA: Insufficient documentation

## 2016-09-06 NOTE — Progress Notes (Signed)
Radiation Oncology         [phone] ________________________________  Initial Outpatient Consultation  Name: [name] MRN: [id number]  Date: 09/06/2016  DOB: 04-May-1951  [id number] DAVIDSON, MD  Everitt Amber, MD   REFERRING PHYSICIAN: Everitt Amber, MD  DIAGNOSIS: The encounter diagnosis was Cervical cancer, FIGO stage IB2 ([place]).   Stage IIIB (pT1b2, pN1, pMX) endocervical adenocarcinoma  HISTORY OF PRESENT ILLNESS::[name] is a 65 y.o. female who is seen out courtesy of Dr. Denman George. The patient presented with intermittent vaginal spotting beginning ~ 04/2016. She had sonohystogram by Dr. Matthew Saras on 06/01/16 with a 2.4 cm endometrial polyp identified. Dr. Matthew Saras resected the polyp on 07/06/16 revealing endometrioid adenocarcinoma FIGO grade 2.  She saw Dr. Denman George in consultation and then had a robotic assisted TAH-BSO sentinel node on 08/05/16. Surgical pathology revealed invasive adenocarcinoma of endocervix and endocervical canal. The tumor was 4 cm in greatest dimension and invaded the cervical stroma to a depth of 1 cm where the cervix is measured as 1.5 cm in thickness. Distance of carcinoma from closest margin was 0.2 cm from the parametrial soft tissue margin.There were isolated tumor cells in a right external iliac node and micrometastic carcinoma in a left external iliac node. Review of path confirmed endocervical primary without synchronous endometrial carcinoma.  She saw Dr. Denman George on 08/23/16 and the recommendation is radiation with sensitizing chemotherapy. The patient consulted with Dr. Marko Plume on 08/27/16 who recommended 5-6 cycles of sensitizing CDDP with radiation.  PET scan on 08/31/16 revealed no hypermetabolic metastatic disease and a 8 mm RUL ground glass opacity (likely an area of post infectious/inflammatory scarring or an indolent neoplasm).  The patient presents today to discuss radiation therapy for the management of her disease.  PREVIOUS RADIATION  THERAPY: No  PAST MEDICAL HISTORY:  has a past medical history of Arthritis; Borderline hypertension; Cancer ([place]) (2017); Chronic low back pain; Degenerative spinal arthritis; Depression; Endometrial polyp; Family history of adverse reaction to anesthesia; Numbness in left leg; Osteoporosis; PMB (postmenopausal bleeding); Pneumonia (2015); and Wears glasses.    PAST SURGICAL HISTORY: Past Surgical History:  Procedure Laterality Date  . HYSTEROSCOPY W/D&C N/A 07/06/2016   Procedure: DILATATION AND CURETTAGE /HYSTEROSCOPY;  Surgeon: Molli Posey, MD;  Location: [facility];  Service: Gynecology;  Laterality: N/A;  . INCISION AND DRAINAGE BREAST ABSCESS Left 1977  . LUMBAR FUSION  2007   L1 -- L3  . ROBOTIC ASSISTED TOTAL HYSTERECTOMY WITH BILATERAL SALPINGO OOPHERECTOMY Bilateral 08/05/2016   Procedure: XI ROBOTIC ASSISTED TOTAL HYSTERECTOMY WITH BILATERAL SALPINGO OOPHORECTOMY SENTINAL LYMPH NODE BIOPSY;  Surgeon: Everitt Amber, MD;  Location: [facility];  Service: Gynecology;  Laterality: Bilateral;    FAMILY HISTORY: family history includes Cancer in her son; Cancer (age of onset: 91) in her mother; Heart disease (age of onset: 66) in her father; Leukemia in her sister.  SOCIAL HISTORY:  reports that she has never smoked. She has never used smokeless tobacco. She reports that she does not drink alcohol or use drugs.  ALLERGIES: Review of patient's allergies indicates no known allergies.  MEDICATIONS:  Current Outpatient Prescriptions  Medication Sig Dispense Refill  . docusate sodium (COLACE) 100 MG capsule Take 100 mg by mouth 2 (two) times daily.    Marland Kitchen gabapentin (NEURONTIN) 300 MG capsule Take 300 mg by mouth 4 (four) times daily.    Marland Kitchen oxyCODONE-acetaminophen (PERCOCET) 7.5-325 MG tablet Take 2 tablets by mouth every 8 (eight) hours as needed for severe pain.     Marland Kitchen  polyethylene glycol (MIRALAX / GLYCOLAX) packet Take 17 g by mouth daily as needed for moderate constipation.      Marland Kitchen LORazepam (ATIVAN) 0.5 MG tablet Place 1 tablet under the tongue or swallow every 6 hrs as needed for nausea. Will make drowsy. (Patient not taking: Reported on 09/06/2016) 20 tablet 0  . ondansetron (ZOFRAN) 8 MG tablet Take 1 tablet (8 mg total) by mouth every 8 (eight) hours as needed for nausea (Will not make drowsy.). (Patient not taking: Reported on 09/06/2016) 30 tablet 0   No current facility-administered medications for this encounter.     REVIEW OF SYSTEMS:  A 15 point review of systems is documented in the electronic medical record. This was obtained by the nursing staff. However, I reviewed this with the patient to discuss relevant findings and make appropriate changes.  Pertinent items noted in HPI and remainder of comprehensive ROS otherwise negative.  The patient reports right leg swelling after surgery that has since subsided.  PHYSICAL EXAM:  height is 5\' 2"  (1.575 m) and weight is 145 lb (65.8 kg). Her oral temperature is 98.4 F (36.9 C). Her blood pressure is 116/60 and her pulse is 76. Her oxygen saturation is 99%.   General: Alert and oriented, in no acute distress HEENT: Head is normocephalic. Extraocular movements are intact. Oropharynx is clear. Neck: Neck is supple, no palpable cervical or supraclavicular lymphadenopathy. Heart: Regular in rate and rhythm with no murmurs, rubs, or gallops. Chest: Clear to auscultation bilaterally, with no rhonchi, wheezes, or rales. Abdomen: Soft, nontender, nondistended, with no rigidity or guarding. Several small scars along the abdominal region from her recent surgery which is healing well without sign of infection or drainage. Extremities: No cyanosis or edema. Lymphatics: see Neck Exam Skin: No concerning lesions. Musculoskeletal: symmetric strength and muscle tone throughout. The patient is ambulatory with a cane. Slight dorsiflexion weakness with her left foot Neurologic: Cranial nerves II through XII are grossly intact. No  obvious focalities. Speech is fluent. Coordination is intact. Psychiatric: Judgment and insight are intact. Affect is appropriate. Pelvic: No inguinal adenopathy. On pelvic examination the external genitalia were unremarkable. A speculum exam was performed. Sutures visible along the vaginal cuff. There are no mucosal lesions noted in the vaginal vault. On bimanual examination the vaginal cuff is noted to be intact and there were no pelvic masses appreciated.  ECOG = 1   LABORATORY DATA:  Lab Results  Component Value Date   WBC 7.4 08/27/2016   HGB 13.6 08/27/2016   HCT 40.7 08/27/2016   MCV 95.4 08/27/2016   PLT 315 08/27/2016   NEUTROABS 4.7 08/27/2016   Lab Results  Component Value Date   NA 142 08/27/2016   K 4.3 08/27/2016   CL 103 08/06/2016   CO2 26 08/27/2016   GLUCOSE 111 08/27/2016   CREATININE 0.9 08/27/2016   CALCIUM 10.0 08/27/2016      RADIOGRAPHY: Nm Pet Image Initial (pi) Skull Base To Thigh  Result Date: 08/31/2016 CLINICAL DATA:  Initial treatment strategy for staging of cervical cancer. EXAM: NUCLEAR MEDICINE PET SKULL BASE TO THIGH TECHNIQUE: Seven point to mCi F-18 FDG was injected intravenously. Full-ring PET imaging was performed from the skull base to thigh after the radiotracer. CT data was obtained and used for attenuation correction and anatomic localization. FASTING BLOOD GLUCOSE:  Value: 106 mg/dl COMPARISON:  Chest radiograph 08/04/2015. FINDINGS: NECK No cervical nodal hypermetabolism. Left mandibular hypermetabolism likely corresponds to a periapical lucency, including on image 27/ series  4. CHEST No thoracic nodal or pulmonary parenchymal hypermetabolism. ABDOMEN/PELVIS No abdominal pelvic nodal or parenchymal hypermetabolism identified. SKELETON No suspicious focal osseous activity. Activity about the greater trochanters is likely due to bursitis and worse on the right. CT IMAGES PERFORMED FOR ATTENUATION CORRECTION No cervical adenopathy. Right upper  lobe ground-glass opacity measures 8 mm on image 24/series 8. Left base atelectasis or scar. L1-3 fusion with resultant mild beam hardening artifact. Colonic stool burden suggests constipation. No abdominal pelvic adenopathy. Hysterectomy. No adnexal mass. No postoperative complication identified. IMPRESSION: 1. No hypermetabolic metastatic disease identified. 2. 8 mm right upper lobe ground-glass opacity. Consider dedicated chest CT to serve as a baseline and allow surveillance. This could represent an area of post infectious/inflammatory scarring or an indolent neoplasm such as low-grade adenocarcinoma. 3.  Possible constipation. 4. Hypermetabolism in the left mandible, likely correlated with a mandibular periapical and lucency. Electronically Signed   By: Abigail Miyamoto M.D.   On: 08/31/2016 15:24      IMPRESSION: Stage IIIB (pT1b2, pN1, pMX) endocervical adenocarcinoma, status post TAH-BSO.  Patient is at risk for recurrence given her advanced stage. She would likely benefit from postoperative adjuvant treatment including radiation therapy along with sensitizing chemotherapy. The patient does have a close surgical margin and I will discuss with Dr. Denman George consideration for intracavitary brachytherapy treatment as adjuvant management.  PLAN: Today, I talked to the patient about the findings and work-up thus far.  We discussed the natural history of endocervical adenocarcinoma and general treatment, highlighting the role of radiotherapy in the management.  We discussed the available radiation techniques, and focused on the details of logistics and delivery.  We reviewed the anticipated acute and late sequelae associated with radiation in this setting.  The patient was encouraged to ask questions that I answered to the best of my ability.  The patient signed a consent form and we retained a copy for our records.  The patient would like to proceed with radiation and will be scheduled for CT simulation. Radiation  will start approximately 6 weeks post-op.     ------------------------------------------------  Blair Promise, PhD, MD  This document serves as a record of services personally performed by Gery Pray, MD. It was created on his behalf by Darcus Austin, a trained medical scribe. The creation of this record is based on the scribe's personal observations and the provider's statements to them. This document has been checked and approved by the attending provider.

## 2016-09-07 ENCOUNTER — Telehealth: Payer: Self-pay

## 2016-09-07 NOTE — Telephone Encounter (Signed)
ENCOUNTER OPENED IN ERROR

## 2016-09-08 ENCOUNTER — Encounter (HOSPITAL_COMMUNITY)
Admission: RE | Disposition: A | Discharge status: Home / Self Care | Payer: Self-pay | Source: Ambulatory Visit | Attending: Dentistry

## 2016-09-08 ENCOUNTER — Encounter (HOSPITAL_COMMUNITY): Payer: Self-pay

## 2016-09-08 ENCOUNTER — Ambulatory Visit (HOSPITAL_COMMUNITY)
Admission: RE | Admit: 2016-09-08 | Discharge: 2016-09-08 | Disposition: A | Discharge status: Home / Self Care | Payer: Medicare Other | Source: Ambulatory Visit | Attending: Dentistry | Admitting: Dentistry

## 2016-09-08 ENCOUNTER — Ambulatory Visit (HOSPITAL_COMMUNITY): Payer: Medicare Other | Admitting: Registered Nurse

## 2016-09-08 DIAGNOSIS — M545 Low back pain: Secondary | ICD-10-CM | POA: Diagnosis not present

## 2016-09-08 DIAGNOSIS — C530 Malignant neoplasm of endocervix: Secondary | ICD-10-CM | POA: Insufficient documentation

## 2016-09-08 DIAGNOSIS — Z800 Family history of malignant neoplasm of digestive organs: Secondary | ICD-10-CM | POA: Diagnosis not present

## 2016-09-08 DIAGNOSIS — G8929 Other chronic pain: Secondary | ICD-10-CM | POA: Diagnosis not present

## 2016-09-08 DIAGNOSIS — T40605A Adverse effect of unspecified narcotics, initial encounter: Secondary | ICD-10-CM | POA: Insufficient documentation

## 2016-09-08 DIAGNOSIS — Z90722 Acquired absence of ovaries, bilateral: Secondary | ICD-10-CM | POA: Diagnosis not present

## 2016-09-08 DIAGNOSIS — C539 Malignant neoplasm of cervix uteri, unspecified: Secondary | ICD-10-CM

## 2016-09-08 DIAGNOSIS — K045 Chronic apical periodontitis: Secondary | ICD-10-CM | POA: Diagnosis not present

## 2016-09-08 DIAGNOSIS — Z808 Family history of malignant neoplasm of other organs or systems: Secondary | ICD-10-CM | POA: Insufficient documentation

## 2016-09-08 DIAGNOSIS — K029 Dental caries, unspecified: Secondary | ICD-10-CM | POA: Insufficient documentation

## 2016-09-08 DIAGNOSIS — Z9079 Acquired absence of other genital organ(s): Secondary | ICD-10-CM | POA: Diagnosis not present

## 2016-09-08 DIAGNOSIS — K083 Retained dental root: Secondary | ICD-10-CM | POA: Insufficient documentation

## 2016-09-08 DIAGNOSIS — K036 Deposits [accretions] on teeth: Secondary | ICD-10-CM | POA: Insufficient documentation

## 2016-09-08 DIAGNOSIS — Z90710 Acquired absence of both cervix and uterus: Secondary | ICD-10-CM | POA: Diagnosis not present

## 2016-09-08 DIAGNOSIS — M810 Age-related osteoporosis without current pathological fracture: Secondary | ICD-10-CM | POA: Diagnosis not present

## 2016-09-08 DIAGNOSIS — C774 Secondary and unspecified malignant neoplasm of inguinal and lower limb lymph nodes: Secondary | ICD-10-CM | POA: Insufficient documentation

## 2016-09-08 DIAGNOSIS — K0530 Chronic periodontitis, unspecified: Secondary | ICD-10-CM | POA: Diagnosis not present

## 2016-09-08 DIAGNOSIS — K0401 Reversible pulpitis: Secondary | ICD-10-CM

## 2016-09-08 DIAGNOSIS — Z806 Family history of leukemia: Secondary | ICD-10-CM | POA: Diagnosis not present

## 2016-09-08 DIAGNOSIS — K5903 Drug induced constipation: Secondary | ICD-10-CM | POA: Insufficient documentation

## 2016-09-08 DIAGNOSIS — K0402 Irreversible pulpitis: Secondary | ICD-10-CM

## 2016-09-08 HISTORY — PX: MULTIPLE EXTRACTIONS WITH ALVEOLOPLASTY: SHX5342

## 2016-09-08 SURGERY — MULTIPLE EXTRACTION WITH ALVEOLOPLASTY
Anesthesia: General

## 2016-09-08 MED ORDER — DEXAMETHASONE SODIUM PHOSPHATE 10 MG/ML IJ SOLN
INTRAMUSCULAR | Status: AC
  Filled 2016-09-08: qty 1

## 2016-09-08 MED ORDER — LIDOCAINE 2% (20 MG/ML) 5 ML SYRINGE
INTRAMUSCULAR | Status: AC
  Filled 2016-09-08: qty 5

## 2016-09-08 MED ORDER — LIDOCAINE HCL (CARDIAC) 20 MG/ML IV SOLN
INTRAVENOUS | Status: DC | PRN
  Administered 2016-09-08: 100 mg via INTRAVENOUS

## 2016-09-08 MED ORDER — EPHEDRINE 5 MG/ML INJ
INTRAVENOUS | Status: AC
  Filled 2016-09-08: qty 10

## 2016-09-08 MED ORDER — DEXAMETHASONE SODIUM PHOSPHATE 10 MG/ML IJ SOLN
INTRAMUSCULAR | Status: DC | PRN
  Administered 2016-09-08: 10 mg via INTRAVENOUS

## 2016-09-08 MED ORDER — OXYMETAZOLINE HCL 0.05 % NA SOLN
NASAL | Status: DC | PRN
  Administered 2016-09-08 (×2): 2 via NASAL

## 2016-09-08 MED ORDER — BUPIVACAINE-EPINEPHRINE (PF) 0.5% -1:200000 IJ SOLN
INTRAMUSCULAR | Status: DC | PRN
  Administered 2016-09-08: 1.8 mL via PERINEURAL

## 2016-09-08 MED ORDER — SUCCINYLCHOLINE CHLORIDE 200 MG/10ML IV SOSY
PREFILLED_SYRINGE | INTRAVENOUS | Status: DC | PRN
  Administered 2016-09-08: 100 mg via INTRAVENOUS

## 2016-09-08 MED ORDER — OXYCODONE-ACETAMINOPHEN 5-325 MG PO TABS
1.0000 | ORAL_TABLET | ORAL | Status: DC | PRN
  Administered 2016-09-08: 1 via ORAL
  Filled 2016-09-08: qty 1

## 2016-09-08 MED ORDER — OXYMETAZOLINE HCL 0.05 % NA SOLN
NASAL | Status: AC
  Filled 2016-09-08: qty 15

## 2016-09-08 MED ORDER — PROPOFOL 10 MG/ML IV BOLUS
INTRAVENOUS | Status: DC | PRN
  Administered 2016-09-08: 150 mg via INTRAVENOUS

## 2016-09-08 MED ORDER — ISOPROPYL ALCOHOL 70 % SOLN
Status: AC
  Filled 2016-09-08: qty 480

## 2016-09-08 MED ORDER — SUCCINYLCHOLINE CHLORIDE 20 MG/ML IJ SOLN
INTRAMUSCULAR | Status: AC
  Filled 2016-09-08: qty 1

## 2016-09-08 MED ORDER — PROPOFOL 10 MG/ML IV BOLUS
INTRAVENOUS | Status: AC
Start: 2016-09-08 — End: 2016-09-08
  Filled 2016-09-08: qty 20

## 2016-09-08 MED ORDER — FENTANYL CITRATE (PF) 100 MCG/2ML IJ SOLN
INTRAMUSCULAR | Status: AC
  Filled 2016-09-08: qty 2

## 2016-09-08 MED ORDER — LIDOCAINE-EPINEPHRINE 2 %-1:100000 IJ SOLN
INTRAMUSCULAR | Status: DC | PRN
  Administered 2016-09-08: 1.7 mL via INTRADERMAL

## 2016-09-08 MED ORDER — LACTATED RINGERS IV SOLN: INTRAVENOUS | Status: DC

## 2016-09-08 MED ORDER — KETOROLAC TROMETHAMINE 30 MG/ML IJ SOLN
INTRAMUSCULAR | Status: AC
  Filled 2016-09-08: qty 1

## 2016-09-08 MED ORDER — BUPIVACAINE-EPINEPHRINE (PF) 0.5% -1:200000 IJ SOLN
INTRAMUSCULAR | Status: AC
  Filled 2016-09-08: qty 3.6

## 2016-09-08 MED ORDER — LIDOCAINE-EPINEPHRINE 2 %-1:100000 IJ SOLN
INTRAMUSCULAR | Status: AC
  Filled 2016-09-08: qty 10.2

## 2016-09-08 MED ORDER — MIDAZOLAM HCL 5 MG/5ML IJ SOLN
INTRAMUSCULAR | Status: DC | PRN
  Administered 2016-09-08: 2 mg via INTRAVENOUS

## 2016-09-08 MED ORDER — CEFAZOLIN SODIUM-DEXTROSE 2-4 GM/100ML-% IV SOLN
INTRAVENOUS | Status: AC
  Filled 2016-09-08: qty 100

## 2016-09-08 MED ORDER — LACTATED RINGERS IV SOLN
INTRAVENOUS | Status: DC | PRN
  Administered 2016-09-08: 08:00:00 via INTRAVENOUS

## 2016-09-08 MED ORDER — PROMETHAZINE HCL 25 MG/ML IJ SOLN: 6.2500 mg | INTRAMUSCULAR | Status: DC | PRN

## 2016-09-08 MED ORDER — ONDANSETRON HCL 4 MG/2ML IJ SOLN
INTRAMUSCULAR | Status: DC | PRN
  Administered 2016-09-08: 4 mg via INTRAVENOUS

## 2016-09-08 MED ORDER — EPHEDRINE SULFATE-NACL 50-0.9 MG/10ML-% IV SOSY
PREFILLED_SYRINGE | INTRAVENOUS | Status: DC | PRN
  Administered 2016-09-08 (×2): 5 mg via INTRAVENOUS

## 2016-09-08 MED ORDER — FENTANYL CITRATE (PF) 100 MCG/2ML IJ SOLN
25.0000 ug | INTRAMUSCULAR | Status: DC | PRN
  Administered 2016-09-08 (×2): 25 ug via INTRAVENOUS
  Administered 2016-09-08: 50 ug via INTRAVENOUS

## 2016-09-08 MED ORDER — ONDANSETRON HCL 4 MG/2ML IJ SOLN
INTRAMUSCULAR | Status: AC
  Filled 2016-09-08: qty 2

## 2016-09-08 MED ORDER — FENTANYL CITRATE (PF) 100 MCG/2ML IJ SOLN
INTRAMUSCULAR | Status: DC | PRN
  Administered 2016-09-08: 25 ug via INTRAVENOUS
  Administered 2016-09-08: 50 ug via INTRAVENOUS
  Administered 2016-09-08: 25 ug via INTRAVENOUS

## 2016-09-08 MED ORDER — SUGAMMADEX SODIUM 200 MG/2ML IV SOLN
INTRAVENOUS | Status: DC | PRN
  Administered 2016-09-08: 130 mg via INTRAVENOUS

## 2016-09-08 MED ORDER — KETOROLAC TROMETHAMINE 30 MG/ML IJ SOLN
INTRAMUSCULAR | Status: DC | PRN
  Administered 2016-09-08: 30 mg via INTRAVENOUS

## 2016-09-08 MED ORDER — MIDAZOLAM HCL 2 MG/2ML IJ SOLN
INTRAMUSCULAR | Status: AC
  Filled 2016-09-08: qty 2

## 2016-09-08 MED ORDER — ROCURONIUM BROMIDE 10 MG/ML (PF) SYRINGE
PREFILLED_SYRINGE | INTRAVENOUS | Status: DC | PRN
  Administered 2016-09-08: 30 mg via INTRAVENOUS

## 2016-09-08 MED ORDER — FENTANYL CITRATE (PF) 100 MCG/2ML IJ SOLN
INTRAMUSCULAR | Status: AC
  Administered 2016-09-08: 25 ug via INTRAVENOUS
  Filled 2016-09-08: qty 2

## 2016-09-08 MED ORDER — CEFAZOLIN SODIUM-DEXTROSE 2-4 GM/100ML-% IV SOLN
2.0000 g | Freq: Once | INTRAVENOUS | Status: AC
  Administered 2016-09-08: 2 g via INTRAVENOUS

## 2016-09-08 MED ORDER — ROCURONIUM BROMIDE 50 MG/5ML IV SOSY
PREFILLED_SYRINGE | INTRAVENOUS | Status: AC
  Filled 2016-09-08: qty 5

## 2016-09-08 MED ORDER — SUGAMMADEX SODIUM 200 MG/2ML IV SOLN
INTRAVENOUS | Status: AC
  Filled 2016-09-08: qty 2

## 2016-09-08 MED ORDER — ISOPROPYL ALCOHOL 70 % SOLN
Status: DC | PRN
  Administered 2016-09-08: 1 via TOPICAL

## 2016-09-08 SURGICAL SUPPLY — 32 items
ATTRACTOMAT 16X20 MAGNETIC DRP (DRAPES) ×3 IMPLANT
BAG SPEC THK2 15X12 ZIP CLS (MISCELLANEOUS)
BAG ZIPLOCK 12X15 (MISCELLANEOUS) IMPLANT
BANDAGE EYE OVAL (MISCELLANEOUS) ×6 IMPLANT
BLADE SURG 15 STRL LF DISP TIS (BLADE) ×2 IMPLANT
BLADE SURG 15 STRL SS (BLADE) ×6
CANNULA VESSEL W/WING WO/VALVE (CANNULA) ×3 IMPLANT
GAUZE SPONGE 4X4 12PLY STRL (GAUZE/BANDAGES/DRESSINGS) ×1 IMPLANT
GAUZE SPONGE 4X4 16PLY XRAY LF (GAUZE/BANDAGES/DRESSINGS) ×3 IMPLANT
GLOVE BIOGEL PI IND STRL 6 (GLOVE) ×1 IMPLANT
GLOVE BIOGEL PI INDICATOR 6 (GLOVE) ×2
GLOVE SURG ORTHO 8.0 STRL STRW (GLOVE) ×3 IMPLANT
GLOVE SURG SS PI 6.0 STRL IVOR (GLOVE) ×3 IMPLANT
GOWN STRL REUS W/TWL 2XL LVL3 (GOWN DISPOSABLE) ×3 IMPLANT
GOWN STRL REUS W/TWL LRG LVL3 (GOWN DISPOSABLE) ×3 IMPLANT
KIT BASIN OR (CUSTOM PROCEDURE TRAY) ×3 IMPLANT
NDL DENTAL RB 25GX1.25 (NEEDLE) IMPLANT
NEEDLE DENTAL RB 25GX1.25 (NEEDLE) ×6 IMPLANT
NS IRRIG 1000ML POUR BTL (IV SOLUTION) ×3 IMPLANT
PACK EENT SPLIT (PACKS) ×3 IMPLANT
PACKING VAGINAL (PACKING) ×3 IMPLANT
SPONGE SURGIFOAM ABS GEL 100 (HEMOSTASIS) ×2 IMPLANT
SUCTION FRAZIER HANDLE 12FR (TUBING) ×2
SUCTION TUBE FRAZIER 12FR DISP (TUBING) IMPLANT
SUT CHROMIC 3 0 PS 2 (SUTURE) ×9 IMPLANT
SUT CHROMIC 4 0 P 3 18 (SUTURE) ×2 IMPLANT
SYR 50ML LL SCALE MARK (SYRINGE) ×3 IMPLANT
TOWEL OR 17X26 10 PK STRL BLUE (TOWEL DISPOSABLE) ×3 IMPLANT
TUBING CONNECTING 10 (TUBING) ×2 IMPLANT
TUBING CONNECTING 10' (TUBING) ×1
WATER STERILE IRR 1500ML POUR (IV SOLUTION) ×3 IMPLANT
YANKAUER SUCT BULB TIP NO VENT (SUCTIONS) ×3 IMPLANT

## 2016-09-08 NOTE — Anesthesia Procedure Notes (Signed)
Procedure Name: Intubation Date/Time: 09/08/2016 8:45 AM Performed by: Carleene Cooper A Pre-anesthesia Checklist: Patient identified, Timeout performed, Emergency Drugs available, Suction available and Patient being monitored Patient Re-evaluated:Patient Re-evaluated prior to inductionOxygen Delivery Method: Circle system utilized Preoxygenation: Pre-oxygenation with 100% oxygen Intubation Type: IV induction Ventilation: Mask ventilation without difficulty and Nasal airway inserted- appropriate to patient size Laryngoscope Size: Mac and 3 Grade View: Grade I Nasal Tubes: Magill forceps - small, utilized, Left, Nasal prep performed and Nasal Rae Tube size: 7.0 mm Number of attempts: 1 Placement Confirmation: ETT inserted through vocal cords under direct vision,  positive ETCO2 and breath sounds checked- equal and bilateral Secured at: 25 cm Tube secured with: Tape Dental Injury: Teeth and Oropharynx as per pre-operative assessment  Comments: Smooth IV induction. Lubricated #6 and then #7 nasal airways placed with ease in the left nare. Easy mas. #7 nasal rae placed with ease with aid of small Magills. ATNI. ETT secured by Dr. Enrique Sack.

## 2016-09-08 NOTE — Anesthesia Postprocedure Evaluation (Signed)
Anesthesia Post Note  Patient: [name]  Procedure(s) Performed: Procedure(s) (LRB): Extraction of tooth #'s 4,5,13,14,15,17,18,19,29 and 31 with alveoloplasty and gross debridement of remaining teeth (N/A)  Patient location during evaluation: PACU Anesthesia Type: General Level of consciousness: awake and alert Pain management: pain level controlled Vital Signs Assessment: post-procedure vital signs reviewed and stable Respiratory status: spontaneous breathing, nonlabored ventilation, respiratory function stable and patient connected to nasal cannula oxygen Cardiovascular status: blood pressure returned to baseline and stable Postop Assessment: no signs of nausea or vomiting Anesthetic complications: no    Last Vitals:  Vitals:   09/08/16 1130 09/08/16 1143  BP: 123/78 131/74  Pulse: 94 91  Resp: 12 14  Temp: 36.7 C 36.7 C    Last Pain:  Vitals:   09/08/16 1153  TempSrc:   PainSc: 2                  [name]

## 2016-09-08 NOTE — Anesthesia Preprocedure Evaluation (Addendum)
Anesthesia Evaluation  Patient identified by MRN, date of birth, ID band Patient awake    Reviewed: Allergy & Precautions, NPO status , Patient's Chart, lab work & pertinent test results  Airway Mallampati: II  TM Distance: >3 FB Neck ROM: Full    Dental no notable dental hx.    Pulmonary neg pulmonary ROS,    Pulmonary exam normal breath sounds clear to auscultation       Cardiovascular negative cardio ROS Normal cardiovascular exam Rhythm:Regular Rate:Normal     Neuro/Psych negative neurological ROS  negative psych ROS   GI/Hepatic negative GI ROS, Neg liver ROS,   Endo/Other  negative endocrine ROS  Renal/GU negative Renal ROS  negative genitourinary   Musculoskeletal negative musculoskeletal ROS (+)   Abdominal   Peds negative pediatric ROS (+)  Hematology negative hematology ROS (+)   Anesthesia Other Findings   Reproductive/Obstetrics negative OB ROS                             Anesthesia Physical Anesthesia Plan  ASA: II  Anesthesia Plan: General   Post-op Pain Management:    Induction: Intravenous  Airway Management Planned: Nasal ETT  Additional Equipment:   Intra-op Plan:   Post-operative Plan: Extubation in OR  Informed Consent: I have reviewed the patients History and Physical, chart, labs and discussed the procedure including the risks, benefits and alternatives for the proposed anesthesia with the patient or authorized representative who has indicated his/her understanding and acceptance.   Dental advisory given  Plan Discussed with: CRNA and Surgeon  Anesthesia Plan Comments:        Anesthesia Quick Evaluation

## 2016-09-08 NOTE — Progress Notes (Signed)
PRE-OPERATIVE NOTE:  09/08/2016 [name] [id number]  VITALS: BP 124/84   Pulse 95   Temp 97.9 F (36.6 C) (Oral)   Resp 16   Ht 5\' 2"  (1.575 m)   Wt 143 lb 3.2 oz (65 kg)   SpO2 98%   BMI 26.19 kg/m   Lab Results  Component Value Date   WBC 7.4 08/27/2016   HGB 13.6 08/27/2016   HCT 40.7 08/27/2016   MCV 95.4 08/27/2016   PLT 315 08/27/2016   BMET    Component Value Date/Time   NA 142 08/27/2016 0758   K 4.3 08/27/2016 0758   CL 103 08/06/2016 0447   CO2 26 08/27/2016 0758   GLUCOSE 111 08/27/2016 0758   BUN 10.9 08/27/2016 0758   CREATININE 0.9 08/27/2016 0758   CALCIUM 10.0 08/27/2016 0758   GFRNONAA >60 08/06/2016 0447   GFRAA >60 08/06/2016 0447    No results found for: INR, PROTIME No results found for: PTT   [name] presents for multiple dental extractions with alveoloplasty and gross debridement of the remaining dentition in the operating room with general anesthesia.  SUBJECTIVE: The patient denies any acute medical or dental changes and agrees to proceed with treatment as planned.  EXAM: No sign of acute dental changes.  ASSESSMENT: Patient is affected by history of acute pulpitis, chronic apical periodontitis, dental caries, retained root segments, chronic periodontitis, and accretions.  PLAN: Patient agrees to proceed with treatment as planned in the operating room as previously discussed and accepts the risks, benefits, and complications of the proposed treatment. Patient is aware of the risk for bleeding, bruising, swelling, infection, pain, nerve damage, soft tissue damage, damage to adjacent teeth, sinus involvement, root tip fracture, mandible fracture, and the risks of complications associated with the anesthesia. Patient also is aware of the potential for other complications not mentioned above.   Lenn Cal, DDS

## 2016-09-08 NOTE — Discharge Instructions (Signed)
[name] is to use ibuprofen 200mg  post operatively for dental pain. Patient is to take three tablets by mouth every 6 hours for 24 hours and then as needed for dental pain after the first 24 hours. Patient to use her previously prescribed oxycodone with acetaminophen for breakthrough pain as per previous directions. Dr. Enrique Sack  MOUTH CARE AFTER SURGERY  FACTS:  Ice used in ice bag helps keep the swelling down, and can help lessen the pain.  It is easier to treat pain BEFORE it happens.  Spitting disturbs the clot and may cause bleeding to start again, or to get worse.  Smoking delays healing and can cause complications.  Sharing prescriptions can be dangerous.  Do not take medications not recently prescribed for you.  Antibiotics may stop birth control pills from working.  Use other means of birth control while on antibiotics.  Warm salt water rinses after the first 24 hours will help lessen the swelling:  Use 1/2 teaspoonful of table salt per oz.of water.  DO NOT:  Do not spit.  Do not drink through a straw.  Strongly advised not to smoke, dip snuff or chew tobacco at least for 3 days.  Do not eat sharp or crunchy foods.  Avoid the area of surgery when chewing.  Do not stop your antibiotics before your instructions say to do so.  Do not eat hot foods until bleeding has stopped.  If you need to, let your food cool down to room temperature.  EXPECT:  Some swelling, especially first 2-3 days.  Soreness or discomfort in varying degrees.  Follow your dentist's instructions about how to handle pain before it starts.  Pinkish saliva or light blood in saliva, or on your pillow in the morning.  This can last around 24 hours.  Bruising inside or outside the mouth.  This may not show up until 2-3 days after surgery.  Don't worry, it will go away in time.  Pieces of "bone" may work themselves loose.  It's OK.  If they bother you, let us know.  WHAT TO DO IMMEDIATELY AFTER  SURGERY:  Bite on the gauze with steady pressure for 1-2 hours.  Don't chew on the gauze.  Do not lie down flat.  Raise your head support especially for the first 24 hours.  Apply ice to your face on the side of the surgery.  You may apply it 20 minutes on and a few minutes off.  Ice for 8-12 hours.  You may use ice up to 24 hours.  Before the numbness wears off, take a pain pill as instructed.  Prescription pain medication is not always required.  SWELLING:  Expect swelling for the first couple of days.  It should get better after that.  If swelling increases 3 days or so after surgery; let us know as soon as possible.  FEVER:  Take Tylenol every 4 hours if needed to lower your temperature, especially if it is at 100F or higher.  Drink lots of fluids.  If the fever does not go away, let us know.  BREATHING TROUBLE:  Any unusual difficulty breathing means you have to have someone bring you to the emergency room ASAP  BLEEDING:  Light oozing is expected for 24 hours or so.  Prop head up with pillows  Avoid spitting  Do not confuse bright red fresh flowing blood with lots of saliva colored with a little bit of blood.  If you notice some bleeding, place gauze or a  tea bag where it is bleeding and apply CONSTANT pressure by biting down for 1 hour.  Avoid talking during this time.  Do not remove the gauze or tea bag during this hour to "check" the bleeding.  If you notice bright RED bleeding FLOWING out of particular area, and filling the floor of your mouth, put a wad of gauze on that area, bite down firmly and constantly.  Call us immediately.  If we're closed, have someone bring you to the emergency room.  ORAL HYGIENE:  Brush your teeth as usual after meals and before bedtime.  Use a soft toothbrush around the area of surgery.  DO NOT AVOID BRUSHING.  Otherwise bacteria(germs) will grow and may delay healing or encourage infection.  Since you cannot spit, just  gently rinse and let the water flow out of your mouth.  DO NOT SWISH HARD.  EATING:  Cool liquids are a good point to start.  Increase to soft foods as tolerated.  PRESCRIPTIONS:  Follow the directions for your prescriptions exactly as written.  If Dr. Enrique Sack gave you a narcotic pain medication, do not drive, operate machinery or drink alcohol when on that medication.  QUESTIONS:  Call our office during office hours [phone] or call the Emergency Room at [phone].

## 2016-09-08 NOTE — Transfer of Care (Signed)
Immediate Anesthesia Transfer of Care Note  Patient: [name]  Procedure(s) Performed: Procedure(s): Extraction of tooth #'s 4,5,13,14,15,17,18,19,29 and 31 with alveoloplasty and gross debridement of remaining teeth (N/A)  Patient Location: PACU  Anesthesia Type:General  Level of Consciousness: awake, alert  and oriented  Airway & Oxygen Therapy: Patient Spontanous Breathing and Patient connected to face mask oxygen  Post-op Assessment: Report given to RN and Post -op Vital signs reviewed and stable  Post vital signs: Reviewed and stable  Last Vitals:  Vitals:   09/08/16 0708  BP: 124/84  Pulse: 95  Resp: 16  Temp: 36.6 C    Last Pain:  Vitals:   09/08/16 0708  TempSrc: Oral  PainSc:       Patients Stated Pain Goal: 3 ([id number])  Complications: No apparent anesthesia complications

## 2016-09-08 NOTE — H&P (Signed)
09/08/2016  Patient:            [name] Date of Birth:  1951/04/24 MRN:                [id number]   BP 124/84   Pulse 95   Temp 97.9 F (36.6 C) (Oral)   Resp 16   Ht 5\' 2"  (1.575 m)   Wt 143 lb 3.2 oz (65 kg)   SpO2 98%   BMI 26.19 kg/m    DYNA FIGUEREO presents for multiple dental extractions with alveoloplasty and gross debridement of the remaining dentition in the operating or general anesthesia. Patient denies any acute medical or dental changes. Please see note from Dr. Elray Buba dated 08/27/2016 to act as H&P for dental operating procedure.  10/27/2016, DDS  MEDICAL ONCOLOGY [facility] NEW PATIENT EVALUATION   Name: [name]        Date: August 27, 2016  MRN: August [id number], 2016        DOB: [birth year]  REFERRING PHYSICIAN: E.Rossi cc 09/19/1951, MD (PCP), Merri Brunette,  Martina Sinner with [facility], Jordan Likes) New patient consultation Dr Trey Sailors 09-06-16   REASON FOR REFERRAL: IB2 endocervical adenocarcinoma   HISTORY OF PRESENT ILLNESS:[name] is a 65 y.o. female who is seen in consultation, alone for visit, at the request of Dr 68 for consideration of sensitizing chemotherapy with radiation for recently diagnosed IB2 adenocarcinoma of endocervix.   Patient had intermittent vaginal spotting beginning ~ 04-2016. She had sonohystogram by Dr 06-09-2002 06-01-16 with 2.4 cm endometrial polyp identified. Dr 08-26-1983 resected the polyp 07-06-16, with path [id number] endometrioid adenocarcinoma FIGO grade 2. She saw Dr [id number] in consultation, then had robotic assisted TAH BSO sentinel node on 08-05-16. Surgical pathology [id number] invasive adenocarcinoma of endocervix with isolated tumor cells in right external iliac node and micromet in left external iliac node. Review of path confirms endocervical primary without synchronous endometrial carcinoma. Recommendation at present is radiation with sensitizing chemotherapy.  She saw Dr [id number] on 08-23-16, recovering from surgery without complications. She attended chemotherapy education class prior to this visit. PET is scheduled for 08-31-16. Patient has consultation with Dr 10-31-16 planned 09-06-16.    REVIEW OF SYSTEMS  Stable weight. No other bleeding. No abdominal or pelvic pain. Some increase in chronic constipation since surgery, will increase bowel program. No bladder symptoms. No fever or other symptoms of infection. 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 Followed monthly by pain management, medications stable past 6 months. Occasional HA unchanged. Good visual acuity with glasses. No hearing problems. No dentist, several broken teeth which are painful. No thyroid problems. No respiratory problems. No cardiac symptoms. Physically deconditioned and does not tolerate much activity due to chronic back problems, tho prior to the accident she was in good physical condition. Due mammograms. Appetite not as good since surgery, no N/V. No neuropathy in hands. Superficial blood clot right calf post op, not on home lovenox post op.   Remainder of full 10 point review of systems negative.   ALLERGIES: Review of patient's allergies indicates no known allergies.  PAST MEDICAL/ SURGICAL HISTORY:    G3P3 Chronic pain due to L2-3-4 horseback riding injury 2007, including burst fracture L2, surgery with lateral fusion by Dr 2008 07-04-2006. Followed by 07-06-2006 Pain Management, Dr Jordan Likes  Osteoporosis Mammograms last at Dr [facility] office ~  2 years ago Colonoscopy never Remote breast abscess  CURRENT MEDICATIONS: reviewed as listed now in EMR  PHARMACY   [facility]   SOCIAL HISTORY:  From [place], lives [place]. Divorced. Daughter and 3 grandchildren live with her (grands 21,19,8) and son in [place] with 2 girls ages 69 and 49. No tobacco or ETOH.  No transfusions. Worked in [employer]. Not able to keep horses after accident.  FAMILY HISTORY:  Son died of osteosarcoma 10 years ago, at age 96. Care [facility] at [facility], then at [facility] with chemotherapy and 32 hour surgery, "cancer free for 3 days". Son had PAC. Mother died acute leukemia age 14 Sister with leukemia, living Maternal grandmother colon ca Maternal great aunt colon ca 3 sisters, 2 brothers: Sister DM, cardiac disease.Sister cardiac disease Brother died MI Son and daughter healthy.  Grandchildren healthy          PHYSICAL EXAM:  height is '5\' 2"'$  (1.575 m) and weight is 145 lb 4.8 oz (65.9 kg). Her oral temperature is 97.5 F (36.4 C). Her blood pressure is 131/82 and her pulse is 70. Her respiration is 18 and oxygen saturation is 98%.  Alert, pleasant, cooperative lady, looks stated age, walks slowly with cane. Good historian. Not in any acute distress.  HEENT:normal hair pattern. PERRL, not icteric. Oral mucosa moist and clear, posterior pharynx also. Amalgam fillings, broken teeth including lower molars on left. Neck supple, no JVD or thyroid mass.   RESPIRATORY: lungs clear to A and P  CARDIAC/ VASCULAR: heart RRR, no gallop. Peripheral pulses symmetrical and intact  ABDOMEN: soft not tender, not distended. Incisions from laparoscopic surgery closed and appear to be healing well. No HSM or mass. Bowel sounds occasional. No apparent fluid  LYMPH NODES: no cervical, supraclavicular, axillary or inguinal adenopathy  BREASTS: bilaterally without dominant mass, skin or nipple findings of concern  NEUROLOGIC: CN, motor, cerebellar grossly intact. Decreased sensation feet bilaterally.   PSYCH appropriate mood and affect  SKIN: without rash, ecchymosis, petechiae  MUSCULOSKELETAL: well healed scar from lumbar surgery. No swelling LE. Muscle mass symmetrical.     LABORATORY DATA:  Lab Results Last 48 Hours       Results  for orders placed or performed in visit on 08/27/16 (from the past 48 hour(s))  CBC with Differential     Status: None   Collection Time: 08/27/16  7:58 AM  Result Value Ref Range   WBC 7.4 3.9 - 10.3 10e3/uL   NEUT# 4.7 1.5 - 6.5 10e3/uL   HGB 13.6 11.6 - 15.9 g/dL   HCT 40.7 34.8 - 46.6 %   Platelets 315 145 - 400 10e3/uL   MCV 95.4 79.5 - 101.0 fL   MCH 31.8 25.1 - 34.0 pg   MCHC 33.4 31.5 - 36.0 g/dL   RBC 4.26 3.70 - 5.45 10e6/uL   RDW 12.3 11.2 - 14.5 %   lymph# 1.9 0.9 - 3.3 10e3/uL   MONO# 0.5 0.1 - 0.9 10e3/uL   Eosinophils Absolute 0.2 0.0 - 0.5 10e3/uL   Basophils Absolute 0.1 0.0 - 0.1 10e3/uL   NEUT% 63.9 38.4 - 76.8 %   LYMPH% 25.5 14.0 - 49.7 %   MONO% 7.2 0.0 - 14.0 %   EOS% 2.5 0.0 - 7.0 %   BASO% 0.9 0.0 - 2.0 %       CMET available after visit normal with exception of AG and EGFR, including creat 0.9, electrolytes, T bili 0.32 + other LFTs  PATHOLOGY: [name], [name] Collected: 07/06/2016 Client: [facility] Accession: [id number] Received: 07/06/2016 Richarda Overlie, MDREPORT OF SURGICAL PATHOLOGYNAL DIAGNOSIS Diagnosis Endometrium, curettage - ENDOMETRIOID ADENOCARCINOMA, SEE COMMENT. Microscopic Comment The carcinoma appears FIGO grade 2.    FINAL for [name], [name] ([id number]) Patient: [name], [name] Collected: 08/05/2016 Client: [facility] Accession: [id number] Received: 08/05/2016 Adolphus Birchwood, MDREPORT OF SURGICAL PATHOLOGYNAL DIAGNOSIS Diagnosis 1. Lymph node, sentinel, biopsy, right external iliac - ISOLATED TUMOR CELLS. - SEE MICROSCOPIC DESCRIPTION. 2. Lymph node, sentinel, biopsy, left external iliac - MICROMETASTATIC CARCINOMA IN ONE LYMPH NODE (1/1). 3. Uterus, ovaries and fallopian tubes, cervix - INVASIVE ADENOCARCINOMA INVOLVING ENDOCERVIX AND ENDOCERVICAL CANAL. - SEE ONCOLOGY TABLE AND COMMENT. Microscopic Comment 3. UTERINE CERVIX Specimen: Uterus with bilateral fallopian tubes and  ovaries and right and left external iliac sentinel lymph nodes. Procedure: Hysterectomy with bilateral salpingo-oophorectomy and sentinel lymph node biopsies. Tumor site (quadrant if known): Endocervix and endocervical canal Maximum tumor size (cm): 4 cm. Histologic type: Adenocarcinoma, see comment. Grade: Intermediate. Margins: Free of tumor. Distance of carcinoma from closest margin: 0.2 cm from parametrial soft tissue margin. Depth of invasion: 10 mm in depth where cervix is 15 mm in thickness. Horizontal extent (mm): 40 mm (4 cm) Lymph-Vascular invasion: Present. Vaginal extension: N/A Uterine corpus extension: Extends into high endocervical canal and focally lower uterine segment. Parametrial involvement: No. Lymph nodes: number examined - 2 ; number positive- 2 TNM code: pT1b2, pN1, pMX FIGO Stage (based on pathologic findings, need clinical correlation): IIIB Comment: The tumor is 4 cm in greatest dimension and involves the endocervix and endocervical canal and invades the cervical stroma to a depth of 1 cm where the cervix is measured as 1.5 cm in thickness. The tumor has villoglandular and endometrioid features. Immunohistochemistry shows diffuse strong positivity with p16 and patchy positivity with carcinoembryonic antigen while the tumor is negative with p53, estrogen receptor, progesterone receptor, and vimentin. The immunophenotype is more typical of an endocervical adenocarcinoma. In addition the entire endomyometrium is examined and no involvement by carcinoma of the endometrium is identified. There are a few microscopic foci with features suggesting precursor adenocarcinoma in situ. Immunohistochemistry for cytokeratin AE1/AE3 is performed and the right external iliac node shows positivity consistent with isolated tumor cells and the left external iliac node shows positive consistent with microscopic foci of metastatic carcinoma. No involvement of the endometrium, uterine  serosa, bilateral ovaries and bilateral fallopian tubes is identified.     RADIOGRAPHY: CHEST 2 VIEW 08-03-16 COMPARISON: 05/22/2014  FINDINGS: PA and lateral views chest demonstrate linear and slightly nodular appearing opacity in the peripheral left lung base on the PA view. No acute consolidation or effusion. Cardiomediastinal silhouette within normal limits. No pneumothorax. Lumbar spine surgical hardware is similar compared to previous.  IMPRESSION: 1. No acute consolidation or infiltrate 2. Linear but slightly nodular opacity within the left lung base best seen on PA view, favored to represent atelectasis or possible scarring, however given slightly nodular appearance, short interval radiographic follow-up is suggested.    PET pending 08-31-16    DISCUSSION: All of history above reviewed, including circumstances surrounding diagnosis and the pathology information. She is aware of PET upcoming, understands that gyn oncology will be following this information also and that plan for radiation and sensitizing chemotherapy will be confirmed when this information is available.  We have discussed rationale for radiation and sensitizing chemotherapy, including outpatient administration of chemotherapy, antiemetics, requirement for good oral and IV hydration with CDDP. She is  familiar with PAC from her son's chemotherapy, but appears to have peripheral venous access likely very adequate for CDDP, and expect only ~ 5-6 treatments of that regimen. After this discussion, she is in agreement with beginning treatment with peripheral access.  I have encouraged her to have someone drive her to and from chemo appointments, sister probably will be able to do this. Discussed chronic constipation related to chronic narcotics, worse now post surgery and may have more constipation also with chemo (vs diarrhea with radiation). She will increase bowel program.   Verbal consent given  for sensitizing CDDP.   IMPRESSION / PLAN:  1. IB2 endocervical adenocarcinoma: Path reviewed, conclusion that there is not a synchronous endometrial carcinoma. PET upcoming and consultation with Dr Sondra Come after PET. As long as no unexpected findings, likely sensitizing CDDP with radiation.  Chemotherapy and med onc MD appointments to be confirmed after radiation schedule is available. 2.chronic back pain related to lumbar spine injury 2007. On chronic pain meds by pain management clinic. 3.constipation related to pain medication and recent surgery: increase laxatives 4.flu vaccine 08-06-16 5.family history osteosarcoma in son, leukemia mother and sister, colon cancer . May want genetics counseling. 6.poor dentition with several broken teeth: referral made to [place]. Recommended biotene mouthwash regularly for now  7.question of nodular opacity left lung base by pre op CXR. PET pending. Note pneumonia 2015 was RLL per xray reports then. 8.overdue mammograms, last at Dr [facility] office ~ 2 years ago. Patient may get this done prior to start of this treatment 9.never colonoscopy 10.osteoporosis per patient, no DEXA in this EMR    Patient had questions answered to her satisfaction and is in agreement with plan above. She can contact this office for questions or concerns at any time prior to next scheduled visit. Chemotherapy orders placed (non pathway diagnosis for Via). Message to managed care. Message to collaborative RNs as appointments need to be confirmed/ coordinated with radiation. Cc Drs Shelia Media, Wilmon Arms, Kulinski  Time spent 60 min including >50% counseling and coordination of care.    Evlyn Clines, MD 08/27/2016 8:32 AM

## 2016-09-08 NOTE — Op Note (Signed)
OPERATIVE REPORT  Patient:            [name] Date of Birth:  1951/08/15 MRN:                [id number]   DATE OF PROCEDURE:  09/08/2016  PREOPERATIVE DIAGNOSES: 1. Cervical cancer 2. Pre-chemoradiation therapy dental protocol 3. History of acute pulpitis 4. Chronic apical periodontitis 5. Retained root segments 6. Dental caries 7. Chronic periodontitis 8. Accretions  POSTOPERATIVE DIAGNOSES: 1. Cervical cancer 2. Pre-chemoradiation therapy dental protocol 3. History of acute pulpitis 4. Chronic apical periodontitis 5. Retained root segments 6. Dental caries 7. Chronic periodontitis 8. Accretions  OPERATIONS: 1. Multiple extraction of tooth numbers 4, 5,13, 14, 15, 17,18,19, 29, and 31 2. 4 Quadrants of alveoloplasty 3. Gross debridement of remaining dentition   SURGEON: Lenn Cal, DDS  ASSISTANT: Camie Patience, (dental assistant)  ANESTHESIA: General anesthesia via nasoendotracheal tube.  MEDICATIONS: 1. Ancef 2 g IV prior to invasive dental procedures. 2. Local anesthesia with a total utilization of 5 carpules each containing 34 mg of lidocaine with 0.017 mg of epinephrine as well as 2 carpules each containing 9 mg of bupivacaine with 0.009 mg of epinephrine.  SPECIMENS: There are 10 teeth that were discarded.  DRAINS: None  CULTURES: None  COMPLICATIONS: None   ESTIMATED BLOOD LOSS: 50 mLs.  INTRAVENOUS FLUIDS: 1100 mLs of Lactated ringers solution.  INDICATIONS: The patient was recently diagnosed with cervical cancer.  A medically necessary dental consultation was then requested to evaluate poor dentition as part of a pre-chemoradiation therapy dental protocol.  The patient was examined and treatment planned for multiple extractions with alveoloplasty and gross debridement of the remaining dentition in the operating room with general anesthesia.  This treatment plan was formulated to decrease the risks and complications associated with  dental infection from affecting the patient's systemic health while undergoing chemoradiation therapy  OPERATIVE FINDINGS: Patient was examined operating room number 2.  The teeth were identified for extraction. The patient was noted be affected by chronic periodontitis, accretions, history of acute pulpitis, chronic apical periodontitis, multiple retained root segments, and extensive dental caries.  DESCRIPTION OF PROCEDURE: Patient was brought to the main operating room number 2. Patient was then placed in the supine position on the operating table. General anesthesia was then induced per the anesthesia team. The patient was then prepped and draped in the usual manner for dental medicine procedure. A timeout was performed. The patient was identified and procedures were verified. A throat pack was placed at this time. The oral cavity was then thoroughly examined with the findings noted above. The patient was then ready for dental medicine procedure as follows:  Local anesthesia was then administered sequentially with a total utilization of 5 carpules each containing 34 mg of lidocaine with 0.017 mg of epinephrine as well as 2 carpules  each containing 9 mg bupivacaine with 0.009 mg of epinephrine.  The Maxillary left and right quadrants first approached. Anesthesia was then delivered utilizing infiltration with lidocaine with epinephrine. A #15 blade incision was then made from the mesial of #2 and extended to the mesial of #6.  A  surgical flap was then carefully reflected. Appropriate amounts of buccal and interseptal bone were then removed utilizing a surgical handpiece and bur and copious amounts of sterile water around tooth numbers 4 and 5.  The teeth were then subluxated with a series of straight elevators. Tooth numbers 4 and 5 are then removed with a 150  forceps without complications.  Alveoloplasty was then performed utilizing a rongeur and bone file to help achieve primary closure. The surgical  site was irrigated with copious amounts sterile saline. A piece of Surgifoam was placed in each extraction socket appropriately. The surgical site was then closed from the mesial of #2 and extended to the distal of #6 utilizing 3-0 chromic gut suture in a continuous interrupted suture technique 1.  The maxillary left surgical site was then approached. A 15 blade incision was made from the maxillary left tuberosity and extended to the mesial of #12.  A surgical lab was then carefully reflected.Tooth numbers 13, 14, and 15 were then subluxated with a series of straight elevators.  Appropriate amounts of buccal and interseptal bone was then removed around tooth numbers 13 and 14.  Tooth #'a 13,14,and 15 were then subluxated and removed with a 150 forceps without complications. Alveoloplasty was then performed utilizing a ronguers and bone file to achieve primary closure. The surgical site was then irrigated with copious amounts of sterile saline. The tissues were approximated and trimmed appropriately. A piece of Surgifoam was placed in the extraction sockets appropriately. The surgical site was then closed from the maxillary left tuberosity and extended to the distal of #12 utilizing 3-0 chromic gut suture in a continuous interrupted suture technique 1.  At this point time, the mandibular quadrants were approached. The patient was given bilateral inferior alveolar nerve blocks and long buccal nerve blocks utilizing the bupivacaine with epinephrine. Further infiltration was then achieved utilizing the lidocaine with epinephrine. A 15 blade incision was then made from the mesial of #32 and extended to the mesial of #28. A surgical flap was then carefully reflected. Tooth numbers 29 and 31 were then subluxated with a series of straight elevators. Buccal and interseptal bone was then removed with a surgical handpiece and bur and copious postural saline around tooth numbers 29 and 31 appropriately. The teeth were  then further subluxated and tooth numbers 29 and 31 were then removed with a 151 forceps without complications. Alveoloplasty was then performed utilizing a rongeurs and bone file to help achieve primary closure. Surgical site was irrigated with copious amounts sterile saline. A piece of Surgifoam was placed extraction sockets appropriately. Surgical site was then closed from the mesial of #32 and extended to the distal of #28 utilizing 3-0 chromic gut suture in a continuous interrupted suture technique 1. 2 interrupted sutures are then placed to further closed surgical site utilizing 4-0 chromic gut material.   At this point time the mandibular left surgical site was approached. 15 blade incision was made from the distal of #17 and extended to the mesial of #21.   A surgical flap was then carefully reflected. The lower retained root segments #17, 18, and 20 were then subluxated with a series of straight elevators. A series of cryers elevators was then utilized to further remove retained root segments without complications. Alveoloplasty was then performed utilizing a rongeurs and bone file to help achieve primary closure. The tissues were approximated and trimmed appropriately. The surgical sites were then irrigated with copious amounts of sterile saline. A piece of Surgifoam was placed in each extraction socket appropriately. The mandibular left surgical site was then closed from the distal of #17 and extended to the distal of #19 utilizing 3-0 chromic gut suture in a continuous interrupted suture technique 1. The mandibular left surgical site was then further closed from the mesial of #19 and extended to the distal of #  21 utilizing 3-0 chromic gut suture in a continuous interrupted suture technique 1.  At this point time, the remaining dentition was approached. A sonic scaler was used to remove accretions. A series of hand curettes were then used to further remove accretions. A sonic scaler was then again  used to further refine removal of accretions. This completed the gross debridement procedure.  At this point time, the entire mouth was irrigated with copious amounts of sterile saline. The patient was examined for complications, seeing none, the dental medicine procedure was deemed to be complete. The throat pack was removed at this time. An oral airway was then placed at the request of the anesthesia team. A series of 4 x 4 gauze were placed in the mouth to aid hemostasis. The patient was then handed over to the anesthesia team for final disposition. After an appropriate amount of time, the patient was extubated and taken to the postanesthsia care unit in good condition. All counts were correct for the dental medicine procedure.   Lenn Cal, DDS.

## 2016-09-09 ENCOUNTER — Other Ambulatory Visit: Payer: Self-pay | Admitting: Oncology

## 2016-09-09 NOTE — Progress Notes (Signed)
Does patient have an allergy to IV contrast dye?: No.   Has patient ever received premedication for IV contrast dye?: No.   Does patient take metformin?: No.  If patient does take metformin when was the last dose: n/a  Date of lab work: August 27, 2016 BUN: 10.9 CR: 0.9  IV site: wrist right, condition patent and no redness  BP (!) 125/91 (BP Location: Right Arm, Patient Position: Sitting)   Pulse (!) 103   Temp 99 F (37.2 C) (Oral)   Ht 5\' 2"  (1.575 m)   Wt 143 lb 3.2 oz (65 kg)   SpO2 97%   BMI 26.19 kg/m

## 2016-09-10 ENCOUNTER — Encounter: Payer: Self-pay | Admitting: Oncology

## 2016-09-10 ENCOUNTER — Telehealth: Payer: Self-pay | Admitting: Oncology

## 2016-09-10 NOTE — Telephone Encounter (Signed)
-----   Message from Gordy Levan, MD sent at 09/10/2016 11:20 AM EDT ----- Regarding: RE: Chemo CDDP 10/30 FYI  looks like first chemo 11-6 is fine. I will send scheduling message to cancel request for 10-30 chemo  Lennis ----- Message ----- From: Jacqulyn Liner, RN Sent: 09/10/2016  11:03 AM To: Gordy Levan, MD Subject: RE: Chemo CDDP 10/30                           Dr. Marko Plume,  She has her [facility] on Tuesday, 10/24 so probably a start date seven days after which would put her start date on 11/2.   ----- Message ----- From: Gordy Levan, MD Sent: 09/10/2016  10:37 AM To: Adalberto Cole, RN, Tomasa Rand, RN, # Subject: RE: Chemo CDDP 10/30                           Needs weekly CDDP with radiation  No availability for CDDP 10-30 or 10-31 per Sharyn Lull.   I still do not know when RT starts, so do not know if we  have to request to Alexis/ Burman Nieves that she be worked in. As soon as we can get RT start date, that will help our planning.  Thank you! Lennis    ----- Message ----- From: Gean Maidens Sent: 09/09/2016   4:06 PM To: Gordy Levan, MD Subject: Melton Alar: Chemo CDDP 10/30                           See message below ----- Message ----- From: Thomos Lemons, NT Sent: 09/09/2016   3:59 PM To: Lovena Le Wheat, NT, Vonte M Hedgebeth Subject: RE: Chemo CDDP 10/30                           No available and 10/31 is capped...please advise ----- Message ----- From: Gean Maidens Sent: 09/09/2016  12:47 PM To: Lovena Le Wheat, NT, Vonte M Hedgebeth Subject: Chemo CDDP 10/30                               Per LL pt to start CDDP on 10/30 any space?

## 2016-09-10 NOTE — Telephone Encounter (Signed)
Do you want to also cancel request for [facility] on 10/27?

## 2016-09-10 NOTE — Progress Notes (Signed)
Medical Oncology  Per radiation oncology, anticipated start date is 09-23-16. Cancelled request for chemo 09-20-16; anticipate first chemo on 09-27-16  L.Marko Plume, MD

## 2016-09-14 ENCOUNTER — Ambulatory Visit
Admission: RE | Admit: 2016-09-14 | Discharge: 2016-09-14 | Disposition: A | Discharge status: Home / Self Care | Payer: Medicare Other | Source: Ambulatory Visit | Attending: Radiation Oncology | Admitting: Radiation Oncology

## 2016-09-14 VITALS — BP 125/91 | HR 103 | Temp 99.0°F | Ht 62.0 in | Wt 143.2 lb

## 2016-09-14 DIAGNOSIS — Z510 Encounter for antineoplastic radiation therapy: Secondary | ICD-10-CM | POA: Diagnosis not present

## 2016-09-14 DIAGNOSIS — Z90710 Acquired absence of both cervix and uterus: Secondary | ICD-10-CM | POA: Diagnosis not present

## 2016-09-14 DIAGNOSIS — C539 Malignant neoplasm of cervix uteri, unspecified: Secondary | ICD-10-CM

## 2016-09-14 DIAGNOSIS — M545 Low back pain: Secondary | ICD-10-CM | POA: Diagnosis not present

## 2016-09-14 DIAGNOSIS — C530 Malignant neoplasm of endocervix: Secondary | ICD-10-CM | POA: Diagnosis not present

## 2016-09-14 DIAGNOSIS — G8929 Other chronic pain: Secondary | ICD-10-CM | POA: Diagnosis not present

## 2016-09-14 DIAGNOSIS — Z90722 Acquired absence of ovaries, bilateral: Secondary | ICD-10-CM | POA: Diagnosis not present

## 2016-09-14 MED ORDER — SODIUM CHLORIDE 0.9 % IJ SOLN
10.0000 mL | Freq: Once | INTRAMUSCULAR | Status: AC
  Administered 2016-09-14: 10 mL via INTRAVENOUS

## 2016-09-15 DIAGNOSIS — G894 Chronic pain syndrome: Secondary | ICD-10-CM | POA: Diagnosis not present

## 2016-09-15 DIAGNOSIS — Z79891 Long term (current) use of opiate analgesic: Secondary | ICD-10-CM | POA: Diagnosis not present

## 2016-09-15 DIAGNOSIS — M961 Postlaminectomy syndrome, not elsewhere classified: Secondary | ICD-10-CM | POA: Diagnosis not present

## 2016-09-15 DIAGNOSIS — R109 Unspecified abdominal pain: Secondary | ICD-10-CM | POA: Diagnosis not present

## 2016-09-15 DIAGNOSIS — M47817 Spondylosis without myelopathy or radiculopathy, lumbosacral region: Secondary | ICD-10-CM | POA: Diagnosis not present

## 2016-09-15 DIAGNOSIS — Z79899 Other long term (current) drug therapy: Secondary | ICD-10-CM | POA: Diagnosis not present

## 2016-09-16 ENCOUNTER — Other Ambulatory Visit: Payer: Self-pay | Admitting: Oncology

## 2016-09-17 ENCOUNTER — Ambulatory Visit: Payer: Self-pay | Admitting: Oncology

## 2016-09-17 ENCOUNTER — Encounter: Payer: Self-pay | Admitting: Oncology

## 2016-09-17 ENCOUNTER — Other Ambulatory Visit: Payer: Self-pay

## 2016-09-17 NOTE — Progress Notes (Signed)
I have been unsuccessful meeting up with this pt so I emailed Ailene Ravel and Jenny Reichmann to see if one of them can meet with her in radiation to see if she would like to apply for the [facility] and [employer].

## 2016-09-17 NOTE — Progress Notes (Signed)
  Radiation Oncology         [phone] ________________________________  Name: [name] MRN: [id number]  Date: 09/14/2016  DOB: 08-27-1951  SIMULATION AND TREATMENT PLANNING NOTE    ICD-9-CM ICD-10-CM   1. Cervical cancer, FIGO stage IB2 (HCC) 180.9 C53.9     DIAGNOSIS:  Stage IIIB (pT1b2, pN1, pMX) endocervical adenocarcinoma  NARRATIVE:  The patient was brought to the [place].  Identity was confirmed.  All relevant records and images related to the planned course of therapy were reviewed.  The patient freely provided informed written consent to proceed with treatment after reviewing the details related to the planned course of therapy. The consent form was witnessed and verified by the simulation staff.  Then, the patient was set-up in a stable reproducible  supine position for radiation therapy.  CT images were obtained.  Surface markings were placed.  The CT images were loaded into the planning software.  Then the target and avoidance structures were contoured.  Treatment planning then occurred.  The radiation prescription was entered and confirmed.  Then, I designed and supervised the construction of a total of 2 medically necessary complex treatment devices.  I have requested : Intensity Modulated Radiotherapy (IMRT) is medically necessary for this case for the following reason:  Small bowel sparing..  I have ordered:dose calc.  PLAN:  The patient will receive 45 Gy in 25 fractions Followed by a boost to the vaginal cuff region of 18 gray using intracavitary brachytherapy treatments with iridium 192 as the high-dose-rate source.  -----------------------------------  Blair Promise, PhD, MD

## 2016-09-20 ENCOUNTER — Encounter (HOSPITAL_COMMUNITY): Payer: Self-pay | Admitting: Dentistry

## 2016-09-20 ENCOUNTER — Ambulatory Visit (HOSPITAL_COMMUNITY): Payer: Self-pay | Admitting: Dentistry

## 2016-09-20 VITALS — BP 120/74 | HR 77 | Temp 98.1°F

## 2016-09-20 DIAGNOSIS — K08409 Partial loss of teeth, unspecified cause, unspecified class: Secondary | ICD-10-CM

## 2016-09-20 DIAGNOSIS — K029 Dental caries, unspecified: Secondary | ICD-10-CM

## 2016-09-20 DIAGNOSIS — C539 Malignant neoplasm of cervix uteri, unspecified: Secondary | ICD-10-CM

## 2016-09-20 DIAGNOSIS — Z01818 Encounter for other preprocedural examination: Secondary | ICD-10-CM

## 2016-09-20 NOTE — Progress Notes (Signed)
POST OPERATIVE NOTE:  09/20/2016 [name] [id number]  VITALS: BP 120/74 (BP Location: Left Arm)   Pulse 77   Temp 98.1 F (36.7 C) (Oral)   LABS:  Lab Results  Component Value Date   WBC 7.4 08/27/2016   HGB 13.6 08/27/2016   HCT 40.7 08/27/2016   MCV 95.4 08/27/2016   PLT 315 08/27/2016   BMET    Component Value Date/Time   NA 142 08/27/2016 0758   K 4.3 08/27/2016 0758   CL 103 08/06/2016 0447   CO2 26 08/27/2016 0758   GLUCOSE 111 08/27/2016 0758   BUN 10.9 08/27/2016 0758   CREATININE 0.9 08/27/2016 0758   CALCIUM 10.0 08/27/2016 0758   GFRNONAA >60 08/06/2016 0447   GFRAA >60 08/06/2016 0447    No results found for: INR, PROTIME No results found for: PTT   [name] is status post extraction of multiple teeth with alveoloplasty and gross debridement of remaining teeth in the operating room on 09/08/2016. Patient now presents for evaluation of healing and suture removal. Patient scheduled to start chemoradiation therapy on 09/23/2016.   SUBJECTIVE: Patient with minimal complaints. A few stitches remain.  EXAM: There is no sign of infection, heme, or ooze. Sutures are loosely intact. Patient is healing in by generalized primary closure with several areas that will need to heal in by secondary intention.  PROCEDURE: The patient was given a chlorhexidine gluconate rinse for 30 seconds. Sutures were then removed without complication. Patient tolerated the procedure well.  ASSESSMENT: Post operative course is consistent with dental procedures performed in the operating room. Patient with multiple missing teeth. Patient still has multiple dental caries that will need to be restored in the future after chemotherapy with a dentist of her choice.  PLAN: 1. Continue salt water rinses as needed aid healing. 2. Brush teeth after meals and at bedtime. Floss at bedtime. 3. Advance diet as tolerated. 4. Follow-up with a dentist of her choice for  continued dental care and evaluation of other dental treatment needs after her chemoradiation therapy.   Lenn Cal, DDS

## 2016-09-20 NOTE — Patient Instructions (Signed)
PLAN: 1. Continue salt water rinses as needed aid healing. 2. Brush teeth after meals and at bedtime. Floss at bedtime. 3. Advance diet as tolerated. 4. Follow-up with a dentist of her choice for continued dental care and evaluation of other dental treatment needs after her chemoradiation therapy.   Lenn Cal, DDS

## 2016-09-22 DIAGNOSIS — Z510 Encounter for antineoplastic radiation therapy: Secondary | ICD-10-CM | POA: Diagnosis not present

## 2016-09-22 DIAGNOSIS — Z90722 Acquired absence of ovaries, bilateral: Secondary | ICD-10-CM | POA: Diagnosis not present

## 2016-09-22 DIAGNOSIS — G8929 Other chronic pain: Secondary | ICD-10-CM | POA: Diagnosis not present

## 2016-09-22 DIAGNOSIS — C530 Malignant neoplasm of endocervix: Secondary | ICD-10-CM | POA: Diagnosis not present

## 2016-09-22 DIAGNOSIS — Z90710 Acquired absence of both cervix and uterus: Secondary | ICD-10-CM | POA: Diagnosis not present

## 2016-09-22 DIAGNOSIS — M545 Low back pain: Secondary | ICD-10-CM | POA: Diagnosis not present

## 2016-09-23 ENCOUNTER — Ambulatory Visit
Admission: RE | Admit: 2016-09-23 | Discharge: 2016-09-23 | Disposition: A | Discharge status: Home / Self Care | Payer: Medicare Other | Source: Ambulatory Visit | Attending: Radiation Oncology | Admitting: Radiation Oncology

## 2016-09-23 DIAGNOSIS — Z90710 Acquired absence of both cervix and uterus: Secondary | ICD-10-CM | POA: Diagnosis not present

## 2016-09-23 DIAGNOSIS — C539 Malignant neoplasm of cervix uteri, unspecified: Secondary | ICD-10-CM

## 2016-09-23 DIAGNOSIS — G8929 Other chronic pain: Secondary | ICD-10-CM | POA: Diagnosis not present

## 2016-09-23 DIAGNOSIS — Z90722 Acquired absence of ovaries, bilateral: Secondary | ICD-10-CM | POA: Diagnosis not present

## 2016-09-23 DIAGNOSIS — M545 Low back pain: Secondary | ICD-10-CM | POA: Diagnosis not present

## 2016-09-23 DIAGNOSIS — C530 Malignant neoplasm of endocervix: Secondary | ICD-10-CM | POA: Diagnosis not present

## 2016-09-23 DIAGNOSIS — Z510 Encounter for antineoplastic radiation therapy: Secondary | ICD-10-CM | POA: Diagnosis not present

## 2016-09-23 NOTE — Progress Notes (Signed)
  Radiation Oncology         ([phone]) [phone] ________________________________  Name: [name] MRN: [id number]  Date: 09/23/2016  DOB: May 13, 1951  Simulation Verification Note    ICD-9-CM ICD-10-CM   1. Cervical cancer, FIGO stage IB2 (HCC) 180.9 C53.9     Status: outpatient  NARRATIVE: The patient was brought to the treatment unit and placed in the planned treatment position. The clinical setup was verified. Then port films were obtained and uploaded to the radiation oncology medical record software.  The treatment beams were carefully compared against the planned radiation fields. The position location and shape of the radiation fields was reviewed. They targeted volume of tissue appears to be appropriately covered by the radiation beams. Organs at risk appear to be excluded as planned.  Based on my personal review, I approved the simulation verification. The patient's treatment will proceed as planned.  -----------------------------------  Blair Promise, PhD, MD

## 2016-09-24 ENCOUNTER — Encounter: Payer: Self-pay | Admitting: Radiation Oncology

## 2016-09-24 ENCOUNTER — Ambulatory Visit
Admission: RE | Admit: 2016-09-24 | Discharge: 2016-09-24 | Disposition: A | Discharge status: Home / Self Care | Payer: Medicare Other | Source: Ambulatory Visit | Attending: Radiation Oncology | Admitting: Radiation Oncology

## 2016-09-24 ENCOUNTER — Inpatient Hospital Stay
Admission: RE | Admit: 2016-09-24 | Discharge: 2016-09-24 | Disposition: A | Discharge status: Home / Self Care | Payer: Medicare Other | Source: Ambulatory Visit | Attending: Radiation Oncology | Admitting: Radiation Oncology

## 2016-09-24 DIAGNOSIS — M545 Low back pain: Secondary | ICD-10-CM | POA: Diagnosis not present

## 2016-09-24 DIAGNOSIS — Z90722 Acquired absence of ovaries, bilateral: Secondary | ICD-10-CM | POA: Diagnosis not present

## 2016-09-24 DIAGNOSIS — C530 Malignant neoplasm of endocervix: Secondary | ICD-10-CM | POA: Diagnosis not present

## 2016-09-24 DIAGNOSIS — Z510 Encounter for antineoplastic radiation therapy: Secondary | ICD-10-CM | POA: Diagnosis not present

## 2016-09-24 DIAGNOSIS — G8929 Other chronic pain: Secondary | ICD-10-CM | POA: Diagnosis not present

## 2016-09-24 DIAGNOSIS — Z90710 Acquired absence of both cervix and uterus: Secondary | ICD-10-CM | POA: Diagnosis not present

## 2016-09-24 DIAGNOSIS — C539 Malignant neoplasm of cervix uteri, unspecified: Secondary | ICD-10-CM

## 2016-09-24 NOTE — Progress Notes (Signed)
Patient was reminded to drink five 8 oz glasses of water Sunday night and then another five glasses of water before chemotherapy on Monday.  Patient verbalized understanding and agreement and also said that she has picked up lorazepam and Zofran to take for nausea as needed.

## 2016-09-25 ENCOUNTER — Other Ambulatory Visit: Payer: Self-pay | Admitting: Oncology

## 2016-09-27 ENCOUNTER — Ambulatory Visit
Admission: RE | Admit: 2016-09-27 | Discharge: 2016-09-27 | Disposition: A | Discharge status: Home / Self Care | Payer: Medicare Other | Source: Ambulatory Visit | Attending: Radiation Oncology | Admitting: Radiation Oncology

## 2016-09-27 ENCOUNTER — Encounter: Payer: Self-pay | Admitting: General Practice

## 2016-09-27 ENCOUNTER — Ambulatory Visit (HOSPITAL_BASED_OUTPATIENT_CLINIC_OR_DEPARTMENT_OTHER): Payer: Medicare Other

## 2016-09-27 ENCOUNTER — Other Ambulatory Visit (HOSPITAL_BASED_OUTPATIENT_CLINIC_OR_DEPARTMENT_OTHER): Payer: Medicare Other

## 2016-09-27 ENCOUNTER — Encounter: Payer: Self-pay | Admitting: Oncology

## 2016-09-27 ENCOUNTER — Ambulatory Visit (HOSPITAL_BASED_OUTPATIENT_CLINIC_OR_DEPARTMENT_OTHER): Payer: Medicare Other | Admitting: Oncology

## 2016-09-27 VITALS — BP 107/72 | HR 75 | Temp 98.2°F | Resp 18

## 2016-09-27 VITALS — BP 135/82 | HR 80 | Temp 97.8°F | Resp 18 | Ht 62.0 in | Wt 146.7 lb

## 2016-09-27 DIAGNOSIS — C539 Malignant neoplasm of cervix uteri, unspecified: Secondary | ICD-10-CM

## 2016-09-27 DIAGNOSIS — C530 Malignant neoplasm of endocervix: Secondary | ICD-10-CM

## 2016-09-27 DIAGNOSIS — Z5111 Encounter for antineoplastic chemotherapy: Secondary | ICD-10-CM

## 2016-09-27 DIAGNOSIS — G8921 Chronic pain due to trauma: Secondary | ICD-10-CM

## 2016-09-27 DIAGNOSIS — Z510 Encounter for antineoplastic radiation therapy: Secondary | ICD-10-CM | POA: Diagnosis not present

## 2016-09-27 DIAGNOSIS — M545 Low back pain: Secondary | ICD-10-CM

## 2016-09-27 DIAGNOSIS — Z90722 Acquired absence of ovaries, bilateral: Secondary | ICD-10-CM | POA: Diagnosis not present

## 2016-09-27 DIAGNOSIS — G8929 Other chronic pain: Secondary | ICD-10-CM

## 2016-09-27 DIAGNOSIS — K5903 Drug induced constipation: Secondary | ICD-10-CM

## 2016-09-27 DIAGNOSIS — R911 Solitary pulmonary nodule: Secondary | ICD-10-CM | POA: Diagnosis not present

## 2016-09-27 DIAGNOSIS — M810 Age-related osteoporosis without current pathological fracture: Secondary | ICD-10-CM

## 2016-09-27 DIAGNOSIS — Z90710 Acquired absence of both cervix and uterus: Secondary | ICD-10-CM | POA: Diagnosis not present

## 2016-09-27 LAB — CBC WITH DIFFERENTIAL/PLATELET
BASO%: 1 % (ref 0.0–2.0)
BASOS ABS: 0.1 10*3/uL (ref 0.0–0.1)
EOS ABS: 0.1 10*3/uL (ref 0.0–0.5)
EOS%: 2.6 % (ref 0.0–7.0)
HCT: 38.4 % (ref 34.8–46.6)
HEMOGLOBIN: 12.9 g/dL (ref 11.6–15.9)
LYMPH%: 37.9 % (ref 14.0–49.7)
MCH: 31.7 pg (ref 25.1–34.0)
MCHC: 33.7 g/dL (ref 31.5–36.0)
MCV: 94.2 fL (ref 79.5–101.0)
MONO#: 0.5 10*3/uL (ref 0.1–0.9)
MONO%: 8.5 % (ref 0.0–14.0)
NEUT%: 50 % (ref 38.4–76.8)
NEUTROS ABS: 2.8 10*3/uL (ref 1.5–6.5)
PLATELETS: 295 10*3/uL (ref 145–400)
RBC: 4.07 10*6/uL (ref 3.70–5.45)
RDW: 12.4 % (ref 11.2–14.5)
WBC: 5.6 10*3/uL (ref 3.9–10.3)
lymph#: 2.1 10*3/uL (ref 0.9–3.3)

## 2016-09-27 LAB — COMPREHENSIVE METABOLIC PANEL
ALBUMIN: 3.5 g/dL (ref 3.5–5.0)
ALK PHOS: 100 U/L (ref 40–150)
ALT: 10 U/L (ref 0–55)
ANION GAP: 10 meq/L (ref 3–11)
AST: 15 U/L (ref 5–34)
BILIRUBIN TOTAL: 0.33 mg/dL (ref 0.20–1.20)
BUN: 8.4 mg/dL (ref 7.0–26.0)
CALCIUM: 9.4 mg/dL (ref 8.4–10.4)
CO2: 24 mEq/L (ref 22–29)
Chloride: 101 mEq/L (ref 98–109)
Creatinine: 0.8 mg/dL (ref 0.6–1.1)
EGFR: 85 mL/min/{1.73_m2} — AB (ref >90)
Glucose: 93 mg/dl (ref 70–140)
Potassium: 3.9 mEq/L (ref 3.5–5.1)
Sodium: 135 mEq/L — ABNORMAL LOW (ref 136–145)
TOTAL PROTEIN: 6.9 g/dL (ref 6.4–8.3)

## 2016-09-27 LAB — MAGNESIUM: MAGNESIUM: 2 mg/dL (ref 1.5–2.5)

## 2016-09-27 MED ORDER — SODIUM CHLORIDE 0.9 % IV SOLN
40.0000 mg/m2 | Freq: Once | INTRAVENOUS | Status: AC
  Administered 2016-09-27: 68 mg via INTRAVENOUS
  Filled 2016-09-27: qty 68

## 2016-09-27 MED ORDER — POTASSIUM CHLORIDE 2 MEQ/ML IV SOLN
Freq: Once | INTRAVENOUS | Status: AC
  Administered 2016-09-27: 10:00:00 via INTRAVENOUS
  Filled 2016-09-27: qty 10

## 2016-09-27 MED ORDER — ONDANSETRON HCL 8 MG PO TABS
8.0000 mg | ORAL_TABLET | Freq: Once | ORAL | Status: AC
  Administered 2016-09-27: 8 mg via ORAL

## 2016-09-27 MED ORDER — LORAZEPAM 2 MG/ML IJ SOLN
0.5000 mg | Freq: Once | INTRAMUSCULAR | Status: AC | PRN
  Administered 2016-09-27: 0.5 mg via INTRAVENOUS

## 2016-09-27 MED ORDER — LORAZEPAM 2 MG/ML IJ SOLN
INTRAMUSCULAR | Status: AC
  Filled 2016-09-27: qty 1

## 2016-09-27 MED ORDER — SODIUM CHLORIDE 0.9 % IV SOLN
Freq: Once | INTRAVENOUS | Status: AC
  Administered 2016-09-27: 12:00:00 via INTRAVENOUS
  Filled 2016-09-27: qty 5

## 2016-09-27 MED ORDER — ONDANSETRON HCL 8 MG PO TABS
ORAL_TABLET | ORAL | Status: AC
  Filled 2016-09-27: qty 1

## 2016-09-27 MED ORDER — SODIUM CHLORIDE 0.9 % IV SOLN
Freq: Once | INTRAVENOUS | Status: AC
  Administered 2016-09-27: 12:00:00 via INTRAVENOUS

## 2016-09-27 NOTE — Progress Notes (Signed)
[place] Note  Introduced [place] at close of pt's tx today, per referral from [facility]/RN for emotional support due to high distress screen.  [name] was asleep during first attempt and drowsy upon second encounter.  We plan to f/u by phone or in person when she is on campus, but please also page if immediate needs arise.  Thank you.   [place], [place], [facility] Pager [phone] Voicemail [phone]

## 2016-09-27 NOTE — Progress Notes (Signed)
1430- Pt successfully tolerated first time Cisplatin treatment today. Placed follow up chemo phone call under "in basket" msg for tomorrow. Pt educated and reinforced on possible symptoms/side effects that she may experience after chemo and when to call for any concerns. Pt verbalized understanding and received AVS with future appts. Pt also encouraged to increase fluid intake after chemo and before coming in for chemo next week, to help hydration status and peripheral access.

## 2016-09-27 NOTE — Patient Instructions (Signed)
[facility] Discharge Instructions for Patients Receiving Chemotherapy  Today you received the following chemotherapy agents Cisplatin  To help prevent nausea and vomiting after your treatment, we encourage you to take your nausea medication as directed by our MD.If you develop nausea and vomiting that is not controlled by your nausea medication, call the clinic.   BELOW ARE SYMPTOMS THAT SHOULD BE REPORTED IMMEDIATELY:  *FEVER GREATER THAN 100.5 F  *CHILLS WITH OR WITHOUT FEVER  NAUSEA AND VOMITING THAT IS NOT CONTROLLED WITH YOUR NAUSEA MEDICATION  *UNUSUAL SHORTNESS OF BREATH  *UNUSUAL BRUISING OR BLEEDING  TENDERNESS IN MOUTH AND THROAT WITH OR WITHOUT PRESENCE OF ULCERS  *URINARY PROBLEMS  *BOWEL PROBLEMS  UNUSUAL RASH Items with * indicate a potential emergency and should be followed up as soon as possible.  Feel free to call the clinic you have any questions or concerns. The clinic phone number is [phone].  Please show the [facility] at check-in to the Emergency Department and triage nurse.  Cisplatin injection What is this medicine? CISPLATIN (SIS pla tin) is a chemotherapy drug. It targets fast dividing cells, like cancer cells, and causes these cells to die. This medicine is used to treat many types of cancer like bladder, ovarian, and testicular cancers. This medicine may be used for other purposes; ask your health care provider or pharmacist if you have questions. What should I tell my health care provider before I take this medicine? They need to know if you have any of these conditions: -blood disorders -hearing problems -kidney disease -recent or ongoing radiation therapy -an unusual or allergic reaction to cisplatin, carboplatin, other chemotherapy, other medicines, foods, dyes, or preservatives -pregnant or trying to get pregnant -breast-feeding How should I use this medicine? This drug is given as an infusion into a vein. It  is administered in a hospital or clinic by a specially trained health care professional. Talk to your pediatrician regarding the use of this medicine in children. Special care may be needed. Overdosage: If you think you have taken too much of this medicine contact a poison control center or emergency room at once. NOTE: This medicine is only for you. Do not share this medicine with others. What if I miss a dose? It is important not to miss a dose. Call your doctor or health care professional if you are unable to keep an appointment. What may interact with this medicine? -dofetilide -foscarnet -medicines for seizures -medicines to increase blood counts like filgrastim, pegfilgrastim, sargramostim -probenecid -pyridoxine used with altretamine -rituximab -some antibiotics like amikacin, gentamicin, neomycin, polymyxin B, streptomycin, tobramycin -sulfinpyrazone -vaccines -zalcitabine Talk to your doctor or health care professional before taking any of these medicines: -acetaminophen -aspirin -ibuprofen -ketoprofen -naproxen This list may not describe all possible interactions. Give your health care provider a list of all the medicines, herbs, non-prescription drugs, or dietary supplements you use. Also tell them if you smoke, drink alcohol, or use illegal drugs. Some items may interact with your medicine. What should I watch for while using this medicine? Your condition will be monitored carefully while you are receiving this medicine. You will need important blood work done while you are taking this medicine. This drug may make you feel generally unwell. This is not uncommon, as chemotherapy can affect healthy cells as well as cancer cells. Report any side effects. Continue your course of treatment even though you feel ill unless your doctor tells you to stop. In some cases, you may be given  additional medicines to help with side effects. Follow all directions for their use. Call your  doctor or health care professional for advice if you get a fever, chills or sore throat, or other symptoms of a cold or flu. Do not treat yourself. This drug decreases your body's ability to fight infections. Try to avoid being around people who are sick. This medicine may increase your risk to bruise or bleed. Call your doctor or health care professional if you notice any unusual bleeding. Be careful brushing and flossing your teeth or using a toothpick because you may get an infection or bleed more easily. If you have any dental work done, tell your dentist you are receiving this medicine. Avoid taking products that contain aspirin, acetaminophen, ibuprofen, naproxen, or ketoprofen unless instructed by your doctor. These medicines may hide a fever. Do not become pregnant while taking this medicine. Women should inform their doctor if they wish to become pregnant or think they might be pregnant. There is a potential for serious side effects to an unborn child. Talk to your health care professional or pharmacist for more information. Do not breast-feed an infant while taking this medicine. Drink fluids as directed while you are taking this medicine. This will help protect your kidneys. Call your doctor or health care professional if you get diarrhea. Do not treat yourself. What side effects may I notice from receiving this medicine? Side effects that you should report to your doctor or health care professional as soon as possible: -allergic reactions like skin rash, itching or hives, swelling of the face, lips, or tongue -signs of infection - fever or chills, cough, sore throat, pain or difficulty passing urine -signs of decreased platelets or bleeding - bruising, pinpoint red spots on the skin, black, tarry stools, nosebleeds -signs of decreased red blood cells - unusually weak or tired, fainting spells, lightheadedness -breathing problems -changes in hearing -gout pain -low blood counts - This drug  may decrease the number of white blood cells, red blood cells and platelets. You may be at increased risk for infections and bleeding. -nausea and vomiting -pain, swelling, redness or irritation at the injection site -pain, tingling, numbness in the hands or feet -problems with balance, movement -trouble passing urine or change in the amount of urine Side effects that usually do not require medical attention (report to your doctor or health care professional if they continue or are bothersome): -changes in vision -loss of appetite -metallic taste in the mouth or changes in taste This list may not describe all possible side effects. Call your doctor for medical advice about side effects. You may report side effects to FDA at 1-800-FDA-1088. Where should I keep my medicine? This drug is given in a hospital or clinic and will not be stored at home. NOTE: This sheet is a summary. It may not cover all possible information. If you have questions about this medicine, talk to your doctor, pharmacist, or health care provider.    2016, [employer]vier/[employer] Standard. (2008-02-13 14:40:54)

## 2016-09-27 NOTE — Progress Notes (Signed)
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 PET 08-31-16 did not show clear metastatic disease (20m RUL lung nodule which will need to be followed with next scans). Radiation scheduled thru 10-28-16.  Patient had 10 teeth extracted by Dr KEnrique Sackon 09-08-16, healing well, OK from dental standpoint to begin chemo.   Even with healing from the recent dental procedures, her mouth feels much better than prior to the extractions. She has begun eating some regular diet in last 24 hours, continues peridex mouthwash as instructed, all discomfort improving, no bleeding or increased swelling.  She did all of oral prehydration for chemo today as instructed.  She has had no bowel movement since diarrhea following miralax several days ago. Note chronic constipation from pain medication, obvious on PET CT, see below. No SOB, no fever, no bleeding, no LE swelling, no new or different abdominal or pelvic pain. Slight urinary discomfort just after radiation planning procedures, resolved.  Remainder of 10 point Review of Systems negative.   No central catheter Flu vaccine 08-06-16 No genetics counseling (Note family history osteosarcoma, leukemia, colon ca)  ONCOLOGIC HISTORY Patient had intermittent vaginal spotting beginning ~ 04-2016. She had sonohystogram by Dr HMatthew Saras7-11-17 with 2.4 cm endometrial polyp identified. Dr HMatthew Sarasresected the polyp 07-06-16, with path [id number] endometrioid adenocarcinoma FIGO grade 2. She saw Dr RDenman Georgein consultation, then had robotic assisted TAH BSO sentinel node on 08-05-16. Surgical pathology [id number] adenocarcinoma of endocervix with isolated  tumor cells in right external iliac node and micromet in left external iliac node. Review of path confirms endocervical primary without synchronous endometrial carcinoma. PET 08-31-16 no obvious metastatic disease, 8 mm RUL lung nodule will be followed up on next scans. Recommendation is radiation with sensitizing chemotherapy.    Objective:  Vital signs in last 24 hours:  BP 135/82 (BP Location: Right Arm, Patient Position: Sitting)   Pulse 80   Temp 97.8 F (36.6 C) (Oral)   Resp 18   Ht '5\' 2"'$  (1.575 m)   Wt 146 lb 11.2 oz (66.5 kg)   SpO2 100%   BMI 26.83 kg/m  Weight up 2 lb. Alert, oriented and appropriate. Ambulatory without difficulty.  No alopecia  HEENT:PERRL, sclerae not icteric. Areas of multiple dental extractions all clean, appear to be healing well, no unusual swelling. Oral mucosa moist without lesions, posterior pharynx clear.  Neck supple. No JVD.  Lymphatics:no supraclavicular adenopathy Resp: clear to auscultation bilaterally and normal percussion bilaterally Cardio: regular rate and rhythm. No gallop. GI: soft, nontender, not distended, no mass or organomegaly. Minimal bowel sounds.  Musculoskeletal/ Extremities: LE without pitting edema, cords, tenderness Neuro: no peripheral neuropathy in hands. 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  Skin without rash, ecchymosis, petechiae   Lab Results:  Results for orders placed or performed in visit on 09/27/16  CBC with Differential  Result Value Ref Range   WBC 5.6 3.9 - 10.3 10e3/uL   NEUT# 2.8 1.5 - 6.5 10e3/uL   HGB 12.9 11.6 - 15.9 g/dL   HCT 38.4 34.8 - 46.6 %   Platelets 295 145 - 400 10e3/uL   MCV 94.2 79.5 - 101.0 fL   MCH 31.7 25.1 -  34.0 pg   MCHC 33.7 31.5 - 36.0 g/dL   RBC 4.07 3.70 - 5.45 10e6/uL   RDW 12.4 11.2 - 14.5 %   lymph# 2.1 0.9 - 3.3 10e3/uL   MONO# 0.5 0.1 - 0.9 10e3/uL   Eosinophils Absolute 0.1 0.0 - 0.5 10e3/uL   Basophils Absolute 0.1 0.0 - 0.1 10e3/uL    NEUT% 50.0 38.4 - 76.8 %   LYMPH% 37.9 14.0 - 49.7 %   MONO% 8.5 0.0 - 14.0 %   EOS% 2.6 0.0 - 7.0 %   BASO% 1.0 0.0 - 2.0 %  Comprehensive metabolic panel  Result Value Ref Range   Sodium 135 (L) 136 - 145 mEq/L   Potassium 3.9 3.5 - 5.1 mEq/L   Chloride 101 98 - 109 mEq/L   CO2 24 22 - 29 mEq/L   Glucose 93 70 - 140 mg/dl   BUN 8.4 7.0 - 26.0 mg/dL   Creatinine 0.8 0.6 - 1.1 mg/dL   Total Bilirubin 0.33 0.20 - 1.20 mg/dL   Alkaline Phosphatase 100 40 - 150 U/L   AST 15 5 - 34 U/L   ALT 10 0 - 55 U/L   Total Protein 6.9 6.4 - 8.3 g/dL   Albumin 3.5 3.5 - 5.0 g/dL   Calcium 9.4 8.4 - 10.4 mg/dL   Anion Gap 10 3 - 11 mEq/L   EGFR 85 (L) >90 ml/min/1.73 m2  Magnesium  Result Value Ref Range   Magnesium 2.0 1.5 - 2.5 mg/dl     Studies/Results:  EXAM: NUCLEAR MEDICINE PET SKULL BASE TO THIGH  TECHNIQUE: Seven point to mCi F-18 FDG was injected intravenously. Full-ring PET imaging was performed from the skull base to thigh after the radiotracer. CT data was obtained and used for attenuation correction and anatomic localization.  FASTING BLOOD GLUCOSE:  Value: 106 mg/dl  COMPARISON:  Chest radiograph 08/04/2015.  FINDINGS: NECK  No cervical nodal hypermetabolism. Left mandibular hypermetabolism likely corresponds to a periapical lucency, including on image 27/ series 4.  CHEST  No thoracic nodal or pulmonary parenchymal hypermetabolism.  ABDOMEN/PELVIS  No abdominal pelvic nodal or parenchymal hypermetabolism identified.  SKELETON  No suspicious focal osseous activity. Activity about the greater trochanters is likely due to bursitis and worse on the right.  CT IMAGES PERFORMED FOR ATTENUATION CORRECTION  No cervical adenopathy. Right upper lobe ground-glass opacity measures 8 mm on image 24/series 8. Left base atelectasis or scar. L1-3 fusion with resultant mild beam hardening artifact. Colonic stool burden suggests constipation. No  abdominal pelvic adenopathy. Hysterectomy. No adnexal mass. No postoperative complication identified.  IMPRESSION: 1. No hypermetabolic metastatic disease identified. 2. 8 mm right upper lobe ground-glass opacity. Consider dedicated chest CT to serve as a baseline and allow surveillance. This could represent an area of post infectious/inflammatory scarring or an indolent neoplasm such as low-grade adenocarcinoma. 3.  Possible constipation. 4. Hypermetabolism in the left mandible, likely correlated with a mandibular periapical and lucency.  PACs images reviewed by MD   Medications: I have reviewed the patient's current medications. She is to take miralax daily if no BM in the 24 hrs prior.   DISCUSSION Interval history including PET findings and dental situation reviewed.  Patient aware that she may be more constipated than her significant baseline constipation related to the CDDP, tho she may get some radiation effects counteracting this as treatment proceeds. For now, miralax today and as above. She understands  CDDP hydration. She knows to call if concerns between visits.  Assessment/Plan: 1. IB2 endocervical adenocarcinoma: Path reviewed, conclusion that there is not a synchronous endometrial carcinoma. No obvious metastatic disease by PET 08-31-16. First sensitizing CDDP 09-27-16, planned weekly with radiation, weekly labs, MD appointments coordinated. 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 On chronic pain meds by pain management clinic. 3.constipation related to pain medication and recent surgery: increase laxatives, see above 4.flu vaccine 08-06-16 5.family history osteosarcoma in son, leukemia mother and sister, colon cancer . Consider genetics counseling. 6.post extractions of 10 teeth by Dr Enrique Sack 09-08-16, healing well. Still caries that need to be addressed by dentist of choice after chemo/ RT.  7. 8 mm RUL pulmonary nodule on PET/ CT, will  need to follow up with next scans. Question of nodular opacity left lung base by pre op CXR.  Note pneumonia 2015 was RLL per xray reports. 8.overdue mammograms, last at Dr [facility] office ~ 2 years ago.  9.never colonoscopy 10.osteoporosis per patient, no DEXA in this EMR   All questions answered. Chemo orders confirmed. CC Drs Denman George, Peggye Ley, [place], [place], [place] Time spent 25 min including >50% counseling and coordination of care.     Evlyn Clines, MD   09/28/2016, 10:26 AM

## 2016-09-28 ENCOUNTER — Telehealth: Payer: Self-pay

## 2016-09-28 ENCOUNTER — Ambulatory Visit
Admission: RE | Admit: 2016-09-28 | Discharge: 2016-09-28 | Disposition: A | Discharge status: Home / Self Care | Payer: Medicare Other | Source: Ambulatory Visit | Attending: Radiation Oncology | Admitting: Radiation Oncology

## 2016-09-28 ENCOUNTER — Encounter: Payer: Self-pay | Admitting: Radiation Oncology

## 2016-09-28 VITALS — BP 119/78 | HR 88 | Temp 98.2°F | Ht 62.0 in | Wt 149.0 lb

## 2016-09-28 DIAGNOSIS — G8929 Other chronic pain: Secondary | ICD-10-CM | POA: Diagnosis not present

## 2016-09-28 DIAGNOSIS — C539 Malignant neoplasm of cervix uteri, unspecified: Secondary | ICD-10-CM

## 2016-09-28 DIAGNOSIS — Z510 Encounter for antineoplastic radiation therapy: Secondary | ICD-10-CM | POA: Diagnosis not present

## 2016-09-28 DIAGNOSIS — M545 Low back pain: Secondary | ICD-10-CM | POA: Diagnosis not present

## 2016-09-28 DIAGNOSIS — Z90710 Acquired absence of both cervix and uterus: Secondary | ICD-10-CM | POA: Diagnosis not present

## 2016-09-28 DIAGNOSIS — M810 Age-related osteoporosis without current pathological fracture: Secondary | ICD-10-CM | POA: Insufficient documentation

## 2016-09-28 DIAGNOSIS — Z90722 Acquired absence of ovaries, bilateral: Secondary | ICD-10-CM | POA: Diagnosis not present

## 2016-09-28 DIAGNOSIS — C530 Malignant neoplasm of endocervix: Secondary | ICD-10-CM | POA: Diagnosis not present

## 2016-09-28 DIAGNOSIS — R911 Solitary pulmonary nodule: Secondary | ICD-10-CM | POA: Insufficient documentation

## 2016-09-28 NOTE — Progress Notes (Signed)
  Radiation Oncology         [phone] ________________________________  Name: [name] MRN: [id number]  Date: 09/28/2016  DOB: 1951-02-03  Weekly Radiation Therapy Management    ICD-9-CM ICD-10-CM   1. Cervical cancer, FIGO stage IB2 (HCC) 180.9 C53.9      Current Dose: 7.2 Gy     Planned Dose:  45+ Gy  Narrative . . . . . . . . The patient presents for routine under treatment assessment.  [name] is here for her 4th fraction of radiation to her Pelvis. She reports chronic pain as a 6/10 to her lower back after an injury years ago. She has chronic constipation, with bowel movements every 2-3 days. She is taking 1 colace daily and plans to begin taking miralax daily again soon. She reports no problems with urination at this time. She did receive chemotherapy yesterday and had no difficulty per her report.                                  Set-up films were reviewed.                                 The chart was checked. Physical Findings. . .  height is 5\' 2"  (1.575 m) and weight is 149 lb (67.6 kg). Her temperature is 98.2 F (36.8 C). Her blood pressure is 119/78 and her pulse is 88. Her oxygen saturation is 96%. . Weight essentially stable.  No significant changes. Lungs are clear to auscultation bilaterally. Heart has regular rate and rhythm. Abdomen soft, non-tender, normal bowel sounds. Impression . . . . . . . The patient is tolerating radiation. Plan . . . . . . . . . . . . Continue treatment as planned. ________________________________   Blair Promise, PhD, MD  This document serves as a record of services personally performed by Gery Pray, MD. It was created on his behalf by Darcus Austin, a trained medical scribe. The creation of this record is based on the scribe's personal observations and the provider's statements to them. This document has been checked and approved by the attending provider.

## 2016-09-28 NOTE — Telephone Encounter (Signed)
-----   Message from [place], RN sent at 09/27/2016 10:26 AM EST ----- Regarding: Dr. Marko Plume. fu chemo call Dr. Marko Plume. 1st time chemo f/u phone call. 1st time CDDP

## 2016-09-28 NOTE — Progress Notes (Signed)
[name] is here for her 4th fraction of radiation to her Pelvis. She reports chronic pain a 6/10 to her lower back after an injury years ago. She has chronic constipation, with bowel movements every 2-3 days. She is taking 1 colace daily, and plans to begin taking miralax daily again soon. She reports no problems with urination, at this time. She did receive chemotherapy yesterday, and had no difficulty per her report.   BP 119/78   Pulse 88   Temp 98.2 F (36.8 C)   Ht 5\' 2"  (1.575 m)   Wt 149 lb (67.6 kg)   SpO2 96% Comment: room air  BMI 27.25 kg/m    Wt Readings from Last 3 Encounters:  09/28/16 149 lb (67.6 kg)  09/27/16 146 lb 11.2 oz (66.5 kg)  09/14/16 143 lb 3.2 oz (65 kg)

## 2016-09-28 NOTE — Telephone Encounter (Signed)
Spoke with [name] while in radiation oncology this afternoon. No N/V. She is eating fine thus far. She is drinking fluids ~64+oz a day. Her bowels did not move yesterday or today so far.  She will take a capful of Miralax tonight. She did take colace yesterday. Afebrile 98.2 now in radiation oncology. She noted that her right arm is slightly swollen today. The IV site was in her right hand. The area is bruised and slightly tender. The anterior and medial side of wrist is lightly pink and tender to the touch. No red streaks noted. Told Grace Siciliano that Dr. Marko Plume said to apply warm soaks for ~15 minutes 3-4 times a day. She may use ibuprofen  as needed. She is to call Dr. Mariana Kaufman nurse if site becomes more red and or painful. Call for temperature of 100.5 or higher. Grace Conrod verbalized understanding.

## 2016-09-28 NOTE — Telephone Encounter (Signed)
Attempted to reach [name] to see how she is today after her first CDDP treatment yesterday. Could not leave a message as she does not have voice mail set up. Will try again later.

## 2016-09-29 ENCOUNTER — Ambulatory Visit
Admission: RE | Admit: 2016-09-29 | Discharge: 2016-09-29 | Disposition: A | Discharge status: Home / Self Care | Payer: Medicare Other | Source: Ambulatory Visit | Attending: Radiation Oncology | Admitting: Radiation Oncology

## 2016-09-29 DIAGNOSIS — Z90722 Acquired absence of ovaries, bilateral: Secondary | ICD-10-CM | POA: Diagnosis not present

## 2016-09-29 DIAGNOSIS — G8929 Other chronic pain: Secondary | ICD-10-CM | POA: Diagnosis not present

## 2016-09-29 DIAGNOSIS — C530 Malignant neoplasm of endocervix: Secondary | ICD-10-CM | POA: Diagnosis not present

## 2016-09-29 DIAGNOSIS — Z510 Encounter for antineoplastic radiation therapy: Secondary | ICD-10-CM | POA: Diagnosis not present

## 2016-09-29 DIAGNOSIS — Z90710 Acquired absence of both cervix and uterus: Secondary | ICD-10-CM | POA: Diagnosis not present

## 2016-09-29 DIAGNOSIS — M545 Low back pain: Secondary | ICD-10-CM | POA: Diagnosis not present

## 2016-09-30 ENCOUNTER — Ambulatory Visit
Admission: RE | Admit: 2016-09-30 | Discharge: 2016-09-30 | Disposition: A | Discharge status: Home / Self Care | Payer: Medicare Other | Source: Ambulatory Visit | Attending: Radiation Oncology | Admitting: Radiation Oncology

## 2016-09-30 DIAGNOSIS — M545 Low back pain: Secondary | ICD-10-CM | POA: Diagnosis not present

## 2016-09-30 DIAGNOSIS — Z510 Encounter for antineoplastic radiation therapy: Secondary | ICD-10-CM | POA: Diagnosis not present

## 2016-09-30 DIAGNOSIS — Z90710 Acquired absence of both cervix and uterus: Secondary | ICD-10-CM | POA: Diagnosis not present

## 2016-09-30 DIAGNOSIS — G8929 Other chronic pain: Secondary | ICD-10-CM | POA: Diagnosis not present

## 2016-09-30 DIAGNOSIS — Z90722 Acquired absence of ovaries, bilateral: Secondary | ICD-10-CM | POA: Diagnosis not present

## 2016-09-30 DIAGNOSIS — C530 Malignant neoplasm of endocervix: Secondary | ICD-10-CM | POA: Diagnosis not present

## 2016-10-01 ENCOUNTER — Ambulatory Visit
Admission: RE | Admit: 2016-10-01 | Discharge: 2016-10-01 | Disposition: A | Discharge status: Home / Self Care | Payer: Medicare Other | Source: Ambulatory Visit | Attending: Radiation Oncology | Admitting: Radiation Oncology

## 2016-10-01 DIAGNOSIS — M545 Low back pain: Secondary | ICD-10-CM | POA: Diagnosis not present

## 2016-10-01 DIAGNOSIS — C530 Malignant neoplasm of endocervix: Secondary | ICD-10-CM | POA: Diagnosis not present

## 2016-10-01 DIAGNOSIS — Z90710 Acquired absence of both cervix and uterus: Secondary | ICD-10-CM | POA: Diagnosis not present

## 2016-10-01 DIAGNOSIS — Z510 Encounter for antineoplastic radiation therapy: Secondary | ICD-10-CM | POA: Diagnosis not present

## 2016-10-01 DIAGNOSIS — Z90722 Acquired absence of ovaries, bilateral: Secondary | ICD-10-CM | POA: Diagnosis not present

## 2016-10-01 DIAGNOSIS — G8929 Other chronic pain: Secondary | ICD-10-CM | POA: Diagnosis not present

## 2016-10-03 ENCOUNTER — Other Ambulatory Visit: Payer: Self-pay | Admitting: Oncology

## 2016-10-04 ENCOUNTER — Ambulatory Visit: Payer: Medicare Other

## 2016-10-04 ENCOUNTER — Other Ambulatory Visit (HOSPITAL_BASED_OUTPATIENT_CLINIC_OR_DEPARTMENT_OTHER): Payer: Medicare Other

## 2016-10-04 ENCOUNTER — Other Ambulatory Visit: Payer: Self-pay | Admitting: Oncology

## 2016-10-04 ENCOUNTER — Ambulatory Visit
Admission: RE | Admit: 2016-10-04 | Discharge: 2016-10-04 | Disposition: A | Discharge status: Home / Self Care | Payer: Medicare Other | Source: Ambulatory Visit | Attending: Radiation Oncology | Admitting: Radiation Oncology

## 2016-10-04 ENCOUNTER — Ambulatory Visit (HOSPITAL_BASED_OUTPATIENT_CLINIC_OR_DEPARTMENT_OTHER): Payer: Medicare Other | Admitting: Oncology

## 2016-10-04 VITALS — BP 130/89 | HR 90 | Temp 98.4°F

## 2016-10-04 VITALS — BP 153/78 | HR 86 | Temp 97.8°F | Resp 18 | Ht 62.0 in | Wt 144.8 lb

## 2016-10-04 DIAGNOSIS — K5903 Drug induced constipation: Secondary | ICD-10-CM

## 2016-10-04 DIAGNOSIS — C539 Malignant neoplasm of cervix uteri, unspecified: Secondary | ICD-10-CM

## 2016-10-04 DIAGNOSIS — G8929 Other chronic pain: Secondary | ICD-10-CM | POA: Diagnosis not present

## 2016-10-04 DIAGNOSIS — I878 Other specified disorders of veins: Secondary | ICD-10-CM

## 2016-10-04 DIAGNOSIS — G8921 Chronic pain due to trauma: Secondary | ICD-10-CM | POA: Diagnosis not present

## 2016-10-04 DIAGNOSIS — C530 Malignant neoplasm of endocervix: Secondary | ICD-10-CM

## 2016-10-04 DIAGNOSIS — Z90710 Acquired absence of both cervix and uterus: Secondary | ICD-10-CM | POA: Diagnosis not present

## 2016-10-04 DIAGNOSIS — M545 Low back pain: Secondary | ICD-10-CM | POA: Diagnosis not present

## 2016-10-04 DIAGNOSIS — Z90722 Acquired absence of ovaries, bilateral: Secondary | ICD-10-CM | POA: Diagnosis not present

## 2016-10-04 DIAGNOSIS — Z510 Encounter for antineoplastic radiation therapy: Secondary | ICD-10-CM | POA: Diagnosis not present

## 2016-10-04 LAB — CBC WITH DIFFERENTIAL/PLATELET
BASO%: 0.4 % (ref 0.0–2.0)
BASOS ABS: 0 10*3/uL (ref 0.0–0.1)
EOS%: 3.9 % (ref 0.0–7.0)
Eosinophils Absolute: 0.2 10*3/uL (ref 0.0–0.5)
HCT: 37.2 % (ref 34.8–46.6)
HGB: 12.7 g/dL (ref 11.6–15.9)
LYMPH%: 26.8 % (ref 14.0–49.7)
MCH: 32.3 pg (ref 25.1–34.0)
MCHC: 34.1 g/dL (ref 31.5–36.0)
MCV: 94.7 fL (ref 79.5–101.0)
MONO#: 0.5 10*3/uL (ref 0.1–0.9)
MONO%: 10.1 % (ref 0.0–14.0)
NEUT#: 2.8 10*3/uL (ref 1.5–6.5)
NEUT%: 58.8 % (ref 38.4–76.8)
Platelets: 178 10*3/uL (ref 145–400)
RBC: 3.93 10*6/uL (ref 3.70–5.45)
RDW: 11.9 % (ref 11.2–14.5)
WBC: 4.7 10*3/uL (ref 3.9–10.3)
lymph#: 1.3 10*3/uL (ref 0.9–3.3)

## 2016-10-04 LAB — COMPREHENSIVE METABOLIC PANEL
ALT: 18 U/L (ref 0–55)
AST: 20 U/L (ref 5–34)
Albumin: 3.6 g/dL (ref 3.5–5.0)
Alkaline Phosphatase: 114 U/L (ref 40–150)
Anion Gap: 10 mEq/L (ref 3–11)
BUN: 10.2 mg/dL (ref 7.0–26.0)
CHLORIDE: 100 meq/L (ref 98–109)
CO2: 26 meq/L (ref 22–29)
CREATININE: 0.7 mg/dL (ref 0.6–1.1)
Calcium: 9.5 mg/dL (ref 8.4–10.4)
EGFR: >90 mL/min/{1.73_m2} (ref >90)
GLUCOSE: 99 mg/dL (ref 70–140)
POTASSIUM: 4.1 meq/L (ref 3.5–5.1)
SODIUM: 136 meq/L (ref 136–145)
Total Bilirubin: 0.33 mg/dL (ref 0.20–1.20)
Total Protein: 6.9 g/dL (ref 6.4–8.3)

## 2016-10-04 LAB — MAGNESIUM: Magnesium: 2 mg/dl (ref 1.5–2.5)

## 2016-10-04 MED ORDER — SODIUM CHLORIDE 0.9 % IV SOLN: Freq: Once | INTRAVENOUS | Status: DC

## 2016-10-04 MED ORDER — POTASSIUM CHLORIDE 2 MEQ/ML IV SOLN
Freq: Once | INTRAVENOUS | Status: DC
  Filled 2016-10-04: qty 10

## 2016-10-04 NOTE — Progress Notes (Signed)
Unable to obtain IV access after multiple attempts by several nurses. Dr Marko Plume was notified, and pt did not receive treatment today.

## 2016-10-04 NOTE — Progress Notes (Signed)
OFFICE PROGRESS NOTE   October 04, 2016   Physicians:E.Veneta Penton, MD(PCP), Ocie Cornfield, Renie Ora with [facility], Elta Guadeloupe Carloyn Manner), Kinnie Scales  INTERVAL HISTORY:  Patient is seen, alone for visit, under treatment with radiation and sensitizing chemotherapy for clinical IB2 adenocarcinoma of endocervix. PET 08-31-16 had 8 mm RUL lung nodule which needs f/u with next scans, but no clear metastatic disease.  Patient had first CDDP on 09-27-16, IV access difficult and some swelling at IV site noted 09-28-16, which has nearly resolved with conservative measures. Unfortunately peripheral IV access could not be obtained for planned chemo today, 7 attempts. She will have PICC placed by IR, with chemo rescheduled for after that procedure later this week.  Patient had some fatigue and some intermittent mild nausea, but did all of oral prehydration prior to attempt at chemo today, and she is eating. Bowels have moved daily since colace last week. Areas of multiple dental extractions seem to be healing well, still using peridex She denies any bleeding now, no skin irritation in radiation area. 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 No LE swelling. Bladder ok. No fever or symptoms of infection. New HA today, but slept little last pm with oral hydration, and difficult time with IV access now. Remainder of 10 point Review of Systems negative.    No central catheter Flu vaccine 08-06-16 No genetics counseling (Note family history osteosarcoma, leukemia, colon ca)  ONCOLOGIC HISTORY Patient had intermittent vaginal spotting beginning ~ 04-2016. She had sonohystogram by Dr Matthew Saras 06-01-16 with 2.4 cm endometrial polyp identified. 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 She saw Dr Denman George in consultation, then had robotic assisted TAH BSO  sentinel node on 08-05-16. Surgical pathology [id number] invasive adenocarcinoma of endocervix with isolated tumor cells in right external iliac node and micromet in left external iliac node. Review of path confirms endocervical primary without synchronous endometrial carcinoma. PET 08-31-16 no obvious metastatic disease, 8 mm RUL lung nodule will be followed up on next scans. Recommendation is radiation with sensitizing chemotherapy. First CDDP given 09-27-16. Second CDDP delayed due to inadequate venous access.   Objective:  Vital signs in last 24 hours:  BP (!) 153/78 (BP Location: Left Arm, Patient Position: Sitting) Comment: informed Louise  Pulse 86   Temp 97.8 F (36.6 C) (Oral)   Resp 18   Ht '5\' 2"'$  (1.575 m)   Wt 144 lb 12.8 oz (65.7 kg)   SpO2 99%   BMI 26.48 kg/m  Weight down 2 lbs Alert, oriented and appropriate. Ambulatory without difficulty.  No alopecia  HEENT:PERRL, sclerae not icteric. Oral mucosa moist without lesions, posterior pharynx clear.  Neck supple. No JVD.  Lymphatics:no cervical,supraclavicular adenopathy Resp: clear to auscultation bilaterally  Cardio: regular rate and rhythm. No gallop. GI: soft, nontender, not distended, no mass or organomegaly. Normally active bowel sounds. Musculoskeletal/ Extremities: LE without pitting edema, cords, tenderness Neuro: no change chronic LE peripheral neuropathy. Otherwise nonfocal, including speech fluent, CN and motor intact. Skin  Right mid forearm with minimal pinkness, no swelling, no cords at site of IV from last week. Skin otherwise without rash, ecchymosis, petechiae   Lab Results:  Results for orders placed or performed in visit on 10/04/16  CBC with Differential  Result Value Ref Range   WBC 4.7 3.9 - 10.3 10e3/uL   NEUT# 2.8 1.5 -  6.5 10e3/uL   HGB 12.7 11.6 - 15.9 g/dL   HCT 37.2 34.8 - 46.6 %   Platelets 178 145 - 400 10e3/uL   MCV 94.7 79.5 - 101.0 fL   MCH 32.3 25.1 - 34.0 pg   MCHC 34.1 31.5 -  36.0 g/dL   RBC 3.93 3.70 - 5.45 10e6/uL   RDW 11.9 11.2 - 14.5 %   lymph# 1.3 0.9 - 3.3 10e3/uL   MONO# 0.5 0.1 - 0.9 10e3/uL   Eosinophils Absolute 0.2 0.0 - 0.5 10e3/uL   Basophils Absolute 0.0 0.0 - 0.1 10e3/uL   NEUT% 58.8 38.4 - 76.8 %   LYMPH% 26.8 14.0 - 49.7 %   MONO% 10.1 0.0 - 14.0 %   EOS% 3.9 0.0 - 7.0 %   BASO% 0.4 0.0 - 2.0 %  Comprehensive metabolic panel  Result Value Ref Range   Sodium 136 136 - 145 mEq/L   Potassium 4.1 3.5 - 5.1 mEq/L   Chloride 100 98 - 109 mEq/L   CO2 26 22 - 29 mEq/L   Glucose 99 70 - 140 mg/dl   BUN 10.2 7.0 - 26.0 mg/dL   Creatinine 0.7 0.6 - 1.1 mg/dL   Total Bilirubin 0.33 0.20 - 1.20 mg/dL   Alkaline Phosphatase 114 40 - 150 U/L   AST 20 5 - 34 U/L   ALT 18 0 - 55 U/L   Total Protein 6.9 6.4 - 8.3 g/dL   Albumin 3.6 3.5 - 5.0 g/dL   Calcium 9.5 8.4 - 10.4 mg/dL   Anion Gap 10 3 - 11 mEq/L   EGFR >90 >90 ml/min/1.73 m2  Magnesium  Result Value Ref Range   Magnesium 2.0 1.5 - 2.5 mg/dl     Studies/Results:  No results found.  Medications: I have reviewed the patient's current medications. Reviewed use of colace and miralax to keep bowels moving daily OK to use tylenol for HA when she gets home today   DISCUSSION  Discussed PICC vs PAC. Given short term need for venous access, she is in agreement with PICC. She understands that this will need flushes 2x weekly, which can be coordinated with chemo and with RT visits. Discussed care of the line, including not getting this wet with bathing or showering. She is aware that there is infection risk with any central line.  PICC can be placed by IR at [facility] this afternoon or on 11-14, then chem can be given 11-15. Patient requests PICC on 11-14, scheduled now.   Assessment/Plan:  1. IB2 endocervical adenocarcinoma: Path reviewed, consensus that there is not a synchronous endometrial carcinoma. No obvious metastatic disease by PET 08-31-16. First sensitizing CDDP 09-27-16, planned  weekly with radiation, weekly labs, MD appointments coordinated. 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 On chronic pain meds by pain management clinic. 3.inadequate peripheral IV access for planned chemotherapy. As we anticipate 5 total treatments of CDDP, thru 10-25-16, PICC seems best  central line choice. PICC flushes 2x weekly, dressing change weekly, coordinate with other schedule  If she needs additional chemo beyond sensitizing CDDP, would need PAC. 4.flu vaccine 08-06-16 5.family history osteosarcoma in son, leukemia mother and sister, colon cancer . Consider genetics counseling. 6.post extractions of 10 teeth by Dr Enrique Sack 09-08-16, healing well. Still caries that need to be addressed by dentist of choice after chemo/ RT. 7. 8 mm RUL pulmonary nodule on PET/ CT, will need to follow up with next scans. Question of nodular opacity left lung  base by pre op CXR.  Note pneumonia 2015 was RLL per xray reports. 8.overdue mammograms, last at Dr [facility] office ~ 2 years ago.  9.never colonoscopy 10.osteoporosis per patient, no DEXA in this EMR 11. constipation related to pain medication and recent surgery: increased bowel program helpful  All questions answered and she is in agreement with recommendations and plans. Chemo orders adjusted. Time spent 25 min including >50% counseling and coordination of care. Route PCP, cc Drs Sondra Come and Britt Bolognese, MD   10/04/2016, 5:34 PM

## 2016-10-05 ENCOUNTER — Other Ambulatory Visit: Payer: Self-pay | Admitting: Oncology

## 2016-10-05 ENCOUNTER — Ambulatory Visit (HOSPITAL_COMMUNITY)
Admission: RE | Admit: 2016-10-05 | Discharge: 2016-10-05 | Disposition: A | Discharge status: Home / Self Care | Payer: Medicare Other | Source: Ambulatory Visit | Attending: Interventional Radiology | Admitting: Interventional Radiology

## 2016-10-05 ENCOUNTER — Encounter: Payer: Self-pay | Admitting: Oncology

## 2016-10-05 ENCOUNTER — Encounter (HOSPITAL_COMMUNITY): Payer: Self-pay | Admitting: Interventional Radiology

## 2016-10-05 ENCOUNTER — Ambulatory Visit
Admission: RE | Admit: 2016-10-05 | Discharge: 2016-10-05 | Disposition: A | Discharge status: Home / Self Care | Payer: Medicare Other | Source: Ambulatory Visit | Attending: Radiation Oncology | Admitting: Radiation Oncology

## 2016-10-05 ENCOUNTER — Ambulatory Visit: Payer: Self-pay

## 2016-10-05 VITALS — BP 119/75 | HR 92 | Temp 98.2°F | Ht 62.0 in | Wt 142.4 lb

## 2016-10-05 DIAGNOSIS — I878 Other specified disorders of veins: Secondary | ICD-10-CM | POA: Insufficient documentation

## 2016-10-05 DIAGNOSIS — C539 Malignant neoplasm of cervix uteri, unspecified: Secondary | ICD-10-CM | POA: Insufficient documentation

## 2016-10-05 DIAGNOSIS — G8929 Other chronic pain: Secondary | ICD-10-CM | POA: Diagnosis not present

## 2016-10-05 DIAGNOSIS — Z90722 Acquired absence of ovaries, bilateral: Secondary | ICD-10-CM | POA: Diagnosis not present

## 2016-10-05 DIAGNOSIS — M545 Low back pain: Secondary | ICD-10-CM | POA: Diagnosis not present

## 2016-10-05 DIAGNOSIS — C530 Malignant neoplasm of endocervix: Secondary | ICD-10-CM | POA: Diagnosis not present

## 2016-10-05 DIAGNOSIS — Z510 Encounter for antineoplastic radiation therapy: Secondary | ICD-10-CM | POA: Diagnosis not present

## 2016-10-05 DIAGNOSIS — Z90710 Acquired absence of both cervix and uterus: Secondary | ICD-10-CM | POA: Diagnosis not present

## 2016-10-05 DIAGNOSIS — Z452 Encounter for adjustment and management of vascular access device: Secondary | ICD-10-CM | POA: Diagnosis not present

## 2016-10-05 HISTORY — PX: IR GENERIC HISTORICAL: IMG1180011

## 2016-10-05 MED ORDER — LIDOCAINE HCL 1 % IJ SOLN
INTRAMUSCULAR | Status: AC
  Filled 2016-10-05: qty 20

## 2016-10-05 MED ORDER — HEPARIN SOD (PORK) LOCK FLUSH 100 UNIT/ML IV SOLN
INTRAVENOUS | Status: AC
  Filled 2016-10-05: qty 5

## 2016-10-05 NOTE — Progress Notes (Signed)
Manahil has completed 9 fractions to her pelvis.  She continues to report having pain in her lower back and said it is at a good level today.  She denies having any bladder or bowel issues.  She also denies having any vaginal bleeding.  She does reports having fatigue.  She did not have chemotherapy yesterday due to not being able to start an IV.  She had a PICC line placed today.  BP 119/75 (BP Location: Left Arm, Patient Position: Sitting)   Pulse 92   Temp 98.2 F (36.8 C) (Oral)   Ht 5\' 2"  (1.575 m)   Wt 142 lb 6.4 oz (64.6 kg)   SpO2 99%   BMI 26.05 kg/m    Wt Readings from Last 3 Encounters:  10/05/16 142 lb 6.4 oz (64.6 kg)  10/04/16 144 lb 12.8 oz (65.7 kg)  09/28/16 149 lb (67.6 kg)

## 2016-10-05 NOTE — Progress Notes (Unsigned)
Medical Oncology  Scheduling message sent as follows  PICC flush 11-17 coordinate with RT PICC flush 11-24 coordinate with RT PICC flush 11-30  coordinate with RT LL 11-27 0830 or see in infusion ~ 1215 if still open LL 12-4 at 1200 or 1230 if still open, see in infusion  L.Marko Plume MD

## 2016-10-05 NOTE — Progress Notes (Signed)
  Radiation Oncology         [phone] ________________________________  Name: [name] MRN: [id number]  Date: 10/05/2016  DOB: 10/23/1951  Weekly Radiation Therapy Management    ICD-9-CM ICD-10-CM   1. Cervical cancer, FIGO stage IB2 (HCC) 180.9 C53.9      Current Dose: 16.2 Gy     Planned Dose:  45+ Gy  Narrative . . . . . . . . The patient presents for routine under treatment assessment.                                 [name] has completed 9 fractions to her pelvis.  She continues to report having pain in her lower back and said it is at a good level today.  She denies having any bladder or bowel issues.  She also denies having any vaginal bleeding.  She does reports having fatigue.  She did not have chemotherapy yesterday due to not being able to start an IV.  She had a PICC line placed today.                                   Set-up films were reviewed.                                 The chart was checked. Physical Findings. . .  height is 5\' 2"  (1.575 m) and weight is 142 lb 6.4 oz (64.6 kg). Her oral temperature is 98.2 F (36.8 C). Her blood pressure is 119/75 and her pulse is 92. Her oxygen saturation is 99%. . Weight essentially stable.  Lungs are clear to auscultation bilaterally. Heart has regular rate and rhythm. No palpable cervical, supraclavicular, or axillary adenopathy. Abdomen soft, non-tender, normal bowel sounds.  Impression . . . . . . . The patient is tolerating radiation. Plan . . . . . . . . . . . . Continue treatment as planned.  ________________________________   Blair Promise, PhD, MD

## 2016-10-05 NOTE — Procedures (Signed)
RUE PICC SVC RA 36 cm No comp/EBL

## 2016-10-06 ENCOUNTER — Telehealth: Payer: Self-pay | Admitting: *Deleted

## 2016-10-06 ENCOUNTER — Ambulatory Visit (HOSPITAL_BASED_OUTPATIENT_CLINIC_OR_DEPARTMENT_OTHER): Payer: Medicare Other

## 2016-10-06 ENCOUNTER — Encounter: Payer: Self-pay | Admitting: Oncology

## 2016-10-06 ENCOUNTER — Ambulatory Visit
Admission: RE | Admit: 2016-10-06 | Discharge: 2016-10-06 | Disposition: A | Discharge status: Home / Self Care | Payer: Medicare Other | Source: Ambulatory Visit | Attending: Radiation Oncology | Admitting: Radiation Oncology

## 2016-10-06 VITALS — BP 117/78 | HR 88 | Temp 98.4°F | Resp 16

## 2016-10-06 DIAGNOSIS — C530 Malignant neoplasm of endocervix: Secondary | ICD-10-CM

## 2016-10-06 DIAGNOSIS — Z90710 Acquired absence of both cervix and uterus: Secondary | ICD-10-CM | POA: Diagnosis not present

## 2016-10-06 DIAGNOSIS — Z5111 Encounter for antineoplastic chemotherapy: Secondary | ICD-10-CM

## 2016-10-06 DIAGNOSIS — Z90722 Acquired absence of ovaries, bilateral: Secondary | ICD-10-CM | POA: Diagnosis not present

## 2016-10-06 DIAGNOSIS — Z510 Encounter for antineoplastic radiation therapy: Secondary | ICD-10-CM | POA: Diagnosis not present

## 2016-10-06 DIAGNOSIS — G8929 Other chronic pain: Secondary | ICD-10-CM | POA: Diagnosis not present

## 2016-10-06 DIAGNOSIS — C539 Malignant neoplasm of cervix uteri, unspecified: Secondary | ICD-10-CM

## 2016-10-06 DIAGNOSIS — M545 Low back pain: Secondary | ICD-10-CM | POA: Diagnosis not present

## 2016-10-06 MED ORDER — SODIUM CHLORIDE 0.9 % IV SOLN
40.0000 mg/m2 | Freq: Once | INTRAVENOUS | Status: AC
  Administered 2016-10-06: 68 mg via INTRAVENOUS
  Filled 2016-10-06: qty 68

## 2016-10-06 MED ORDER — SODIUM CHLORIDE 0.9% FLUSH
10.0000 mL | INTRAVENOUS | Status: DC | PRN
  Administered 2016-10-06: 10 mL
  Filled 2016-10-06: qty 10

## 2016-10-06 MED ORDER — LORAZEPAM 2 MG/ML IJ SOLN
0.5000 mg | Freq: Once | INTRAMUSCULAR | Status: AC | PRN
  Administered 2016-10-06: 0.5 mg via INTRAVENOUS

## 2016-10-06 MED ORDER — ONDANSETRON HCL 8 MG PO TABS
ORAL_TABLET | ORAL | Status: AC
  Filled 2016-10-06: qty 1

## 2016-10-06 MED ORDER — HEPARIN SOD (PORK) LOCK FLUSH 100 UNIT/ML IV SOLN
250.0000 [IU] | Freq: Once | INTRAVENOUS | Status: AC | PRN
  Administered 2016-10-06: 250 [IU]
  Filled 2016-10-06: qty 5

## 2016-10-06 MED ORDER — LORAZEPAM 2 MG/ML IJ SOLN
INTRAMUSCULAR | Status: AC
  Filled 2016-10-06: qty 1

## 2016-10-06 MED ORDER — POTASSIUM CHLORIDE 2 MEQ/ML IV SOLN
Freq: Once | INTRAVENOUS | Status: AC
  Administered 2016-10-06: 12:00:00 via INTRAVENOUS
  Filled 2016-10-06: qty 10

## 2016-10-06 MED ORDER — SODIUM CHLORIDE 0.9 % IV SOLN
Freq: Once | INTRAVENOUS | Status: AC
  Administered 2016-10-06: 12:00:00 via INTRAVENOUS

## 2016-10-06 MED ORDER — SODIUM CHLORIDE 0.9 % IV SOLN
Freq: Once | INTRAVENOUS | Status: AC
  Administered 2016-10-06: 14:00:00 via INTRAVENOUS
  Filled 2016-10-06: qty 5

## 2016-10-06 MED ORDER — ONDANSETRON HCL 8 MG PO TABS
8.0000 mg | ORAL_TABLET | Freq: Once | ORAL | Status: AC
  Administered 2016-10-06: 8 mg via ORAL

## 2016-10-06 NOTE — Progress Notes (Signed)
Per Jenny Reichmann, pt is approved for the $400 [facility] and $400 [employer] grants.

## 2016-10-06 NOTE — Telephone Encounter (Deleted)
Per LOS I have scheduled appts and notified the scheduler 

## 2016-10-06 NOTE — Patient Instructions (Signed)
[facility] Discharge Instructions for Patients Receiving Chemotherapy  Today you received the following chemotherapy agents: Cisplatin   To help prevent nausea and vomiting after your treatment, we encourage you to take your nausea medication as directed.    If you develop nausea and vomiting that is not controlled by your nausea medication, call the clinic.   BELOW ARE SYMPTOMS THAT SHOULD BE REPORTED IMMEDIATELY:  *FEVER GREATER THAN 100.5 F  *CHILLS WITH OR WITHOUT FEVER  NAUSEA AND VOMITING THAT IS NOT CONTROLLED WITH YOUR NAUSEA MEDICATION  *UNUSUAL SHORTNESS OF BREATH  *UNUSUAL BRUISING OR BLEEDING  TENDERNESS IN MOUTH AND THROAT WITH OR WITHOUT PRESENCE OF ULCERS  *URINARY PROBLEMS  *BOWEL PROBLEMS  UNUSUAL RASH Items with * indicate a potential emergency and should be followed up as soon as possible.  Feel free to call the clinic you have any questions or concerns. The clinic phone number is [phone].  Please show the CHEMO ALERT CARD at check-in to the Emergency Department and triage nurse.   

## 2016-10-07 ENCOUNTER — Encounter: Payer: Self-pay | Admitting: General Practice

## 2016-10-07 ENCOUNTER — Ambulatory Visit
Admission: RE | Admit: 2016-10-07 | Discharge: 2016-10-07 | Disposition: A | Discharge status: Home / Self Care | Payer: Medicare Other | Source: Ambulatory Visit | Attending: Radiation Oncology | Admitting: Radiation Oncology

## 2016-10-07 DIAGNOSIS — Z510 Encounter for antineoplastic radiation therapy: Secondary | ICD-10-CM | POA: Diagnosis not present

## 2016-10-07 DIAGNOSIS — G8929 Other chronic pain: Secondary | ICD-10-CM | POA: Diagnosis not present

## 2016-10-07 DIAGNOSIS — C530 Malignant neoplasm of endocervix: Secondary | ICD-10-CM | POA: Diagnosis not present

## 2016-10-07 DIAGNOSIS — Z90722 Acquired absence of ovaries, bilateral: Secondary | ICD-10-CM | POA: Diagnosis not present

## 2016-10-07 DIAGNOSIS — Z90710 Acquired absence of both cervix and uterus: Secondary | ICD-10-CM | POA: Diagnosis not present

## 2016-10-07 DIAGNOSIS — M545 Low back pain: Secondary | ICD-10-CM | POA: Diagnosis not present

## 2016-10-07 NOTE — Progress Notes (Signed)
[facility] Spiritual Care Note  Attempted f/u by phone, but pt's VM not set up.  Plan to see pt in infusion on 11/27 and to send note of encouragement with [place] team/resource information in the meantime.     [place], [place], [facility] Pager [phone] Voicemail [phone]

## 2016-10-08 ENCOUNTER — Ambulatory Visit
Admission: RE | Admit: 2016-10-08 | Discharge: 2016-10-08 | Disposition: A | Discharge status: Home / Self Care | Payer: Medicare Other | Source: Ambulatory Visit | Attending: Radiation Oncology | Admitting: Radiation Oncology

## 2016-10-08 ENCOUNTER — Encounter: Payer: Self-pay | Admitting: General Practice

## 2016-10-08 ENCOUNTER — Ambulatory Visit (HOSPITAL_BASED_OUTPATIENT_CLINIC_OR_DEPARTMENT_OTHER): Payer: Medicare Other

## 2016-10-08 DIAGNOSIS — G8929 Other chronic pain: Secondary | ICD-10-CM | POA: Diagnosis not present

## 2016-10-08 DIAGNOSIS — C530 Malignant neoplasm of endocervix: Secondary | ICD-10-CM | POA: Diagnosis not present

## 2016-10-08 DIAGNOSIS — Z452 Encounter for adjustment and management of vascular access device: Secondary | ICD-10-CM

## 2016-10-08 DIAGNOSIS — M545 Low back pain: Secondary | ICD-10-CM | POA: Diagnosis not present

## 2016-10-08 DIAGNOSIS — Z510 Encounter for antineoplastic radiation therapy: Secondary | ICD-10-CM | POA: Diagnosis not present

## 2016-10-08 DIAGNOSIS — Z90722 Acquired absence of ovaries, bilateral: Secondary | ICD-10-CM | POA: Diagnosis not present

## 2016-10-08 DIAGNOSIS — Z90710 Acquired absence of both cervix and uterus: Secondary | ICD-10-CM | POA: Diagnosis not present

## 2016-10-08 MED ORDER — HEPARIN SOD (PORK) LOCK FLUSH 100 UNIT/ML IV SOLN
250.0000 [IU] | Freq: Once | INTRAVENOUS | Status: AC
  Administered 2016-10-08: 250 [IU] via INTRAVENOUS
  Filled 2016-10-08: qty 5

## 2016-10-08 MED ORDER — SODIUM CHLORIDE 0.9 % IJ SOLN
10.0000 mL | Freq: Once | INTRAMUSCULAR | Status: AC
  Administered 2016-10-08: 10 mL via INTRAVENOUS
  Filled 2016-10-08: qty 10

## 2016-10-08 NOTE — Progress Notes (Signed)
[facility] Spiritual Care Note  Mailed notecard of encouragement, including spiritual care and counseling brochures to introduce Support Team availability in more detail.   [place], [place], [facility] Pager [phone] Voicemail [phone]

## 2016-10-08 NOTE — Telephone Encounter (Signed)
scheduleing

## 2016-10-10 ENCOUNTER — Other Ambulatory Visit: Payer: Self-pay | Admitting: Oncology

## 2016-10-10 ENCOUNTER — Ambulatory Visit
Admission: RE | Admit: 2016-10-10 | Discharge: 2016-10-10 | Disposition: A | Discharge status: Home / Self Care | Payer: Medicare Other | Source: Ambulatory Visit | Attending: Radiation Oncology | Admitting: Radiation Oncology

## 2016-10-10 DIAGNOSIS — M545 Low back pain: Secondary | ICD-10-CM | POA: Diagnosis not present

## 2016-10-10 DIAGNOSIS — G8929 Other chronic pain: Secondary | ICD-10-CM | POA: Diagnosis not present

## 2016-10-10 DIAGNOSIS — Z90722 Acquired absence of ovaries, bilateral: Secondary | ICD-10-CM | POA: Diagnosis not present

## 2016-10-10 DIAGNOSIS — C530 Malignant neoplasm of endocervix: Secondary | ICD-10-CM | POA: Diagnosis not present

## 2016-10-10 DIAGNOSIS — Z510 Encounter for antineoplastic radiation therapy: Secondary | ICD-10-CM | POA: Diagnosis not present

## 2016-10-10 DIAGNOSIS — Z90710 Acquired absence of both cervix and uterus: Secondary | ICD-10-CM | POA: Diagnosis not present

## 2016-10-11 ENCOUNTER — Telehealth: Payer: Self-pay | Admitting: Oncology

## 2016-10-11 ENCOUNTER — Other Ambulatory Visit (HOSPITAL_BASED_OUTPATIENT_CLINIC_OR_DEPARTMENT_OTHER): Payer: Medicare Other

## 2016-10-11 ENCOUNTER — Ambulatory Visit (HOSPITAL_BASED_OUTPATIENT_CLINIC_OR_DEPARTMENT_OTHER): Payer: Medicare Other | Admitting: Oncology

## 2016-10-11 ENCOUNTER — Encounter: Payer: Self-pay | Admitting: Oncology

## 2016-10-11 ENCOUNTER — Ambulatory Visit (HOSPITAL_BASED_OUTPATIENT_CLINIC_OR_DEPARTMENT_OTHER): Payer: Medicare Other

## 2016-10-11 ENCOUNTER — Ambulatory Visit
Admission: RE | Admit: 2016-10-11 | Discharge: 2016-10-11 | Disposition: A | Discharge status: Home / Self Care | Payer: Medicare Other | Source: Ambulatory Visit | Attending: Radiation Oncology | Admitting: Radiation Oncology

## 2016-10-11 VITALS — BP 129/85 | HR 81 | Temp 97.7°F | Resp 18 | Ht 62.0 in | Wt 142.2 lb

## 2016-10-11 DIAGNOSIS — Z5111 Encounter for antineoplastic chemotherapy: Secondary | ICD-10-CM

## 2016-10-11 DIAGNOSIS — Z90722 Acquired absence of ovaries, bilateral: Secondary | ICD-10-CM | POA: Diagnosis not present

## 2016-10-11 DIAGNOSIS — G8929 Other chronic pain: Secondary | ICD-10-CM

## 2016-10-11 DIAGNOSIS — Z79899 Other long term (current) drug therapy: Secondary | ICD-10-CM

## 2016-10-11 DIAGNOSIS — Z90710 Acquired absence of both cervix and uterus: Secondary | ICD-10-CM | POA: Diagnosis not present

## 2016-10-11 DIAGNOSIS — C539 Malignant neoplasm of cervix uteri, unspecified: Secondary | ICD-10-CM

## 2016-10-11 DIAGNOSIS — M810 Age-related osteoporosis without current pathological fracture: Secondary | ICD-10-CM

## 2016-10-11 DIAGNOSIS — G8921 Chronic pain due to trauma: Secondary | ICD-10-CM

## 2016-10-11 DIAGNOSIS — R911 Solitary pulmonary nodule: Secondary | ICD-10-CM | POA: Diagnosis not present

## 2016-10-11 DIAGNOSIS — C530 Malignant neoplasm of endocervix: Secondary | ICD-10-CM

## 2016-10-11 DIAGNOSIS — K5903 Drug induced constipation: Secondary | ICD-10-CM | POA: Diagnosis not present

## 2016-10-11 DIAGNOSIS — Z452 Encounter for adjustment and management of vascular access device: Secondary | ICD-10-CM

## 2016-10-11 DIAGNOSIS — M545 Low back pain: Secondary | ICD-10-CM | POA: Diagnosis not present

## 2016-10-11 DIAGNOSIS — Z510 Encounter for antineoplastic radiation therapy: Secondary | ICD-10-CM | POA: Diagnosis not present

## 2016-10-11 LAB — COMPREHENSIVE METABOLIC PANEL
ALBUMIN: 3.5 g/dL (ref 3.5–5.0)
ALK PHOS: 109 U/L (ref 40–150)
ALT: 22 U/L (ref 0–55)
ANION GAP: 12 meq/L — AB (ref 3–11)
AST: 19 U/L (ref 5–34)
BUN: 10.4 mg/dL (ref 7.0–26.0)
CALCIUM: 9.4 mg/dL (ref 8.4–10.4)
CHLORIDE: 105 meq/L (ref 98–109)
CO2: 22 mEq/L (ref 22–29)
Creatinine: 0.8 mg/dL (ref 0.6–1.1)
EGFR: 74 mL/min/{1.73_m2} — AB (ref >90)
Glucose: 102 mg/dl (ref 70–140)
POTASSIUM: 3.6 meq/L (ref 3.5–5.1)
Sodium: 140 mEq/L (ref 136–145)
Total Bilirubin: 0.41 mg/dL (ref 0.20–1.20)
Total Protein: 6.7 g/dL (ref 6.4–8.3)

## 2016-10-11 LAB — CBC WITH DIFFERENTIAL/PLATELET
BASO%: 0.1 % (ref 0.0–2.0)
BASOS ABS: 0 10*3/uL (ref 0.0–0.1)
EOS ABS: 0.1 10*3/uL (ref 0.0–0.5)
EOS%: 2.5 % (ref 0.0–7.0)
HEMATOCRIT: 37.8 % (ref 34.8–46.6)
HEMOGLOBIN: 12.7 g/dL (ref 11.6–15.9)
LYMPH%: 12.1 % — ABNORMAL LOW (ref 14.0–49.7)
MCH: 31.8 pg (ref 25.1–34.0)
MCHC: 33.5 g/dL (ref 31.5–36.0)
MCV: 95.1 fL (ref 79.5–101.0)
MONO#: 0.4 10*3/uL (ref 0.1–0.9)
MONO%: 6.5 % (ref 0.0–14.0)
NEUT#: 4.7 10*3/uL (ref 1.5–6.5)
NEUT%: 78.8 % — AB (ref 38.4–76.8)
Platelets: 122 10*3/uL — ABNORMAL LOW (ref 145–400)
RBC: 3.98 10*6/uL (ref 3.70–5.45)
RDW: 12.4 % (ref 11.2–14.5)
WBC: 5.9 10*3/uL (ref 3.9–10.3)
lymph#: 0.7 10*3/uL — ABNORMAL LOW (ref 0.9–3.3)

## 2016-10-11 LAB — MAGNESIUM: MAGNESIUM: 1.6 mg/dL (ref 1.5–2.5)

## 2016-10-11 MED ORDER — ONDANSETRON HCL 8 MG PO TABS
8.0000 mg | ORAL_TABLET | Freq: Once | ORAL | Status: AC
  Administered 2016-10-11: 8 mg via ORAL

## 2016-10-11 MED ORDER — POTASSIUM CHLORIDE 2 MEQ/ML IV SOLN
Freq: Once | INTRAVENOUS | Status: AC
  Administered 2016-10-11: 10:00:00 via INTRAVENOUS
  Filled 2016-10-11: qty 10

## 2016-10-11 MED ORDER — ONDANSETRON HCL 8 MG PO TABS
ORAL_TABLET | ORAL | Status: AC
  Filled 2016-10-11: qty 1

## 2016-10-11 MED ORDER — SODIUM CHLORIDE 0.9 % IV SOLN
40.0000 mg/m2 | Freq: Once | INTRAVENOUS | Status: AC
  Administered 2016-10-11: 68 mg via INTRAVENOUS
  Filled 2016-10-11: qty 68

## 2016-10-11 MED ORDER — OXYCODONE-ACETAMINOPHEN 5-325 MG PO TABS
1.0000 | ORAL_TABLET | Freq: Once | ORAL | Status: DC
Start: 2016-10-11 — End: 2016-10-11

## 2016-10-11 MED ORDER — SODIUM CHLORIDE 0.9 % IV SOLN
INTRAVENOUS | Status: DC
  Administered 2016-10-11: 10:00:00 via INTRAVENOUS

## 2016-10-11 MED ORDER — SODIUM CHLORIDE 0.9% FLUSH
10.0000 mL | INTRAVENOUS | Status: DC | PRN
  Administered 2016-10-11: 10 mL
  Filled 2016-10-11: qty 10

## 2016-10-11 MED ORDER — SODIUM CHLORIDE 0.9 % IV SOLN
Freq: Once | INTRAVENOUS | Status: AC
  Administered 2016-10-11: 10:00:00 via INTRAVENOUS

## 2016-10-11 MED ORDER — HEPARIN SOD (PORK) LOCK FLUSH 100 UNIT/ML IV SOLN
500.0000 [IU] | Freq: Once | INTRAVENOUS | Status: AC | PRN
  Administered 2016-10-11: 500 [IU]
  Filled 2016-10-11: qty 5

## 2016-10-11 MED ORDER — SODIUM CHLORIDE 0.9 % IV SOLN
Freq: Once | INTRAVENOUS | Status: AC
  Administered 2016-10-11: 13:00:00 via INTRAVENOUS
  Filled 2016-10-11: qty 5

## 2016-10-11 MED ORDER — LORAZEPAM 2 MG/ML IJ SOLN: 0.5000 mg | Freq: Once | INTRAMUSCULAR | Status: DC | PRN

## 2016-10-11 NOTE — Progress Notes (Signed)
OFFICE PROGRESS NOTE   October 11, 2016   Physicians: Anselm Jungling, MD(PCP), Ocie Cornfield, Renie Ora with [facility], Glenna Fellows), J.Kinard , Oretha Milch  INTERVAL HISTORY:  Patient is seen, alone for visit, in continuing attention to sensitizing CDDP being used with radiation for clinical IB2 adenocarcinoma of endocervix. PET 08-31-16 had 8 mm RUL lung nodule which will be followed with next scans. First CDDP was 09-27-16.  Peripheral IV access could not be obtained for treatment on 10-04-16.  Patient had PICC placed 10-05-16, then second CDDP on 10-06-16. She seems to be managing the PICC well, will have flushes twice weekly at [facility] and expect to DC this as soon as not needed after last chemo.  Patient had >=4 loose stools on 11-18, used imodium and drank fluids. I have explained that she can use up to 8 imodium / 24 hours, and that she should let MD know if this is not sufficient, as lomotil could be used if so. Bowels have not moved since 11-18 PM. She has slight nausea in AMs, uses zofran. She has been able to eat; she did not do all of oral prehydration so will have additional IVF prior to start of Rx today.  HA 11-13 resolved easily. No problems now from sites of attempted IV access on 10-04-16. No fever or symptoms of infection. No SOB or cough. No LE swelling. No bleeding. No bladder symptoms.  Remainder of 10 point Review of Systems negative.     No central catheter Flu vaccine 08-06-16 No genetics counseling (Note family history osteosarcoma, leukemia, colon ca)  ONCOLOGIC HISTORY Patient had intermittent vaginal spotting beginning ~ 04-2016. She had sonohystogram by Dr Matthew Saras 06-01-16 with 2.4 cm endometrial polyp identified. 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 She saw Dr Denman George in consultation, then had robotic assisted TAH BSO sentinel node on 08-05-16. Surgical pathology [id number]  invasive adenocarcinoma of endocervix with isolated tumor cells in right external iliac node and micromet in left external iliac node. Review of path confirms endocervical primary without synchronous endometrial carcinoma. PET 08-31-16 no obvious metastatic disease, 8 mm RUL lung nodule will be followed up on next scans. Recommendation is radiation with sensitizing chemotherapy. First CDDP given 09-27-16. Second CDDP delayed due to inadequate venous access, resumed 11-15 after PICC.   Objective:  Vital signs in last 24 hours:  129/85, 81, 18, 97.7, 99%, weight 142 which is down 2.5 lbs from 11-13. Alert, oriented and appropriate. Ambulatory with usual difficulty due to chronic back problems..  No alopecia  HEENT:PERRL, sclerae not icteric. Oral mucosa moist without lesions, posterior pharynx clear.  Neck supple. No JVD.  Lymphatics:no supraclavicular adenopathy Resp: clear to auscultation bilaterally  Cardio: regular rate and rhythm. No gallop. GI: soft, nontender, not distended, no mass or organomegaly. Few bowel sounds.  Musculoskeletal/ Extremities: LE without pitting edema, cords, tenderness Neuro: no increased peripheral neuropathy. Otherwise nonfocal. PSYCH appears more comfortable and relaxed today Skin without rash, ecchymosis, petechiae PICC dressing clean and intact, area surrounding without swelling, erythema, tenderness  Lab Results:  Results for orders placed or performed in visit on 10/11/16  CBC with Differential  Result Value Ref Range   WBC 5.9 3.9 - 10.3 10e3/uL   NEUT# 4.7 1.5 - 6.5 10e3/uL   HGB 12.7 11.6 - 15.9 g/dL   HCT 37.8 34.8 - 46.6 %   Platelets 122 (L) 145 - 400 10e3/uL   MCV 95.1 79.5 -  101.0 fL   MCH 31.8 25.1 - 34.0 pg   MCHC 33.5 31.5 - 36.0 g/dL   RBC 3.98 3.70 - 5.45 10e6/uL   RDW 12.4 11.2 - 14.5 %   lymph# 0.7 (L) 0.9 - 3.3 10e3/uL   MONO# 0.4 0.1 - 0.9 10e3/uL   Eosinophils Absolute 0.1 0.0 - 0.5 10e3/uL   Basophils Absolute 0.0 0.0 - 0.1  10e3/uL   NEUT% 78.8 (H) 38.4 - 76.8 %   LYMPH% 12.1 (L) 14.0 - 49.7 %   MONO% 6.5 0.0 - 14.0 %   EOS% 2.5 0.0 - 7.0 %   BASO% 0.1 0.0 - 2.0 %  Magnesium 1.6,  K 3.6, BUN 10.4, creat 0.8, rest OK  Studies/Results:  No results found.  Medications: I have reviewed the patient's current medications. Imodium as above. Note chronic constipation related to narcotics, so may do not want to give excessive antidiarrheals either May need zofran standing dose each AM thru radiation Magnesium increased to 4 gm from previous 3 gm in IVF today.  DISCUSSION Patient feels that she is tolerating treatment adequately and is in agreement with continuing as planned.  Meds as above, extra IVF today. Labs discussed  Discussed IV magnesium with [place] pharmacist, magnesium down from 2.0 with only a single CDDP treatment given. See above.  Referral to [facility] nutritionist  I did not talk with her about medical oncologist after first of year.  Assessment/Plan:  1. IB2 endocervical adenocarcinoma: Path reviewed, consensus that there is not a synchronous endometrial carcinoma. No obvious metastatic disease by PET 08-31-16. First sensitizing CDDP 09-27-16, planned weekly with radiation, weekly labs, MD appointments coordinated. 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 On chronic pain meds by pain management clinic. 3 PICC placed 10-05-16 . PICC flushes 2x weekly, dressing change weekly, coordinate with other schedule. This week flush 11-20 and 11-24 due to Thanksgiving holiday.  If she needs additional chemo beyond sensitizing CDDP, will need PAC. 4.flu vaccine 08-06-16 5.family history osteosarcoma in son, leukemia mother and sister, colon cancer . Consider genetics counseling. 6.post extractions of 10 teeth by Dr Enrique Sack 09-08-16, healing well. Still caries that need to be addressed by dentist of choice after chemo/ RT. 7. 8 mm RUL pulmonary nodule on PET/ CT, will need to follow up  with next scans. Question of nodular opacity left lung base by pre op CXR. Note pneumonia 2015 was RLL per xray reports. 8.overdue mammograms, last at Dr [facility] office ~ 2 years ago.  9.never colonoscopy 10.osteoporosis per patient, no DEXA in this EMR 11. constipation related to pain medication and recent surgery: increased bowel program helpful prior to start of chemoRT 12.magnesium decreasing with renal losses from CDDP: supplement and follow. K low normal today.    All questions answered and she knows to call if needed between appointments. Chemo orders confirmed with changes as noted. Time spent 25 min including >50% counseling and coordination of care. Route PCP, cc Dr Andree Elk   Evlyn Clines, MD   10/11/2016, 8:29 AM

## 2016-10-11 NOTE — Patient Instructions (Signed)
[facility] Discharge Instructions for Patients Receiving Chemotherapy  Today you received the following chemotherapy agents: Cisplatin   To help prevent nausea and vomiting after your treatment, we encourage you to take your nausea medication as directed.    If you develop nausea and vomiting that is not controlled by your nausea medication, call the clinic.   BELOW ARE SYMPTOMS THAT SHOULD BE REPORTED IMMEDIATELY:  *FEVER GREATER THAN 100.5 F  *CHILLS WITH OR WITHOUT FEVER  NAUSEA AND VOMITING THAT IS NOT CONTROLLED WITH YOUR NAUSEA MEDICATION  *UNUSUAL SHORTNESS OF BREATH  *UNUSUAL BRUISING OR BLEEDING  TENDERNESS IN MOUTH AND THROAT WITH OR WITHOUT PRESENCE OF ULCERS  *URINARY PROBLEMS  *BOWEL PROBLEMS  UNUSUAL RASH Items with * indicate a potential emergency and should be followed up as soon as possible.  Feel free to call the clinic you have any questions or concerns. The clinic phone number is [phone].  Please show the CHEMO ALERT CARD at check-in to the Emergency Department and triage nurse.   

## 2016-10-11 NOTE — Telephone Encounter (Signed)
Gave patient avs report and appointments for November and December  °

## 2016-10-12 ENCOUNTER — Ambulatory Visit
Admission: RE | Admit: 2016-10-12 | Discharge: 2016-10-12 | Disposition: A | Discharge status: Home / Self Care | Payer: Medicare Other | Source: Ambulatory Visit | Attending: Radiation Oncology | Admitting: Radiation Oncology

## 2016-10-12 ENCOUNTER — Encounter: Payer: Self-pay | Admitting: Radiation Oncology

## 2016-10-12 VITALS — BP 145/94 | HR 92 | Temp 97.9°F | Ht 62.0 in | Wt 142.4 lb

## 2016-10-12 DIAGNOSIS — G8929 Other chronic pain: Secondary | ICD-10-CM | POA: Diagnosis not present

## 2016-10-12 DIAGNOSIS — C530 Malignant neoplasm of endocervix: Secondary | ICD-10-CM | POA: Diagnosis not present

## 2016-10-12 DIAGNOSIS — M545 Low back pain: Secondary | ICD-10-CM | POA: Diagnosis not present

## 2016-10-12 DIAGNOSIS — Z90710 Acquired absence of both cervix and uterus: Secondary | ICD-10-CM | POA: Diagnosis not present

## 2016-10-12 DIAGNOSIS — Z510 Encounter for antineoplastic radiation therapy: Secondary | ICD-10-CM | POA: Diagnosis not present

## 2016-10-12 DIAGNOSIS — Z90722 Acquired absence of ovaries, bilateral: Secondary | ICD-10-CM | POA: Diagnosis not present

## 2016-10-12 DIAGNOSIS — C539 Malignant neoplasm of cervix uteri, unspecified: Secondary | ICD-10-CM

## 2016-10-12 NOTE — Progress Notes (Signed)
[name] has completed 15 fractions to her pelvis.  She denies having any pain in her pelvis area.  She reports having diarrhea at least 2 times per day.  She is taking Imodium which is helping.  She reports having nausea and is taking zofran and compazine.  She had chemotherapy yesterday.  She denies having bladder changes.  She reports her rectal area is irritated from the diarrhea.  She has been given a sitz bath and instructed on how to use it.  She reports having fatigue.  BP (!) 145/94 (BP Location: Left Arm, Patient Position: Sitting)   Pulse 92   Temp 97.9 F (36.6 C) (Oral)   Ht 5\' 2"  (1.575 m)   Wt 142 lb 6.4 oz (64.6 kg)   SpO2 100%   BMI 26.05 kg/m    Wt Readings from Last 3 Encounters:  10/12/16 142 lb 6.4 oz (64.6 kg)  10/11/16 142 lb 3.2 oz (64.5 kg)  10/05/16 142 lb 6.4 oz (64.6 kg)

## 2016-10-12 NOTE — Progress Notes (Signed)
  Radiation Oncology         [phone] ________________________________  Name: [name] MRN: [id number]  Date: 10/12/2016  DOB: [birth year]  Weekly Radiation Therapy Management    ICD-9-CM ICD-10-CM   1. Cervical cancer, FIGO stage IB2 (HCC) 180.9 C53.9      Current Dose: 27 Gy     Planned Dose:  45+ Gy  Narrative . . . . . . . . The patient presents for routine under treatment assessment.                                 The patient has completed 15 fractions to her pelvis. She denies having any pain in her pelvic area. She reports having diarrhea at least 2 times a day, and says that taking Imodium helps. She reports her rectal area is irritated from the diarrhea. The patient also reports having nausea, for which she is taking zofran and compazine. She had chemotherapy yesterday. The patient denies any bladder changes. She reports having fatigue.                                 Set-up films were reviewed.                                 The chart was checked. Physical Findings. . .  height is 5\' 2"  (1.575 m) and weight is 142 lb 6.4 oz (64.6 kg). Her oral temperature is 97.9 F (36.6 C). Her blood pressure is 145/94 (abnormal) and her pulse is 92. Her oxygen saturation is 100%. . Weight essentially stable.  Lungs are clear to auscultation bilaterally. Heart has regular rate and rhythm. No palpable cervical, supraclavicular, or axillary adenopathy. Abdomen soft, non-tender, normal bowel sounds. Impression . . . . . . . The patient is tolerating radiation. The patient is ambulatory with a cane. Plan . . . . . . . . . . . . Continue treatment as planned. I encouraged the patient to use a sitz bath and/or hemorrhoid cream to sooth her rectal irritation. We discussed prescribing Lomotil for the patient's diarrhea, but she feels that the Imodium is sufficient for now.  ________________________________   Blair Promise, PhD, MD  This document serves as a record of services  personally performed by Gery Pray, MD. It was created on his behalf by Maryla Morrow, a trained medical scribe. The creation of this record is based on the scribe's personal observations and the provider's statements to them. This document has been checked and approved by the attending provider.

## 2016-10-13 ENCOUNTER — Ambulatory Visit
Admission: RE | Admit: 2016-10-13 | Discharge: 2016-10-13 | Disposition: A | Discharge status: Home / Self Care | Payer: Medicare Other | Source: Ambulatory Visit | Attending: Radiation Oncology | Admitting: Radiation Oncology

## 2016-10-13 DIAGNOSIS — G8929 Other chronic pain: Secondary | ICD-10-CM | POA: Diagnosis not present

## 2016-10-13 DIAGNOSIS — Z90722 Acquired absence of ovaries, bilateral: Secondary | ICD-10-CM | POA: Diagnosis not present

## 2016-10-13 DIAGNOSIS — Z90710 Acquired absence of both cervix and uterus: Secondary | ICD-10-CM | POA: Diagnosis not present

## 2016-10-13 DIAGNOSIS — R109 Unspecified abdominal pain: Secondary | ICD-10-CM | POA: Diagnosis not present

## 2016-10-13 DIAGNOSIS — C530 Malignant neoplasm of endocervix: Secondary | ICD-10-CM | POA: Diagnosis not present

## 2016-10-13 DIAGNOSIS — M545 Low back pain: Secondary | ICD-10-CM | POA: Diagnosis not present

## 2016-10-13 DIAGNOSIS — Z452 Encounter for adjustment and management of vascular access device: Secondary | ICD-10-CM | POA: Insufficient documentation

## 2016-10-13 DIAGNOSIS — Z510 Encounter for antineoplastic radiation therapy: Secondary | ICD-10-CM | POA: Diagnosis not present

## 2016-10-13 DIAGNOSIS — Z79899 Other long term (current) drug therapy: Secondary | ICD-10-CM | POA: Diagnosis not present

## 2016-10-13 DIAGNOSIS — Z79891 Long term (current) use of opiate analgesic: Secondary | ICD-10-CM | POA: Diagnosis not present

## 2016-10-13 DIAGNOSIS — M47817 Spondylosis without myelopathy or radiculopathy, lumbosacral region: Secondary | ICD-10-CM | POA: Diagnosis not present

## 2016-10-13 DIAGNOSIS — M961 Postlaminectomy syndrome, not elsewhere classified: Secondary | ICD-10-CM | POA: Diagnosis not present

## 2016-10-13 DIAGNOSIS — G894 Chronic pain syndrome: Secondary | ICD-10-CM | POA: Diagnosis not present

## 2016-10-15 ENCOUNTER — Ambulatory Visit: Payer: Medicare Other

## 2016-10-17 ENCOUNTER — Other Ambulatory Visit: Payer: Self-pay | Admitting: Oncology

## 2016-10-18 ENCOUNTER — Ambulatory Visit: Payer: Medicare Other | Admitting: Nutrition

## 2016-10-18 ENCOUNTER — Encounter: Payer: Self-pay | Admitting: Oncology

## 2016-10-18 ENCOUNTER — Ambulatory Visit
Admission: RE | Admit: 2016-10-18 | Discharge: 2016-10-18 | Disposition: A | Discharge status: Home / Self Care | Payer: Medicare Other | Source: Ambulatory Visit | Attending: Radiation Oncology | Admitting: Radiation Oncology

## 2016-10-18 ENCOUNTER — Other Ambulatory Visit (HOSPITAL_BASED_OUTPATIENT_CLINIC_OR_DEPARTMENT_OTHER): Payer: Medicare Other

## 2016-10-18 ENCOUNTER — Ambulatory Visit (HOSPITAL_BASED_OUTPATIENT_CLINIC_OR_DEPARTMENT_OTHER): Payer: Medicare Other

## 2016-10-18 ENCOUNTER — Ambulatory Visit (HOSPITAL_BASED_OUTPATIENT_CLINIC_OR_DEPARTMENT_OTHER): Payer: Medicare Other | Admitting: Oncology

## 2016-10-18 ENCOUNTER — Encounter: Payer: Self-pay | Admitting: General Practice

## 2016-10-18 VITALS — BP 123/80 | HR 97 | Temp 98.2°F | Resp 16

## 2016-10-18 DIAGNOSIS — R197 Diarrhea, unspecified: Secondary | ICD-10-CM

## 2016-10-18 DIAGNOSIS — K520 Gastroenteritis and colitis due to radiation: Secondary | ICD-10-CM

## 2016-10-18 DIAGNOSIS — R110 Nausea: Secondary | ICD-10-CM

## 2016-10-18 DIAGNOSIS — G8929 Other chronic pain: Secondary | ICD-10-CM | POA: Diagnosis not present

## 2016-10-18 DIAGNOSIS — M810 Age-related osteoporosis without current pathological fracture: Secondary | ICD-10-CM

## 2016-10-18 DIAGNOSIS — R911 Solitary pulmonary nodule: Secondary | ICD-10-CM

## 2016-10-18 DIAGNOSIS — M545 Low back pain: Secondary | ICD-10-CM | POA: Diagnosis not present

## 2016-10-18 DIAGNOSIS — Z510 Encounter for antineoplastic radiation therapy: Secondary | ICD-10-CM | POA: Diagnosis not present

## 2016-10-18 DIAGNOSIS — Z452 Encounter for adjustment and management of vascular access device: Secondary | ICD-10-CM

## 2016-10-18 DIAGNOSIS — Z90722 Acquired absence of ovaries, bilateral: Secondary | ICD-10-CM | POA: Diagnosis not present

## 2016-10-18 DIAGNOSIS — T451X5A Adverse effect of antineoplastic and immunosuppressive drugs, initial encounter: Secondary | ICD-10-CM

## 2016-10-18 DIAGNOSIS — C539 Malignant neoplasm of cervix uteri, unspecified: Secondary | ICD-10-CM

## 2016-10-18 DIAGNOSIS — G8921 Chronic pain due to trauma: Secondary | ICD-10-CM | POA: Diagnosis not present

## 2016-10-18 DIAGNOSIS — C530 Malignant neoplasm of endocervix: Secondary | ICD-10-CM

## 2016-10-18 DIAGNOSIS — Z5111 Encounter for antineoplastic chemotherapy: Secondary | ICD-10-CM

## 2016-10-18 DIAGNOSIS — K5903 Drug induced constipation: Secondary | ICD-10-CM

## 2016-10-18 DIAGNOSIS — Z90710 Acquired absence of both cervix and uterus: Secondary | ICD-10-CM | POA: Diagnosis not present

## 2016-10-18 LAB — CBC WITH DIFFERENTIAL/PLATELET
BASO%: 0.9 % (ref 0.0–2.0)
BASOS ABS: 0 10*3/uL (ref 0.0–0.1)
EOS%: 0.4 % (ref 0.0–7.0)
Eosinophils Absolute: 0 10*3/uL (ref 0.0–0.5)
HCT: 32.9 % — ABNORMAL LOW (ref 34.8–46.6)
HGB: 11 g/dL — ABNORMAL LOW (ref 11.6–15.9)
LYMPH%: 17.5 % (ref 14.0–49.7)
MCH: 31.6 pg (ref 25.1–34.0)
MCHC: 33.5 g/dL (ref 31.5–36.0)
MCV: 94.2 fL (ref 79.5–101.0)
MONO#: 0.3 10*3/uL (ref 0.1–0.9)
MONO%: 10.5 % (ref 0.0–14.0)
NEUT#: 2.2 10*3/uL (ref 1.5–6.5)
NEUT%: 70.7 % (ref 38.4–76.8)
Platelets: 113 10*3/uL — ABNORMAL LOW (ref 145–400)
RBC: 3.49 10*6/uL — AB (ref 3.70–5.45)
RDW: 11.8 % (ref 11.2–14.5)
WBC: 3.1 10*3/uL — ABNORMAL LOW (ref 3.9–10.3)
lymph#: 0.5 10*3/uL — ABNORMAL LOW (ref 0.9–3.3)

## 2016-10-18 LAB — COMPREHENSIVE METABOLIC PANEL
ALT: 21 U/L (ref 0–55)
AST: 18 U/L (ref 5–34)
Albumin: 3.5 g/dL (ref 3.5–5.0)
Alkaline Phosphatase: 130 U/L (ref 40–150)
Anion Gap: 10 mEq/L (ref 3–11)
BUN: 15.3 mg/dL (ref 7.0–26.0)
CHLORIDE: 101 meq/L (ref 98–109)
CO2: 24 mEq/L (ref 22–29)
Calcium: 9.2 mg/dL (ref 8.4–10.4)
Creatinine: 0.9 mg/dL (ref 0.6–1.1)
EGFR: 71 mL/min/{1.73_m2} — AB (ref >90)
GLUCOSE: 114 mg/dL (ref 70–140)
POTASSIUM: 3.8 meq/L (ref 3.5–5.1)
SODIUM: 135 meq/L — AB (ref 136–145)
Total Bilirubin: 0.25 mg/dL (ref 0.20–1.20)
Total Protein: 6.6 g/dL (ref 6.4–8.3)

## 2016-10-18 LAB — MAGNESIUM: MAGNESIUM: 1.8 mg/dL (ref 1.5–2.5)

## 2016-10-18 MED ORDER — ONDANSETRON HCL 8 MG PO TABS
8.0000 mg | ORAL_TABLET | Freq: Once | ORAL | Status: AC
  Administered 2016-10-18: 8 mg via ORAL

## 2016-10-18 MED ORDER — POTASSIUM CHLORIDE 2 MEQ/ML IV SOLN
Freq: Once | INTRAVENOUS | Status: AC
  Administered 2016-10-18: 10:00:00 via INTRAVENOUS
  Filled 2016-10-18: qty 10

## 2016-10-18 MED ORDER — SODIUM CHLORIDE 0.9 % IV SOLN
Freq: Once | INTRAVENOUS | Status: AC
  Administered 2016-10-18: 10:00:00 via INTRAVENOUS

## 2016-10-18 MED ORDER — SODIUM CHLORIDE 0.9 % IV SOLN
40.0000 mg/m2 | Freq: Once | INTRAVENOUS | Status: AC
  Administered 2016-10-18: 68 mg via INTRAVENOUS
  Filled 2016-10-18: qty 68

## 2016-10-18 MED ORDER — SODIUM CHLORIDE 0.9 % IV SOLN
Freq: Once | INTRAVENOUS | Status: AC
  Administered 2016-10-18: 12:00:00 via INTRAVENOUS
  Filled 2016-10-18: qty 5

## 2016-10-18 MED ORDER — SODIUM CHLORIDE 0.9% FLUSH
10.0000 mL | INTRAVENOUS | Status: DC | PRN
  Administered 2016-10-18: 10 mL
  Filled 2016-10-18: qty 10

## 2016-10-18 MED ORDER — HEPARIN SOD (PORK) LOCK FLUSH 100 UNIT/ML IV SOLN
250.0000 [IU] | Freq: Once | INTRAVENOUS | Status: AC | PRN
  Administered 2016-10-18: 250 [IU]
  Filled 2016-10-18: qty 5

## 2016-10-18 MED ORDER — LORAZEPAM 2 MG/ML IJ SOLN: 0.5000 mg | Freq: Once | INTRAMUSCULAR | Status: DC | PRN

## 2016-10-18 MED ORDER — ONDANSETRON HCL 8 MG PO TABS
ORAL_TABLET | ORAL | Status: AC
  Filled 2016-10-18: qty 1

## 2016-10-18 NOTE — Progress Notes (Signed)
[facility] Spiritual Care Note  Followed up with [name] in infusion.  Introduced [name] and Support Team in more detail, providing opportunity for her to share and process coping tools and sources of meaning.    Per pt, breaking her back in a horseback riding accident 8 years ago dramatically changed her life.  Horses and the outdoors were big sources of meaning, comfort, and spiritual engagement for her.  Now she lives with chronic pain and decreased mobility, information she presented without bitterness.  Per pt:  "I really work my gratitude.  Some days I have to dig deep, but it's there."  We talked about "digging deep" for inward resources in coping with cancer and tx, as well.  [name] values her family (dtr and dtr's 69yo son live with her) and creative pursuits (Firefighter, Risk analyst, Social research officer, government) as significant sources of meaning and enjoyment.  [name] welcomed spiritual support and plans to reach out as desired, but please also page if immediate needs arise.  Thank you.   [place], [place], [facility] Pager [phone] Voicemail [phone]

## 2016-10-18 NOTE — Progress Notes (Signed)
OFFICE PROGRESS NOTE   October 18, 2016   Physicians: Anselm Jungling, MD(PCP), Ocie Cornfield, Renie Ora with [facility], Glenna Fellows), J.Kinard , Oretha Milch  INTERVAL HISTORY:  Patient is seen in infusion area, receiving week 4 sensitizing CDDP, this used with radiation for clinical IB2 adenocarcinoma of endocervix.  PET 08-31-16 had 8 mm RUL lung nodule, which will be followed on subsequent imaging  Patient continues to tolerate chemotherapy and radiation fairly well, IMRT planned thru 10-28-16, then for brachytherapy. She is managing radiation diarrhea with imodium, used x3 yesterday. She was more fatigued thru this past weekend, sleeping most of those days, but feels more energetic today. She did all of oral prehydration for chemotherapy today. She has had only slight nausea, controlled with just a couple of doses of prn zofran. Skin is not very irritated. She denies SOB or cough, fever, bladder symptoms, bleeding. She has had no problems with PICC. Chronic back pain is unchanged, heated recliner chairs in infusion area helpful.  Remainder of 10 point Review of Systems negative  PICC placed 10-05-16 Flu vaccine 08-06-16 No genetics counseling (Note family history osteosarcoma, leukemia, colon ca)  ONCOLOGIC HISTORY  Patient had intermittent vaginal spotting beginning ~ 04-2016. She had sonohystogram by Dr Matthew Saras 06-01-16 with 2.4 cm endometrial polyp identified. 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 She saw Dr Denman George in consultation, then had robotic assisted TAH BSO sentinel node on 08-05-16. Surgical pathology [id number] invasive adenocarcinoma of endocervix with isolated tumor cells in right external iliac node and micromet in left external iliac node. Review of path confirms endocervical primary without synchronous endometrial carcinoma. PET 08-31-16 no obvious metastatic disease, 8 mm RUL lung  nodule will be followed up on next scans. Recommendation is radiation with sensitizing chemotherapy. First CDDP given 09-27-16. Second CDDP delayed due to inadequate venous access, resumed 11-15 after PICC.  Objective:  Vital signs in last 24 hours:  Vitals per chemo flow sheets. Alert, oriented and appropriate. Ambulatory into office, comfortable in recliner in infusion area now. Respirations not labored.  No alopecia  HEENT:PERRL, sclerae not icteric. Oral mucosa moist without lesions,  Lymphatics:no supraclavicular adenopathy Resp: clear to auscultation bilaterally  Cardio: regular rate and rhythm. No gallop. GI: soft, nontender, not distended, no mass or organomegaly. Normally active bowel sounds.  Musculoskeletal/ Extremities:RUE, LE without pitting edema, cords, tenderness Neuro: no increase in chronic peripheral neuropathy LE. Otherwise nonfocal. PSYCH brighter mood and affect today Skin without rash, ecchymosis, petechiae PICC infusing without difficulty, site not remarkable  Lab Results:  Results for orders placed or performed in visit on 10/18/16  CBC with Differential  Result Value Ref Range   WBC 3.1 (L) 3.9 - 10.3 10e3/uL   NEUT# 2.2 1.5 - 6.5 10e3/uL   HGB 11.0 (L) 11.6 - 15.9 g/dL   HCT 32.9 (L) 34.8 - 46.6 %   Platelets 113 (L) 145 - 400 10e3/uL   MCV 94.2 79.5 - 101.0 fL   MCH 31.6 25.1 - 34.0 pg   MCHC 33.5 31.5 - 36.0 g/dL   RBC 3.49 (L) 3.70 - 5.45 10e6/uL   RDW 11.8 11.2 - 14.5 %   lymph# 0.5 (L) 0.9 - 3.3 10e3/uL   MONO# 0.3 0.1 - 0.9 10e3/uL   Eosinophils Absolute 0.0 0.0 - 0.5 10e3/uL   Basophils Absolute 0.0 0.0 - 0.1 10e3/uL   NEUT% 70.7 38.4 - 76.8 %   LYMPH% 17.5 14.0 - 49.7 %  MONO% 10.5 0.0 - 14.0 %   EOS% 0.4 0.0 - 7.0 %   BASO% 0.9 0.0 - 2.0 %  Comprehensive metabolic panel  Result Value Ref Range   Sodium 135 (L) 136 - 145 mEq/L   Potassium 3.8 3.5 - 5.1 mEq/L   Chloride 101 98 - 109 mEq/L   CO2 24 22 - 29 mEq/L   Glucose 114 70 - 140  mg/dl   BUN 15.3 7.0 - 26.0 mg/dL   Creatinine 0.9 0.6 - 1.1 mg/dL   Total Bilirubin 0.25 0.20 - 1.20 mg/dL   Alkaline Phosphatase 130 40 - 150 U/L   AST 18 5 - 34 U/L   ALT 21 0 - 55 U/L   Total Protein 6.6 6.4 - 8.3 g/dL   Albumin 3.5 3.5 - 5.0 g/dL   Calcium 9.2 8.4 - 10.4 mg/dL   Anion Gap 10 3 - 11 mEq/L   EGFR 71 (L) >90 ml/min/1.73 m2  Magnesium  Result Value Ref Range   Magnesium 1.8 1.5 - 2.5 mg/dl     Studies/Results:  No results found.  Medications: I have reviewed the patient's current medications. She is aware that she should let MD know if diarrhea not controlled with 8 imodium/ 24 hrs  DISCUSSION Interval history as above.  Message sent to radiation oncology asking if they need IV access for brachytherapy planned after completion of IMRT; if so, will keep PICC and continue regular flushes/ dressing changes longer than completion of chemo. Patient understands and agrees with keeping PICC if needed for those treatments.   Assessment/Plan:  1. IB2 endocervical adenocarcinoma: Path reviewed, consensusthat there is not a synchronous endometrial carcinoma. No obvious metastatic disease by PET 08-31-16. Sensitizing CDDP begun 09-27-16, cycle 4 today. She will have cycle 5 on 12-4 as long as ANC >=1.5 and plt >=100k and otherwise stable. IMRT to complete 10-28-16, then brachytherapy.  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 On chronic pain meds by pain management clinic. 3 PICC placed 10-05-16 . PICC flushes 2x weekly, dressing change weekly. If IV access needed for brachytherapy will keep in place for that, otherwise DC after chemo on 12-4 if stable, or at least on 12-7. If she needs additional chemo beyond sensitizing CDDP, will need PAC. 4.flu vaccine 08-06-16 5.family history osteosarcoma in son, leukemia mother and sister, colon cancer . Consider genetics counseling. 6.post extractions of 10 teeth by Dr Enrique Sack 09-08-16, healed. Still  caries that need to be addressed by dentist of choice after chemo/ RT. 7. 8 mm RUL pulmonary nodule on PET/ CT, will need to follow up with next scans. Question of nodular opacity left lung base by pre op CXR. Note pneumonia 2015 was RLL per xray reports. 8.overdue mammograms, last at Dr [facility] office ~ 2 years ago.  9.never colonoscopy 10.osteoporosis per patient, no DEXA in this EMR 11. constipation related to pain medication and recent surgery: increased bowel program helpful prior to start of chemoRT 12.magnesium followed with renal losses from CDDP: supplement IV due to radiation diarrhea.  All questions answered and patient knows to call if needed prior to next scheduled appointment. Message to rad onc and to collaborative RNs re PICC. Chemo orders confirmed. Time spent 25 min including >50% counseling and coordination of care. Route Dr Shelia Media, cc Dr Sondra Come   Evlyn Clines, MD   10/18/2016, 6:11 PM

## 2016-10-18 NOTE — Patient Instructions (Signed)
[facility] Discharge Instructions for Patients Receiving Chemotherapy  Today you received the following chemotherapy agents cisplatin.    To help prevent nausea and vomiting after your treatment, we encourage you to take your nausea medication as directed.     If you develop nausea and vomiting that is not controlled by your nausea medication, call the clinic.   BELOW ARE SYMPTOMS THAT SHOULD BE REPORTED IMMEDIATELY:  *FEVER GREATER THAN 100.5 F  *CHILLS WITH OR WITHOUT FEVER  NAUSEA AND VOMITING THAT IS NOT CONTROLLED WITH YOUR NAUSEA MEDICATION  *UNUSUAL SHORTNESS OF BREATH  *UNUSUAL BRUISING OR BLEEDING  TENDERNESS IN MOUTH AND THROAT WITH OR WITHOUT PRESENCE OF ULCERS  *URINARY PROBLEMS  *BOWEL PROBLEMS  UNUSUAL RASH Items with * indicate a potential emergency and should be followed up as soon as possible.  Feel free to call the clinic you have any questions or concerns. The clinic phone number is [phone].  

## 2016-10-18 NOTE — Progress Notes (Signed)
65 year old female diet diagnosed with cervical cancer.  She is a patient of Dr. Marko Plume.  She is receiving chemoradiation therapy.  Past medical history includes depression and borderline hypertension.  She is status post dental extractions in October.  Medications include Colace, Imodium, Ativan, Zofran, and MiraLAX.  Labs include albumin 3.5.  Height: 62 inches. Weight: 142.4 pounds November 21. Usual body weight: 145 pounds. BMI: 26.05.  Patient reports early satiety and diarrhea. Her nausea is controlled on Zofran, Compazine. She has fatigue. Denies other nutrition impact symptoms. Reports she tried ensure mixed with ice cream and liked it. Patient's weight is within normal range.  Nutrition diagnosis: Food and nutrition related knowledge deficit related to cervical cancer and associated treatments as evidenced by no prior need for nutrition related information.  Intervention: Encouraged patient to consume small frequent meals and snacks with adequate calories and protein to promote healing maintenance. Educated patient on strategies for eating to improve diarrhea. Encouraged patient to continue nausea medications as needed. Discussed strategies to improve fatigue. Encouraged patient to continue one ensure shake daily and provided coupons. Questions were answered.  Teach back method used.  Contact information given and fact sheets were provided.  Monitoring, evaluation, goals: Patient will tolerate adequate calories and protein to minimize weight loss throughout treatment.  Next visit: I will schedule as needed.  **Disclaimer: This note was dictated with voice recognition software. Similar sounding words can inadvertently be transcribed and this note may contain transcription errors which may not have been corrected upon publication of note.**

## 2016-10-19 ENCOUNTER — Encounter: Payer: Self-pay | Admitting: Oncology

## 2016-10-19 ENCOUNTER — Ambulatory Visit
Admission: RE | Admit: 2016-10-19 | Discharge: 2016-10-19 | Disposition: A | Discharge status: Home / Self Care | Payer: Medicare Other | Source: Ambulatory Visit | Attending: Radiation Oncology | Admitting: Radiation Oncology

## 2016-10-19 VITALS — BP 132/89 | HR 83 | Temp 98.4°F | Ht 62.0 in | Wt 139.0 lb

## 2016-10-19 DIAGNOSIS — M545 Low back pain: Secondary | ICD-10-CM | POA: Diagnosis not present

## 2016-10-19 DIAGNOSIS — C530 Malignant neoplasm of endocervix: Secondary | ICD-10-CM | POA: Diagnosis not present

## 2016-10-19 DIAGNOSIS — Z510 Encounter for antineoplastic radiation therapy: Secondary | ICD-10-CM | POA: Diagnosis not present

## 2016-10-19 DIAGNOSIS — Z90722 Acquired absence of ovaries, bilateral: Secondary | ICD-10-CM | POA: Diagnosis not present

## 2016-10-19 DIAGNOSIS — C539 Malignant neoplasm of cervix uteri, unspecified: Secondary | ICD-10-CM

## 2016-10-19 DIAGNOSIS — Z90710 Acquired absence of both cervix and uterus: Secondary | ICD-10-CM | POA: Diagnosis not present

## 2016-10-19 DIAGNOSIS — G8929 Other chronic pain: Secondary | ICD-10-CM | POA: Diagnosis not present

## 2016-10-19 NOTE — Progress Notes (Signed)
[name] has completed 18 fractions to her pelvis.  She denies having any bladder issues.  She reports that she has not had diarrhea for about 2 days.  She is taking Imodium before radiation.  She reports having fatigue.  She denies having vaginal bleeding.  She has lost 3 lbs since last week and reports having a poor appetite.  She is drinking 1 ensure per day.  She had chemotherapy yesterday.  BP 132/89 (BP Location: Left Arm, Patient Position: Sitting)   Pulse 83   Temp 98.4 F (36.9 C) (Oral)   Ht 5\' 2"  (1.575 m)   Wt 139 lb (63 kg)   SpO2 100%   BMI 25.42 kg/m    Wt Readings from Last 3 Encounters:  10/19/16 139 lb (63 kg)  10/12/16 142 lb 6.4 oz (64.6 kg)  10/11/16 142 lb 3.2 oz (64.5 kg)

## 2016-10-19 NOTE — Progress Notes (Signed)
Medical Oncology  Per radiation oncology: Ms. Trest will not need to keep the PICC line in for brachytherapy. She will be using the segmented cylinder and will not need IV access.   Will plan to DC PICC following last chemo.  Godfrey Pick, MD

## 2016-10-19 NOTE — Progress Notes (Signed)
  Radiation Oncology         [phone] ________________________________  Name: [name] MRN: [id number]  Date: 10/19/2016  DOB: Aug 19, 1951  Weekly Radiation Therapy Management    ICD-9-CM ICD-10-CM   1. Cervical cancer, FIGO stage IB2 (HCC) 180.9 C53.9      Current Dose: 32.4 Gy     Planned Dose:  45+ Gy  Narrative . . . . . . . . The patient presents for routine under treatment assessment.                                 The patient has completed 18 fractions to the pelvis. She reports fatigue. She denies bladder issues. She reports that she has not had diarrhea for approximately 2 days; she is taking Imodium before radiation. She denies any vaginal bleeding. The patient reports she has lost 3 lbs since last week and has had a poor appetite. She is drinking 1 Ensure a day. She had chemotherapy yesterday.                                  Set-up films were reviewed.                                 The chart was checked. Physical Findings. . .  height is 5\' 2"  (1.575 m) and weight is 139 lb (63 kg). Her oral temperature is 98.4 F (36.9 C). Her blood pressure is 132/89 and her pulse is 83. Her oxygen saturation is 100%. . Weight essentially stable.  Lungs are clear to auscultation bilaterally. Heart has regular rate and rhythm. No palpable cervical, supraclavicular, or axillary adenopathy. Abdomen soft, non-tender, normal bowel sounds. Impression . . . . . . . The patient is tolerating radiation. The patient is ambulatory with a cane. Plan . . . . . . . . . . . . Continue treatment as planned.  ________________________________   Blair Promise, PhD, MD  This document serves as a record of services personally performed by Gery Pray, MD. It was created on his behalf by Maryla Morrow, a trained medical scribe. The creation of this record is based on the scribe's personal observations and the provider's statements to them. This document has been checked and approved by the  attending provider.

## 2016-10-20 ENCOUNTER — Ambulatory Visit
Admission: RE | Admit: 2016-10-20 | Discharge: 2016-10-20 | Disposition: A | Discharge status: Home / Self Care | Payer: Medicare Other | Source: Ambulatory Visit | Attending: Radiation Oncology | Admitting: Radiation Oncology

## 2016-10-20 DIAGNOSIS — Z90710 Acquired absence of both cervix and uterus: Secondary | ICD-10-CM | POA: Diagnosis not present

## 2016-10-20 DIAGNOSIS — Z90722 Acquired absence of ovaries, bilateral: Secondary | ICD-10-CM | POA: Diagnosis not present

## 2016-10-20 DIAGNOSIS — C530 Malignant neoplasm of endocervix: Secondary | ICD-10-CM | POA: Diagnosis not present

## 2016-10-20 DIAGNOSIS — M545 Low back pain: Secondary | ICD-10-CM | POA: Diagnosis not present

## 2016-10-20 DIAGNOSIS — Z510 Encounter for antineoplastic radiation therapy: Secondary | ICD-10-CM | POA: Diagnosis not present

## 2016-10-20 DIAGNOSIS — G8929 Other chronic pain: Secondary | ICD-10-CM | POA: Diagnosis not present

## 2016-10-21 ENCOUNTER — Ambulatory Visit
Admission: RE | Admit: 2016-10-21 | Discharge: 2016-10-21 | Disposition: A | Discharge status: Home / Self Care | Payer: Medicare Other | Source: Ambulatory Visit | Attending: Radiation Oncology | Admitting: Radiation Oncology

## 2016-10-21 ENCOUNTER — Ambulatory Visit (HOSPITAL_BASED_OUTPATIENT_CLINIC_OR_DEPARTMENT_OTHER): Payer: Medicare Other

## 2016-10-21 DIAGNOSIS — Z90710 Acquired absence of both cervix and uterus: Secondary | ICD-10-CM | POA: Diagnosis not present

## 2016-10-21 DIAGNOSIS — G8929 Other chronic pain: Secondary | ICD-10-CM | POA: Diagnosis not present

## 2016-10-21 DIAGNOSIS — Z90722 Acquired absence of ovaries, bilateral: Secondary | ICD-10-CM | POA: Diagnosis not present

## 2016-10-21 DIAGNOSIS — Z452 Encounter for adjustment and management of vascular access device: Secondary | ICD-10-CM

## 2016-10-21 DIAGNOSIS — C530 Malignant neoplasm of endocervix: Secondary | ICD-10-CM

## 2016-10-21 DIAGNOSIS — Z510 Encounter for antineoplastic radiation therapy: Secondary | ICD-10-CM | POA: Diagnosis not present

## 2016-10-21 DIAGNOSIS — M545 Low back pain: Secondary | ICD-10-CM | POA: Diagnosis not present

## 2016-10-21 MED ORDER — HEPARIN SOD (PORK) LOCK FLUSH 100 UNIT/ML IV SOLN
250.0000 [IU] | Freq: Once | INTRAVENOUS | Status: AC | PRN
  Administered 2016-10-21: 16:00:00
  Filled 2016-10-21: qty 5

## 2016-10-21 MED ORDER — SODIUM CHLORIDE 0.9% FLUSH
10.0000 mL | INTRAVENOUS | Status: DC | PRN
  Administered 2016-10-21: 10 mL
  Filled 2016-10-21: qty 10

## 2016-10-22 ENCOUNTER — Ambulatory Visit
Admission: RE | Admit: 2016-10-22 | Discharge: 2016-10-22 | Disposition: A | Discharge status: Home / Self Care | Payer: Medicare Other | Source: Ambulatory Visit | Attending: Radiation Oncology | Admitting: Radiation Oncology

## 2016-10-22 DIAGNOSIS — M545 Low back pain: Secondary | ICD-10-CM | POA: Diagnosis not present

## 2016-10-22 DIAGNOSIS — Z90710 Acquired absence of both cervix and uterus: Secondary | ICD-10-CM | POA: Diagnosis not present

## 2016-10-22 DIAGNOSIS — Z90722 Acquired absence of ovaries, bilateral: Secondary | ICD-10-CM | POA: Diagnosis not present

## 2016-10-22 DIAGNOSIS — Z510 Encounter for antineoplastic radiation therapy: Secondary | ICD-10-CM | POA: Diagnosis not present

## 2016-10-22 DIAGNOSIS — C530 Malignant neoplasm of endocervix: Secondary | ICD-10-CM | POA: Diagnosis not present

## 2016-10-22 DIAGNOSIS — G8929 Other chronic pain: Secondary | ICD-10-CM | POA: Diagnosis not present

## 2016-10-24 ENCOUNTER — Other Ambulatory Visit: Payer: Self-pay | Admitting: Oncology

## 2016-10-25 ENCOUNTER — Encounter: Payer: Self-pay | Admitting: Oncology

## 2016-10-25 ENCOUNTER — Ambulatory Visit (HOSPITAL_BASED_OUTPATIENT_CLINIC_OR_DEPARTMENT_OTHER): Payer: Medicare Other | Admitting: Oncology

## 2016-10-25 ENCOUNTER — Telehealth: Payer: Self-pay

## 2016-10-25 ENCOUNTER — Other Ambulatory Visit: Payer: Self-pay | Admitting: Oncology

## 2016-10-25 ENCOUNTER — Ambulatory Visit (HOSPITAL_BASED_OUTPATIENT_CLINIC_OR_DEPARTMENT_OTHER): Payer: Medicare Other

## 2016-10-25 ENCOUNTER — Other Ambulatory Visit (HOSPITAL_BASED_OUTPATIENT_CLINIC_OR_DEPARTMENT_OTHER): Payer: Medicare Other

## 2016-10-25 ENCOUNTER — Ambulatory Visit
Admission: RE | Admit: 2016-10-25 | Discharge: 2016-10-25 | Disposition: A | Discharge status: Home / Self Care | Payer: Medicare Other | Source: Ambulatory Visit | Attending: Radiation Oncology | Admitting: Radiation Oncology

## 2016-10-25 ENCOUNTER — Telehealth: Payer: Self-pay | Admitting: Oncology

## 2016-10-25 VITALS — BP 114/74 | HR 90 | Temp 97.9°F

## 2016-10-25 DIAGNOSIS — C539 Malignant neoplasm of cervix uteri, unspecified: Secondary | ICD-10-CM

## 2016-10-25 DIAGNOSIS — R911 Solitary pulmonary nodule: Secondary | ICD-10-CM

## 2016-10-25 DIAGNOSIS — M545 Low back pain: Secondary | ICD-10-CM | POA: Diagnosis not present

## 2016-10-25 DIAGNOSIS — Z90722 Acquired absence of ovaries, bilateral: Secondary | ICD-10-CM | POA: Diagnosis not present

## 2016-10-25 DIAGNOSIS — G8921 Chronic pain due to trauma: Secondary | ICD-10-CM | POA: Diagnosis not present

## 2016-10-25 DIAGNOSIS — T451X5A Adverse effect of antineoplastic and immunosuppressive drugs, initial encounter: Secondary | ICD-10-CM

## 2016-10-25 DIAGNOSIS — Z5189 Encounter for other specified aftercare: Secondary | ICD-10-CM

## 2016-10-25 DIAGNOSIS — Z452 Encounter for adjustment and management of vascular access device: Secondary | ICD-10-CM

## 2016-10-25 DIAGNOSIS — C530 Malignant neoplasm of endocervix: Secondary | ICD-10-CM

## 2016-10-25 DIAGNOSIS — D6481 Anemia due to antineoplastic chemotherapy: Secondary | ICD-10-CM

## 2016-10-25 DIAGNOSIS — Z90710 Acquired absence of both cervix and uterus: Secondary | ICD-10-CM | POA: Diagnosis not present

## 2016-10-25 DIAGNOSIS — Z510 Encounter for antineoplastic radiation therapy: Secondary | ICD-10-CM | POA: Diagnosis not present

## 2016-10-25 DIAGNOSIS — K520 Gastroenteritis and colitis due to radiation: Secondary | ICD-10-CM

## 2016-10-25 DIAGNOSIS — G8929 Other chronic pain: Secondary | ICD-10-CM | POA: Diagnosis not present

## 2016-10-25 DIAGNOSIS — D701 Agranulocytosis secondary to cancer chemotherapy: Secondary | ICD-10-CM

## 2016-10-25 DIAGNOSIS — D6959 Other secondary thrombocytopenia: Secondary | ICD-10-CM

## 2016-10-25 LAB — COMPREHENSIVE METABOLIC PANEL
ALBUMIN: 3.3 g/dL — AB (ref 3.5–5.0)
ALK PHOS: 117 U/L (ref 40–150)
ALT: 15 U/L (ref 0–55)
ANION GAP: 13 meq/L — AB (ref 3–11)
AST: 15 U/L (ref 5–34)
BILIRUBIN TOTAL: 0.32 mg/dL (ref 0.20–1.20)
BUN: 10.2 mg/dL (ref 7.0–26.0)
CO2: 23 mEq/L (ref 22–29)
Calcium: 8.9 mg/dL (ref 8.4–10.4)
Chloride: 102 mEq/L (ref 98–109)
Creatinine: 0.8 mg/dL (ref 0.6–1.1)
EGFR: 74 mL/min/{1.73_m2} — AB (ref >90)
Glucose: 108 mg/dl (ref 70–140)
Potassium: 3.7 mEq/L (ref 3.5–5.1)
Sodium: 138 mEq/L (ref 136–145)
TOTAL PROTEIN: 6.3 g/dL — AB (ref 6.4–8.3)

## 2016-10-25 LAB — CBC WITH DIFFERENTIAL/PLATELET
BASO%: 0 % (ref 0.0–2.0)
BASOS ABS: 0 10*3/uL (ref 0.0–0.1)
EOS ABS: 0 10*3/uL (ref 0.0–0.5)
EOS%: 0 % (ref 0.0–7.0)
HCT: 27.4 % — ABNORMAL LOW (ref 34.8–46.6)
HEMOGLOBIN: 9.5 g/dL — AB (ref 11.6–15.9)
LYMPH%: 31.7 % (ref 14.0–49.7)
MCH: 31.7 pg (ref 25.1–34.0)
MCHC: 34.7 g/dL (ref 31.5–36.0)
MCV: 91.3 fL (ref 79.5–101.0)
MONO#: 0.2 10*3/uL (ref 0.1–0.9)
MONO%: 10.4 % (ref 0.0–14.0)
NEUT%: 57.9 % (ref 38.4–76.8)
NEUTROS ABS: 1 10*3/uL — AB (ref 1.5–6.5)
PLATELETS: 39 10*3/uL — AB (ref 145–400)
RBC: 3 10*6/uL — AB (ref 3.70–5.45)
RDW: 12.1 % (ref 11.2–14.5)
WBC: 1.6 10*3/uL — AB (ref 3.9–10.3)
lymph#: 0.5 10*3/uL — ABNORMAL LOW (ref 0.9–3.3)

## 2016-10-25 LAB — MAGNESIUM: Magnesium: 1.3 mg/dl — CL (ref 1.5–2.5)

## 2016-10-25 MED ORDER — ONDANSETRON HCL 4 MG/2ML IJ SOLN
INTRAMUSCULAR | Status: AC
  Filled 2016-10-25: qty 4

## 2016-10-25 MED ORDER — POTASSIUM CHLORIDE 2 MEQ/ML IV SOLN
Freq: Once | INTRAVENOUS | Status: AC
  Administered 2016-10-25: 11:00:00 via INTRAVENOUS
  Filled 2016-10-25: qty 10

## 2016-10-25 MED ORDER — SODIUM CHLORIDE 0.9% FLUSH
10.0000 mL | INTRAVENOUS | Status: DC | PRN
  Administered 2016-10-25: 10 mL
  Filled 2016-10-25: qty 10

## 2016-10-25 MED ORDER — ONDANSETRON HCL 8 MG PO TABS: 8.0000 mg | ORAL_TABLET | Freq: Three times a day (TID) | ORAL | 0 refills | Status: DC | PRN

## 2016-10-25 MED ORDER — HEPARIN SOD (PORK) LOCK FLUSH 100 UNIT/ML IV SOLN
500.0000 [IU] | Freq: Once | INTRAVENOUS | Status: AC | PRN
  Administered 2016-10-25: 500 [IU]
  Filled 2016-10-25: qty 5

## 2016-10-25 MED ORDER — TBO-FILGRASTIM 300 MCG/0.5ML ~~LOC~~ SOSY
300.0000 ug | PREFILLED_SYRINGE | Freq: Once | SUBCUTANEOUS | Status: AC
  Administered 2016-10-25: 300 ug via SUBCUTANEOUS
  Filled 2016-10-25: qty 0.5

## 2016-10-25 MED ORDER — SODIUM CHLORIDE 0.9 % IV SOLN: Freq: Once | INTRAVENOUS | Status: DC

## 2016-10-25 MED ORDER — ONDANSETRON HCL 4 MG/2ML IJ SOLN
8.0000 mg | Freq: Once | INTRAMUSCULAR | Status: AC
  Administered 2016-10-25: 8 mg via INTRAVENOUS

## 2016-10-25 MED ORDER — ONDANSETRON HCL 40 MG/20ML IJ SOLN: Freq: Once | INTRAMUSCULAR | Status: DC

## 2016-10-25 NOTE — Telephone Encounter (Signed)
-----   Message from Gordy Levan, MD sent at 10/25/2016 12:32 PM EST ----- zofran refill please

## 2016-10-25 NOTE — Patient Instructions (Signed)
Thrombocytopenia Thrombocytopenia is a condition in which you have an abnormally decreased number of platelets in your blood. Platelets are also called thrombocytes. Platelets are needed for blood clotting. Some cases of thrombocytopenia are mild while others are more severe. What are the causes? This condition may be caused by:  Decreased production of platelets. This can be caused by:  Aplastic anemia, in which your bone marrow quits making blood cells.  Cancer in the bone marrow.  Use of certain medicines, including chemotherapy.  Infection in the bone marrow.  Heavy alcohol consumption.  Increased destruction of platelets. This can be caused by:  Certain immune diseases.  Use of certain drugs.  Certain blood clotting disorders.  Certain inherited disorders.  Certain bleeding disorders.  Pregnancy.  Having an enlarged spleen (hypersplenism). In hypersplenism, the spleen gathers up platelets from circulation. This means that the platelets are not available to help with blood clotting. The spleen can be enlarged because of cirrhosis or other conditions. What are the signs or symptoms? Symptoms of this condition are side effects of poor blood clotting. They will vary depending on how low the platelet counts are. Symptoms may include:  Abnormal bleeding.  Nosebleeds.  Heavy menstrual periods.  Blood in the urine or stool (feces).  A purplish discoloration in the skin (purpura).  Bruising.  A rash that looks like pinpoint, purplish-red spots (petechiae) on the skin and mucous membranes. How is this diagnosed? This condition may be diagnosed with blood tests and a physical exam. Sometimes, a sample of bone marrow may be removed to look for the original cells (megakaryocytes) that make platelets. How is this treated? Treatment for this condition depends on the cause. Treatment options may include:  Treatment of another condition that is causing the low platelet  count.  Medicines to help protect your platelets from being destroyed.  A replacement (transfusion) of platelets to stop or prevent bleeding.  Surgery to remove the spleen. Follow these instructions at home: General instructions  Check your skin and the linings inside your mouth for bruising or bleeding as told by your health care provider.  Check your sputum, urine, and stool for blood as told by your health care provider.  Ask your health care provider if it is okay for you to drink alcohol.  Take over-the-counter and prescription medicines only as told by your health care provider.  Tell all of your health care providers, including dentists and eye doctors, about your condition. Activity  Until your health care provider says it is okay.  Do not return to any activities that could cause bumps or bruises.  Take extra care not to cut yourself when you shave or when you use scissors, needles, knives, and other tools.  Take extra care not to burn yourself when ironing or cooking. Contact a health care provider if:  You have unexplained bruising. Get help right away if:  You have active bleeding from anywhere on your body.  You have blood in your sputum, urine, or stool. This information is not intended to replace advice given to you by your health care provider. Make sure you discuss any questions you have with your health care provider. Document Released: 11/08/2005 Document Revised: 07/11/2016 Document Reviewed: 05/12/2015 Elsevier Interactive Patient Education  2017 [employer]  Leukopenia Leukopenia is a condition in which you have a low number of white blood cells. White blood cells help your body fight infections. The number of white blood cells in the body varies from person to person.  Leukopenia is usually defined as having fewer than 4,000 white blood cells in 1 microliter of blood. There are five types of white blood cells. Two types make up most of your white  blood cell count. These are neutrophils and lymphocytes. When your level of neutrophils is low, it is called neutropenia. When your lymphocytes are low, it is called lymphocytopenia. Neutropenia is the most dangerous type of leukopenia because it can lead to dangerous infections. CAUSES  Most white blood cells are made in the soft tissue inside your bones (bone marrow). Conditions that damage or suppress bone marrow are the most common causes of leukopenia. These include:  Medicine or X-ray treatments for cancer.  Serious infections.  Cancer of the white blood cells (leukemia or myeloma).  Medicines, including antibiotics, cardiac drugs, steroids, and those used to treat rheumatoid arthritis. Leukopenia also happens when white blood cells are destroyed after leaving your bone marrow. Causes may include:  Liver disease.  Diseases of the immune system (autoimmune disease).  Vitamin B deficiencies. SIGNS AND SYMPTOMS One of the most common signs of leukopenia, especially severe neutropenia, is having a lot of bacterial infections. Different infections have different symptoms. An infection in your lungs may cause coughing. A urinary tract infection may cause frequent urination and a burning sensation. You may also get infections of the blood, skin, rectum, throat, sinus, or ear. General signs and symptoms of leukopenia include:  Fever.  Fatigue.  Swollen glands (lymph nodes).  Painful mouth ulcers.  Gum disease. DIAGNOSIS  Your health care provider can diagnose leukopenia based on a physical exam and the results of lab tests. During a physical exam, your health care provider will feel for swollen lymph nodes and check whether your spleen is enlarged. Your spleen is an organ on the left side of your body that stores white blood cells. Tests that may be done include:  A complete blood count. This blood test counts each type of white cell.  Bone marrow aspiration. Some bone marrow is  removed to be checked under a microscope.  Lymph node biopsy. Some lymph node tissue is removed to be checked under a microscope.  Other types of blood tests or imaging tests. TREATMENT  Treatment of leukopenia depends on the cause. Some common treatments include:  Antibiotics for bacterial infections.  No longer taking medicines that may cause leukopenia.  Vitamin B supplements.  Medicines to stimulate neutrophil production (hematopoietic growth factors) for neutropenia. HOME CARE INSTRUCTIONS  Preventing infection is important if you have leukopenia.  Avoid sick friends and family members.  Wash your hands often.  Do noteat uncooked or undercooked meats.  Wash fruits and vegetables.  Do not eat or drink unpasteurized dairy products.  Get regular dental care, and maintain good dental hygiene.  Keep all follow-up appointments. SEEK MEDICAL CARE IF:  You have chills or a fever.  You have signs or symptoms of infection. SEEK IMMEDIATE MEDICAL CARE IF:  You have a fever or persistent symptoms for more than 2-3 days.  You have trouble breathing.  You have chest pain. MAKE SURE YOU:  Understand these instructions.  Will watch your condition.  Will get help right away if you are not doing well or get worse. This information is not intended to replace advice given to you by your health care provider. Make sure you discuss any questions you have with your health care provider. Document Released: 11/13/2013 Document Reviewed: 11/13/2013 Elsevier Interactive Patient Education  2017 [employer].

## 2016-10-25 NOTE — Progress Notes (Signed)
OFFICE PROGRESS NOTE   October 25, 2016   Physicians: Anselm Jungling, MD(PCP), Ocie Cornfield, Renie Ora with [facility], Glenna Fellows), J.Kinard , Oretha Milch  INTERVAL HISTORY:  Patient is seen in infusion area, counts too low today for planned week 5 sensitizing CDDP with radiation for clinical IB2 adenocarcinoma of endocervix. IMRT is scheduled thru 10-28-16, then HDR 12-11, 12-18 and 12-20.   PET 08-31-16 had 8 mm RUL lung nodule, which will be followed on subsequent imaging.  Patient was fatigued thru this weekend, but has had no fever or symptoms of infection and no bleeding or bruising. She needed 2 imodium on each 12-2 and 12-3 for apparent radiation diarrhea. Appetite is decreased, more nausea but controlled with prn zofran, is drinking fluids. No problems with PICC, no SOB, no new or different pain.  Remainder of 10 point Review of Systems negative.   PICC placed 10-05-16 Flu vaccine 08-06-16 No genetics counseling (Note family history osteosarcoma, leukemia, colon ca)    ONCOLOGIC HISTORY Patient had intermittent vaginal spotting beginning ~ 04-2016. She had sonohystogram by Dr Matthew Saras 06-01-16 with 2.4 cm endometrial polyp identified. 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 She saw Dr Denman George in consultation, then had robotic assisted TAH BSO sentinel node on 08-05-16. Surgical pathology [id number] invasive adenocarcinoma of endocervix with isolated tumor cells in right external iliac node and micromet in left external iliac node. Review of path confirms endocervical primary without synchronous endometrial carcinoma. PET 08-31-16 no obvious metastatic disease, 8 mm RUL lung nodule will be followed up on next scans. Recommendation is radiation with sensitizing chemotherapy. First CDDP given 09-27-16. Second CDDP delayed due to inadequate venous access, resumed 11-15 after  PICC.  Objective:  Vital signs in last 24 hours: 131/90, 97.9, 100%, respirations 18 not labored RA, HR 90  Alert, oriented and appropriate, appears comfortable.  Ambulatory into office without assistance.  HEENT:PERRL, sclerae not icteric. Oral mucosa moist without lesions. Resp: clear to auscultation bilaterally, respirations not labored  Cardio: regular rate and rhythm. No gallop. GI: soft, nontender, not distended, no mass or organomegaly. Normally active bowel sounds.  Musculoskeletal/ Extremities: UE/ LE without pitting edema, cords, tenderness Neuro: no peripheral neuropathy. Otherwise nonfocal. PSYCH appropriate mood and affect Skin without rash, ecchymosis, petechiae PICC site unremarkable, infusing easily now. No swelling RUE.  Lab Results:  Results for orders placed or performed in visit on 10/25/16  CBC with Differential  Result Value Ref Range   WBC 1.6 (L) 3.9 - 10.3 10e3/uL   NEUT# 1.0 (L) 1.5 - 6.5 10e3/uL   HGB 9.5 (L) 11.6 - 15.9 g/dL   HCT 27.4 (L) 34.8 - 46.6 %   Platelets 39 (L) 145 - 400 10e3/uL   MCV 91.3 79.5 - 101.0 fL   MCH 31.7 25.1 - 34.0 pg   MCHC 34.7 31.5 - 36.0 g/dL   RBC 3.00 (L) 3.70 - 5.45 10e6/uL   RDW 12.1 11.2 - 14.5 %   lymph# 0.5 (L) 0.9 - 3.3 10e3/uL   MONO# 0.2 0.1 - 0.9 10e3/uL   Eosinophils Absolute 0.0 0.0 - 0.5 10e3/uL   Basophils Absolute 0.0 0.0 - 0.1 10e3/uL   NEUT% 57.9 38.4 - 76.8 %   LYMPH% 31.7 14.0 - 49.7 %   MONO% 10.4 0.0 - 14.0 %   EOS% 0.0 0.0 - 7.0 %   BASO% 0.0 0.0 - 2.0 %  Comprehensive metabolic panel  Result Value Ref  Range   Sodium 138 136 - 145 mEq/L   Potassium 3.7 3.5 - 5.1 mEq/L   Chloride 102 98 - 109 mEq/L   CO2 23 22 - 29 mEq/L   Glucose 108 70 - 140 mg/dl   BUN 10.2 7.0 - 26.0 mg/dL   Creatinine 0.8 0.6 - 1.1 mg/dL   Total Bilirubin 0.32 0.20 - 1.20 mg/dL   Alkaline Phosphatase 117 40 - 150 U/L   AST 15 5 - 34 U/L   ALT 15 0 - 55 U/L   Total Protein 6.3 (L) 6.4 - 8.3 g/dL   Albumin 3.3 (L)  3.5 - 5.0 g/dL   Calcium 8.9 8.4 - 10.4 mg/dL   Anion Gap 13 (H) 3 - 11 mEq/L   EGFR 74 (L) >90 ml/min/1.73 m2  Magnesium  Result Value Ref Range   Magnesium 1.3 (LL) 1.5 - 2.5 mg/dl    Reviewed CBC and chemistries with patient now.   Studies/Results:  No results found. She will need CT chest with restaging scans.  Medications: I have reviewed the patient's current medications. Per managed care, ok for granix, given 300 mg today.  DISCUSSION Instructed on neutropenic precautions and possible aches from gCSF. She knows to call if fever or symptoms of infection, or significant bleeding. I have left voice message + EMR message for radiation oncology re counts today, in case radiation scheduling needs to adjust.  Will give IV magnesium with 20 KCL today. Have not added po magnesium with radiation diarrhea. Will recheck counts and chemistries in 2-3 days. WIll not pull PICC today with platelets 39k, discussed with RN.    Scheduling message and instructions to collaborative RNs as follows: CBC BMET MG on 12-6 from PICC.  Possible granix: give 300 mg if ANC <=1.0  PICC DC on 12-6  if not neutropenic, if platelets over ~ 80K, if otherwise stable.   Assessment/Plan:  1. IB2 endocervical adenocarcinoma: Path reviewed, consensusthat there is not a synchronous endometrial carcinoma. No obvious metastatic disease by PET 08-31-16. Sensitizing CDDP given x 4 rrom  09-27-16 thru 10-18-16, cycle 5 not given with neutropenia and thrombocytopenia today.  IMRT to complete ~ 10-28-16, then brachytherapy.  Follow up labs and PICC this week. I will see her with labs on 12-18.  2. Neutropenia, thrombocytopenia and anemia related to chemo and pelvic radiation:  Not febrile, not bleeding, not symptomatic from anemia at rest. Granix now, bleeding and neutropenic precautions, repeat counts later this week.  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 K still ok tho a little lower and with diarrhea will  give 20 mEq also IV now. Repeat chemistries with labs later this week. 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 On chronic pain meds by pain management clinic. 5.PICC placed 10-05-16, will DC later this week as long as counts improved adequately. If she needs additional chemo beyond sensitizing CDDP, willneed PAC. 6.family history osteosarcoma in son, leukemia mother and sister, colon cancer . Consider genetics counseling. 7.post extractions of 10 teeth by Dr Enrique Sack 09-08-16, healed. Still caries that need to be addressed by dentist of choice after chemo/ RT. 8. 8 mm RUL pulmonary nodule on PET/ CT, will need to follow up with next scans. Question of nodular opacity left lung base by pre op CXR. Note pneumonia 2015 was RLL per xray reports. 9.overdue mammograms, last at Dr [facility] [place] office ~ 2 years ago.  10.never colonoscopy 11.osteoporosis per patient, no DEXA in this EMR 12.flu vaccine 08-06-16  All questions answered. IVF as ordered, no chemo. Granix discussed with managed care and ordered. Communication with rad onc and Teacher, music. Time spent 30 min including >50% counseling and coordination of care. Route Dr Shelia Media, cc Drs Sondra Come and Leone Payor, MD   10/25/2016, 12:29 PM

## 2016-10-25 NOTE — Telephone Encounter (Addendum)
Called [name] and advised her that Dr. Sondra Come has ordered labs for 2:00 tomorrow before treatment to check her neutrophil and platelet count.  [name] verbalized understanding and agreement.  Also left a message for Grace Pulse, RN and Grace Share, RN stating that [name] will have labs drawn tomorrow before radiation treatment.

## 2016-10-26 ENCOUNTER — Ambulatory Visit
Admission: RE | Admit: 2016-10-26 | Discharge: 2016-10-26 | Disposition: A | Discharge status: Home / Self Care | Payer: Medicare Other | Source: Ambulatory Visit | Attending: Radiation Oncology | Admitting: Radiation Oncology

## 2016-10-26 ENCOUNTER — Telehealth: Payer: Self-pay

## 2016-10-26 ENCOUNTER — Encounter: Payer: Self-pay | Admitting: Radiation Oncology

## 2016-10-26 ENCOUNTER — Ambulatory Visit (HOSPITAL_BASED_OUTPATIENT_CLINIC_OR_DEPARTMENT_OTHER)
Admission: RE | Admit: 2016-10-26 | Discharge: 2016-10-26 | Disposition: A | Discharge status: Home / Self Care | Payer: Medicare Other | Source: Ambulatory Visit | Attending: Radiation Oncology | Admitting: Radiation Oncology

## 2016-10-26 ENCOUNTER — Telehealth: Payer: Self-pay | Admitting: Oncology

## 2016-10-26 VITALS — BP 121/82 | HR 105 | Temp 98.0°F | Ht 62.0 in | Wt 142.6 lb

## 2016-10-26 DIAGNOSIS — D6481 Anemia due to antineoplastic chemotherapy: Secondary | ICD-10-CM

## 2016-10-26 DIAGNOSIS — M545 Low back pain: Secondary | ICD-10-CM | POA: Diagnosis not present

## 2016-10-26 DIAGNOSIS — D6959 Other secondary thrombocytopenia: Secondary | ICD-10-CM | POA: Insufficient documentation

## 2016-10-26 DIAGNOSIS — C539 Malignant neoplasm of cervix uteri, unspecified: Secondary | ICD-10-CM

## 2016-10-26 DIAGNOSIS — D701 Agranulocytosis secondary to cancer chemotherapy: Secondary | ICD-10-CM | POA: Insufficient documentation

## 2016-10-26 DIAGNOSIS — C530 Malignant neoplasm of endocervix: Secondary | ICD-10-CM

## 2016-10-26 DIAGNOSIS — Z90710 Acquired absence of both cervix and uterus: Secondary | ICD-10-CM | POA: Diagnosis not present

## 2016-10-26 DIAGNOSIS — Z90722 Acquired absence of ovaries, bilateral: Secondary | ICD-10-CM | POA: Diagnosis not present

## 2016-10-26 DIAGNOSIS — T451X5A Adverse effect of antineoplastic and immunosuppressive drugs, initial encounter: Secondary | ICD-10-CM | POA: Insufficient documentation

## 2016-10-26 DIAGNOSIS — G8929 Other chronic pain: Secondary | ICD-10-CM | POA: Diagnosis not present

## 2016-10-26 DIAGNOSIS — Z510 Encounter for antineoplastic radiation therapy: Secondary | ICD-10-CM | POA: Diagnosis not present

## 2016-10-26 LAB — CBC WITH DIFFERENTIAL/PLATELET
BASO%: 0.1 % (ref 0.0–2.0)
BASOS ABS: 0 10*3/uL (ref 0.0–0.1)
EOS%: 0 % (ref 0.0–7.0)
Eosinophils Absolute: 0 10*3/uL (ref 0.0–0.5)
HEMATOCRIT: 27.9 % — AB (ref 34.8–46.6)
HGB: 9.5 g/dL — ABNORMAL LOW (ref 11.6–15.9)
LYMPH#: 0.4 10*3/uL — AB (ref 0.9–3.3)
LYMPH%: 14.2 % (ref 14.0–49.7)
MCH: 31.5 pg (ref 25.1–34.0)
MCHC: 33.8 g/dL (ref 31.5–36.0)
MCV: 93.2 fL (ref 79.5–101.0)
MONO#: 0.2 10*3/uL (ref 0.1–0.9)
MONO%: 5.8 % (ref 0.0–14.0)
NEUT#: 2.2 10*3/uL (ref 1.5–6.5)
NEUT%: 79.9 % — AB (ref 38.4–76.8)
PLATELETS: 51 10*3/uL — AB (ref 145–400)
RBC: 3 10*6/uL — AB (ref 3.70–5.45)
RDW: 11.9 % (ref 11.2–14.5)
WBC: 2.8 10*3/uL — ABNORMAL LOW (ref 3.9–10.3)

## 2016-10-26 LAB — BASIC METABOLIC PANEL
ANION GAP: 8 meq/L (ref 3–11)
BUN: 11.6 mg/dL (ref 7.0–26.0)
CALCIUM: 8.9 mg/dL (ref 8.4–10.4)
CO2: 27 mEq/L (ref 22–29)
CREATININE: 1 mg/dL (ref 0.6–1.1)
Chloride: 104 mEq/L (ref 98–109)
EGFR: 63 mL/min/{1.73_m2} — ABNORMAL LOW (ref >90)
GLUCOSE: 96 mg/dL (ref 70–140)
Potassium: 4.4 mEq/L (ref 3.5–5.1)
Sodium: 139 mEq/L (ref 136–145)

## 2016-10-26 LAB — MAGNESIUM: MAGNESIUM: 1.7 mg/dL (ref 1.5–2.5)

## 2016-10-26 NOTE — Progress Notes (Signed)
  Radiation Oncology         [phone]) [phone] ________________________________  Name: [name] MRN: [id number]  Date: 10/26/2016  DOB: [birth year]  Weekly Radiation Therapy Management    ICD-9-CM ICD-10-CM   1. Cervical cancer, FIGO stage IB2 (HCC) 180.9 C53.9      Current Dose: 41.4 Gy     Planned Dose:  45+ Gy  Narrative . . . . . . . . The patient presents for routine under treatment assessment.                                  The patient has completed 23 fractions to her pelvis. She will complete her treatments Thursday 10/28/16, and return for HDR treatment on Monday 11/01/16. She reports chronic back pain, rating 7/10 in severity, for which she takes Percocet with relief. She reports fatigue, and often rests during the day. The patient reports ongoing diarrhea; she reports she will have one loose stool and will take an Imodium with no further stools that day. She denies any bladder concerns. The patient did not receive her last dose of chemotherapy yesterday because of low blood counts, and received a grantix shot yesterday. She is unsure if/when her chemotherapy will be rescheduled.                                  Set-up films were reviewed.                                 The chart was checked. Physical Findings. . .  height is 5\' 2"  (1.575 m) and weight is 142 lb 9.6 oz (64.7 kg). Her temperature is 98 F (36.7 C). Her blood pressure is 121/82 and her pulse is 105 (abnormal). Her oxygen saturation is 99%. . Weight essentially stable.  Lungs are clear to auscultation bilaterally. Heart has regular rate and rhythm. No palpable cervical, supraclavicular, or axillary adenopathy. Abdomen soft, non-tender, normal bowel sounds. Impression . . . . . . . The patient is tolerating radiation. Plan . . . . . . . . . . . . Continue treatment as planned.  ________________________________   Blair Promise, PhD, MD  This document serves as a record of services personally performed by  Gery Pray, MD. It was created on his behalf by Maryla Morrow, a trained medical scribe. The creation of this record is based on the scribe's personal observations and the provider's statements to them. This document has been checked and approved by the attending provider.

## 2016-10-26 NOTE — Progress Notes (Signed)
Ms. Hollett is here for her 23rd fraction of radiation to her Pelvis. She will complete Thursday and return for her HDR treatment on Monday 11/01/16. She reports chronic back pain at 7/10 for which she takes percocet with relief. She reports fatigue and will rest often during the day. She continues to report diarrhea. She will have one loose stool and take an imodium with no further stools that day. She denies any difficulty with her bladder or urination. She did not receive her last dose of  chemotherapy yesterday because of low blood counts and received a grantix shot yesterday. She is unsure if/when it will be rescheduled.    BP 121/82   Pulse (!) 105   Temp 98 F (36.7 C)   Ht 5\' 2"  (1.575 m)   Wt 142 lb 9.6 oz (64.7 kg)   SpO2 99% Comment: room air  BMI 26.08 kg/m     Wt Readings from Last 3 Encounters:  10/26/16 142 lb 9.6 oz (64.7 kg)  10/19/16 139 lb (63 kg)  10/12/16 142 lb 6.4 oz (64.6 kg)

## 2016-10-26 NOTE — Telephone Encounter (Signed)
OK for treatment today per Dr. Sondra Come according to her CBC results today.  Lindell Noe, RN in Dr. Mariana Kaufman office to see if patient needs labs again tomorrow.  Barbaraann Share said she had a message in to Dr. Marko Plume and will let patient know.

## 2016-10-26 NOTE — Telephone Encounter (Signed)
-----   Message from Gordy Levan, MD sent at 10/26/2016  2:34 PM EST ----- Regarding: RE: CBC/difff results from today 10-26-16 Please check on her by phone. Tell her Sulligent better since granix, not neutropenic. Platelets coming up, Hgb stable. Be sure no bleeding. Please move labs and flush from 12-6 to 12-7- Do not need labs again 12-6.  Likely will DC PICC on 12-7 if platelets better then. thanks ----- Message ----- From: Baruch Merl, RN Sent: 10/26/2016   2:18 PM To: Lennis Marion Downer, MD Subject: CBC/difff results from today 10-26-16            Lennis, The CBC results are in the computer from today. You wanted to Review them. Barbaraann Share

## 2016-10-26 NOTE — Telephone Encounter (Signed)
Spoke with Grace  Turner in [place]. She is not having any bleeding. Gave her new schedule with appointment for labs on 10-28-16 at 1345. Sent scheduling message for Possible PICC line D/C after radiation treatment  ~1530 on 10-28-16 if  platlets are better. Grace Frazzini verbalized understanding.

## 2016-10-27 ENCOUNTER — Ambulatory Visit
Admission: RE | Admit: 2016-10-27 | Discharge: 2016-10-27 | Disposition: A | Discharge status: Home / Self Care | Payer: Medicare Other | Source: Ambulatory Visit | Attending: Radiation Oncology | Admitting: Radiation Oncology

## 2016-10-27 ENCOUNTER — Other Ambulatory Visit: Payer: Self-pay

## 2016-10-27 DIAGNOSIS — C530 Malignant neoplasm of endocervix: Secondary | ICD-10-CM | POA: Diagnosis not present

## 2016-10-27 DIAGNOSIS — Z510 Encounter for antineoplastic radiation therapy: Secondary | ICD-10-CM | POA: Diagnosis not present

## 2016-10-27 DIAGNOSIS — G8929 Other chronic pain: Secondary | ICD-10-CM | POA: Diagnosis not present

## 2016-10-27 DIAGNOSIS — Z90710 Acquired absence of both cervix and uterus: Secondary | ICD-10-CM | POA: Diagnosis not present

## 2016-10-27 DIAGNOSIS — M545 Low back pain: Secondary | ICD-10-CM | POA: Diagnosis not present

## 2016-10-27 DIAGNOSIS — Z90722 Acquired absence of ovaries, bilateral: Secondary | ICD-10-CM | POA: Diagnosis not present

## 2016-10-28 ENCOUNTER — Telehealth: Payer: Self-pay | Admitting: Oncology

## 2016-10-28 ENCOUNTER — Ambulatory Visit
Admission: RE | Admit: 2016-10-28 | Discharge: 2016-10-28 | Disposition: A | Discharge status: Home / Self Care | Payer: Medicare Other | Source: Ambulatory Visit | Attending: Radiation Oncology | Admitting: Radiation Oncology

## 2016-10-28 ENCOUNTER — Other Ambulatory Visit: Payer: Self-pay | Admitting: Oncology

## 2016-10-28 ENCOUNTER — Other Ambulatory Visit (HOSPITAL_BASED_OUTPATIENT_CLINIC_OR_DEPARTMENT_OTHER): Payer: Medicare Other

## 2016-10-28 ENCOUNTER — Ambulatory Visit (HOSPITAL_BASED_OUTPATIENT_CLINIC_OR_DEPARTMENT_OTHER): Payer: Medicare Other

## 2016-10-28 ENCOUNTER — Ambulatory Visit: Payer: Medicare Other

## 2016-10-28 DIAGNOSIS — D701 Agranulocytosis secondary to cancer chemotherapy: Secondary | ICD-10-CM | POA: Diagnosis present

## 2016-10-28 DIAGNOSIS — Z452 Encounter for adjustment and management of vascular access device: Secondary | ICD-10-CM

## 2016-10-28 DIAGNOSIS — M545 Low back pain: Secondary | ICD-10-CM | POA: Diagnosis not present

## 2016-10-28 DIAGNOSIS — Z90710 Acquired absence of both cervix and uterus: Secondary | ICD-10-CM | POA: Diagnosis not present

## 2016-10-28 DIAGNOSIS — Z510 Encounter for antineoplastic radiation therapy: Secondary | ICD-10-CM | POA: Diagnosis not present

## 2016-10-28 DIAGNOSIS — C530 Malignant neoplasm of endocervix: Secondary | ICD-10-CM | POA: Diagnosis present

## 2016-10-28 DIAGNOSIS — Z90722 Acquired absence of ovaries, bilateral: Secondary | ICD-10-CM | POA: Diagnosis not present

## 2016-10-28 DIAGNOSIS — C539 Malignant neoplasm of cervix uteri, unspecified: Secondary | ICD-10-CM

## 2016-10-28 DIAGNOSIS — G8929 Other chronic pain: Secondary | ICD-10-CM | POA: Diagnosis not present

## 2016-10-28 LAB — CBC WITH DIFFERENTIAL/PLATELET
BASO%: 0 % (ref 0.0–2.0)
Basophils Absolute: 0 10*3/uL (ref 0.0–0.1)
EOS%: 0.7 % (ref 0.0–7.0)
Eosinophils Absolute: 0 10*3/uL (ref 0.0–0.5)
HCT: 25.6 % — ABNORMAL LOW (ref 34.8–46.6)
HEMOGLOBIN: 8.8 g/dL — AB (ref 11.6–15.9)
LYMPH%: 35.8 % (ref 14.0–49.7)
MCH: 31.7 pg (ref 25.1–34.0)
MCHC: 34.4 g/dL (ref 31.5–36.0)
MCV: 92.1 fL (ref 79.5–101.0)
MONO#: 0.2 10*3/uL (ref 0.1–0.9)
MONO%: 10.9 % (ref 0.0–14.0)
NEUT%: 52.6 % (ref 38.4–76.8)
NEUTROS ABS: 0.7 10*3/uL — AB (ref 1.5–6.5)
Platelets: 33 10*3/uL — ABNORMAL LOW (ref 145–400)
RBC: 2.78 10*6/uL — AB (ref 3.70–5.45)
RDW: 12 % (ref 11.2–14.5)
WBC: 1.4 10*3/uL — AB (ref 3.9–10.3)
lymph#: 0.5 10*3/uL — ABNORMAL LOW (ref 0.9–3.3)

## 2016-10-28 MED ORDER — HEPARIN SOD (PORK) LOCK FLUSH 100 UNIT/ML IV SOLN
250.0000 [IU] | Freq: Once | INTRAVENOUS | Status: AC | PRN
  Administered 2016-10-28: 500 [IU]
  Filled 2016-10-28: qty 5

## 2016-10-28 MED ORDER — SODIUM CHLORIDE 0.9% FLUSH
10.0000 mL | INTRAVENOUS | Status: DC | PRN
  Filled 2016-10-28: qty 10

## 2016-10-28 MED ORDER — HEPARIN SOD (PORK) LOCK FLUSH 100 UNIT/ML IV SOLN
500.0000 [IU] | Freq: Once | INTRAVENOUS | Status: DC
  Filled 2016-10-28: qty 5

## 2016-10-28 MED ORDER — SODIUM CHLORIDE 0.9% FLUSH
10.0000 mL | INTRAVENOUS | Status: DC | PRN
  Administered 2016-10-28: 20 mL
  Filled 2016-10-28: qty 10

## 2016-10-28 MED ORDER — TBO-FILGRASTIM 300 MCG/0.5ML ~~LOC~~ SOSY
300.0000 ug | PREFILLED_SYRINGE | Freq: Once | SUBCUTANEOUS | Status: AC
  Administered 2016-10-28: 300 ug via SUBCUTANEOUS
  Filled 2016-10-28: qty 0.5

## 2016-10-28 NOTE — Telephone Encounter (Signed)
Patient still in office and was given schedule for December with new add on appointments for 12/8.

## 2016-10-28 NOTE — Telephone Encounter (Signed)
Added appointments for 12/8. Not able to reach patient by phone or leave message - voicemail not set up.

## 2016-10-28 NOTE — Telephone Encounter (Signed)
Received message from [place], [place] with Dr. Mariana Kaufman office regarding Seeley's low ANC and platelet results from today.  Per Dr. Sondra Come, patient can have radiation treatment today.  Jurie Dubroc to check in for an injection after her treatment.  She verbalized understanding and agreement.

## 2016-10-28 NOTE — Progress Notes (Signed)
Discussed plan with pt: lab with possible granix tomorrow 12/8, and possible PICC dc.  Parameters from Dr Marko Plume to pull PICC: when not neutropenic and platelets >= 75 thousand.

## 2016-10-29 ENCOUNTER — Ambulatory Visit: Payer: Medicare Other

## 2016-10-29 ENCOUNTER — Ambulatory Visit (HOSPITAL_BASED_OUTPATIENT_CLINIC_OR_DEPARTMENT_OTHER): Payer: Medicare Other | Admitting: Oncology

## 2016-10-29 ENCOUNTER — Other Ambulatory Visit (HOSPITAL_BASED_OUTPATIENT_CLINIC_OR_DEPARTMENT_OTHER): Payer: Medicare Other

## 2016-10-29 ENCOUNTER — Encounter: Payer: Self-pay | Admitting: *Deleted

## 2016-10-29 ENCOUNTER — Other Ambulatory Visit: Payer: Self-pay | Admitting: Oncology

## 2016-10-29 ENCOUNTER — Encounter: Payer: Self-pay | Admitting: Oncology

## 2016-10-29 ENCOUNTER — Telehealth: Payer: Self-pay | Admitting: *Deleted

## 2016-10-29 VITALS — BP 117/68 | HR 93 | Temp 98.4°F | Resp 20 | Ht 62.0 in | Wt 140.6 lb

## 2016-10-29 DIAGNOSIS — Z452 Encounter for adjustment and management of vascular access device: Secondary | ICD-10-CM

## 2016-10-29 DIAGNOSIS — G8921 Chronic pain due to trauma: Secondary | ICD-10-CM | POA: Diagnosis not present

## 2016-10-29 DIAGNOSIS — D702 Other drug-induced agranulocytosis: Secondary | ICD-10-CM

## 2016-10-29 DIAGNOSIS — M810 Age-related osteoporosis without current pathological fracture: Secondary | ICD-10-CM

## 2016-10-29 DIAGNOSIS — C530 Malignant neoplasm of endocervix: Secondary | ICD-10-CM

## 2016-10-29 DIAGNOSIS — R911 Solitary pulmonary nodule: Secondary | ICD-10-CM | POA: Diagnosis not present

## 2016-10-29 DIAGNOSIS — D6959 Other secondary thrombocytopenia: Secondary | ICD-10-CM

## 2016-10-29 DIAGNOSIS — C539 Malignant neoplasm of cervix uteri, unspecified: Secondary | ICD-10-CM

## 2016-10-29 DIAGNOSIS — D6481 Anemia due to antineoplastic chemotherapy: Secondary | ICD-10-CM

## 2016-10-29 DIAGNOSIS — T451X5A Adverse effect of antineoplastic and immunosuppressive drugs, initial encounter: Secondary | ICD-10-CM

## 2016-10-29 DIAGNOSIS — D61810 Antineoplastic chemotherapy induced pancytopenia: Secondary | ICD-10-CM | POA: Insufficient documentation

## 2016-10-29 DIAGNOSIS — D701 Agranulocytosis secondary to cancer chemotherapy: Secondary | ICD-10-CM | POA: Diagnosis not present

## 2016-10-29 LAB — COMPREHENSIVE METABOLIC PANEL
ALBUMIN: 3.2 g/dL — AB (ref 3.5–5.0)
ALT: 11 U/L (ref 0–55)
AST: 14 U/L (ref 5–34)
Alkaline Phosphatase: 117 U/L (ref 40–150)
Anion Gap: 8 mEq/L (ref 3–11)
BUN: 14.1 mg/dL (ref 7.0–26.0)
CO2: 26 mEq/L (ref 22–29)
Calcium: 8.9 mg/dL (ref 8.4–10.4)
Chloride: 105 mEq/L (ref 98–109)
Creatinine: 1 mg/dL (ref 0.6–1.1)
EGFR: 61 mL/min/{1.73_m2} — ABNORMAL LOW (ref >90)
GLUCOSE: 104 mg/dL (ref 70–140)
Potassium: 4.5 mEq/L (ref 3.5–5.1)
SODIUM: 139 meq/L (ref 136–145)
TOTAL PROTEIN: 5.8 g/dL — AB (ref 6.4–8.3)
Total Bilirubin: 0.42 mg/dL (ref 0.20–1.20)

## 2016-10-29 LAB — CBC WITH DIFFERENTIAL/PLATELET
BASO%: 0 % (ref 0.0–2.0)
Basophils Absolute: 0 10*3/uL (ref 0.0–0.1)
EOS%: 0 % (ref 0.0–7.0)
Eosinophils Absolute: 0 10*3/uL (ref 0.0–0.5)
HCT: 24.6 % — ABNORMAL LOW (ref 34.8–46.6)
HEMOGLOBIN: 8.4 g/dL — AB (ref 11.6–15.9)
LYMPH%: 41.7 % (ref 14.0–49.7)
MCH: 31.5 pg (ref 25.1–34.0)
MCHC: 34.1 g/dL (ref 31.5–36.0)
MCV: 92.1 fL (ref 79.5–101.0)
MONO#: 0.2 10*3/uL (ref 0.1–0.9)
MONO%: 18.8 % — AB (ref 0.0–14.0)
NEUT%: 39.5 % (ref 38.4–76.8)
NEUTROS ABS: 0.4 10*3/uL — AB (ref 1.5–6.5)
Platelets: 30 10*3/uL — ABNORMAL LOW (ref 145–400)
RBC: 2.67 10*6/uL — ABNORMAL LOW (ref 3.70–5.45)
RDW: 12.1 % (ref 11.2–14.5)
WBC: 1 10*3/uL — AB (ref 3.9–10.3)
lymph#: 0.4 10*3/uL — ABNORMAL LOW (ref 0.9–3.3)

## 2016-10-29 LAB — MAGNESIUM: Magnesium: 1.7 mg/dl (ref 1.5–2.5)

## 2016-10-29 MED ORDER — SODIUM CHLORIDE 0.9% FLUSH
10.0000 mL | INTRAVENOUS | Status: DC | PRN
  Administered 2016-10-29: 10 mL
  Filled 2016-10-29: qty 10

## 2016-10-29 MED ORDER — TBO-FILGRASTIM 300 MCG/0.5ML ~~LOC~~ SOSY
300.0000 ug | PREFILLED_SYRINGE | Freq: Once | SUBCUTANEOUS | Status: AC
  Administered 2016-10-29: 300 ug via SUBCUTANEOUS
  Filled 2016-10-29: qty 0.5

## 2016-10-29 MED ORDER — HEPARIN SOD (PORK) LOCK FLUSH 100 UNIT/ML IV SOLN
250.0000 [IU] | Freq: Once | INTRAVENOUS | Status: AC | PRN
  Administered 2016-10-29: 250 [IU]
  Filled 2016-10-29: qty 5

## 2016-10-29 MED ORDER — CEPHALEXIN 500 MG PO CAPS: 500.0000 mg | ORAL_CAPSULE | Freq: Two times a day (BID) | ORAL | 0 refills | Status: DC

## 2016-10-29 NOTE — Progress Notes (Signed)
PICC removed from right A/C (37cm) with MD Livesay at bedside. Vaseline gauze pressed and placed on PICC site. Patient denies any signs/symptoms after PICC removal.

## 2016-10-29 NOTE — Progress Notes (Signed)
OFFICE PROGRESS NOTE   October 29, 2016   Physicians: Anselm Jungling, MD(PCP), Ocie Cornfield, Renie Ora with [facility], Glenna Fellows), J.Kinard , Oretha Milch  INTERVAL HISTORY:  Patient is seen, alone for visit, in continuing attention to low blood counts related to recent chemo and pelvic radiation, and with PICC still in place. She has had no fever or symptoms of infection, and no bleeding. Last chemo was #4 CDDP on 10-18-16; pelvic radiation completed 10-28-16. She had granix on 12-4 and 10-28-16. PICC was left in place on 12-7 with thrombocytopenia and neutropenia, however counts have not improved as yet and risk of infection from PICC seems to outweigh removal with low platelets today. She is for CT sim and first HDR on 11-01-16.   Patient has been fatigued and has had slight aching in hips since granix. Only bruising is at site of antecubital blood draw from 10-28-16. PICC is not tender, no swelling RUE, no SOB or chest discomfort. Appetite is better, taste is better. She has had no diarrhea in last 4-5 days and no significant nausea. Perirectal area not irritated, skin in RT field not irritated. No bladder symptoms.  Remainder of 10 point Review of Systems negative  PICC placed 10-05-16 Flu vaccine 08-06-16 No genetics counseling (Note family history osteosarcoma, leukemia, colon ca). Family history also hairy cell leukemia in sister.  ONCOLOGIC HISTORY Patient had intermittent vaginal spotting beginning ~ 04-2016. She had sonohystogram by Dr Matthew Saras 06-01-16 with 2.4 cm endometrial polyp identified. 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 She saw Dr Denman George in consultation, then had robotic assisted TAH BSO sentinel node on 08-05-16. Surgical pathology [id number] invasive adenocarcinoma of endocervix with isolated tumor cells in right external iliac node and micromet in left external iliac node. Review of  path confirms endocervical primary without synchronous endometrial carcinoma. PET 08-31-16 no obvious metastatic disease, 8 mm RUL lung nodule will be followed up on next scans. Recommendation is radiation with sensitizing chemotherapy. First CDDP given 09-27-16. Second CDDP delayed due to inadequate venous access, resumed 11-15 after PICC. Cycle 4 CDDP given 10-18-16, then last planned chemo held due to neutropenia and thrombocytopenia. IMRT completed 10-28-16.    Objective:  Vital signs in last 24 hours:  117/68, 98.4, 93 regular, 99%, 140 lbs Alert, oriented and appropriate. Looks comfortable, respirations not labored.  Ambulatory without difficulty.  No alopecia  HEENT:PERRL, sclerae not icteric. Oral mucosa moist without lesions, posterior pharynx clear Resp: clear to auscultation bilaterally  Cardio: regular rate and rhythm. No gallop. GI: soft, nontender, not distended, no mass or organomegaly.  Some bowel sounds.  Musculoskeletal/ Extremities: UE/ LE without pitting edema, cords, tenderness Neuro: no change peripheral neuropathy. Otherwise nonfocal Skin without rash, minimal ecchymosis at left antecubital, no petechiae PICC-  insertion site not remarkable, no swelling or erythema arm.   PICC removed without difficulty by certified RN. Pressure held to site x 20 min, no bleeding, slight bruising and petechiae in area following direct pressure x 20 min. Distal pulses intact, no swelling, no discomfort. Patient monitored x >20 min after PICC removed, stable, DC home with daughter driving.  Lab Results:  Results for orders placed or performed in visit on 10/29/16  CBC with Differential  Result Value Ref Range   WBC 1.0 (L) 3.9 - 10.3 10e3/uL   NEUT# 0.4 (LL) 1.5 - 6.5 10e3/uL   HGB 8.4 (L) 11.6 - 15.9 g/dL   HCT 24.6 (  L) 34.8 - 46.6 %   Platelets 30 (L) 145 - 400 10e3/uL   MCV 92.1 79.5 - 101.0 fL   MCH 31.5 25.1 - 34.0 pg   MCHC 34.1 31.5 - 36.0 g/dL   RBC 2.67 (L) 3.70 - 5.45  10e6/uL   RDW 12.1 11.2 - 14.5 %   lymph# 0.4 (L) 0.9 - 3.3 10e3/uL   MONO# 0.2 0.1 - 0.9 10e3/uL   Eosinophils Absolute 0.0 0.0 - 0.5 10e3/uL   Basophils Absolute 0.0 0.0 - 0.1 10e3/uL   NEUT% 39.5 38.4 - 76.8 %   LYMPH% 41.7 14.0 - 49.7 %   MONO% 18.8 (H) 0.0 - 14.0 %   EOS% 0.0 0.0 - 7.0 %   BASO% 0.0 0.0 - 2.0 %  Comprehensive metabolic panel  Result Value Ref Range   Sodium 139 136 - 145 mEq/L   Potassium 4.5 3.5 - 5.1 mEq/L   Chloride 105 98 - 109 mEq/L   CO2 26 22 - 29 mEq/L   Glucose 104 70 - 140 mg/dl   BUN 14.1 7.0 - 26.0 mg/dL   Creatinine 1.0 0.6 - 1.1 mg/dL   Total Bilirubin 0.42 0.20 - 1.20 mg/dL   Alkaline Phosphatase 117 40 - 150 U/L   AST 14 5 - 34 U/L   ALT 11 0 - 55 U/L   Total Protein 5.8 (L) 6.4 - 8.3 g/dL   Albumin 3.2 (L) 3.5 - 5.0 g/dL   Calcium 8.9 8.4 - 10.4 mg/dL   Anion Gap 8 3 - 11 mEq/L   EGFR 61 (L) >90 ml/min/1.73 m2  Magnesium  Result Value Ref Range   Magnesium 1.7 1.5 - 2.5 mg/dl     Studies/Results:  No results found.  Medications: I have reviewed the patient's current medications. Will start Keflex 500 mg bid thru weekend since PICC removal, likely can DC with recovery of ANC. Granix 300 today and on 12-9. May need additional granix on 12-11. Discussed claritin for granix aches.  DISCUSSION Meds as above. Dressing placed to PICC insertion site., will keep intact until counts on 12-11. Reviewed neutropenic precautions  She is interested in genetics counseling, referral to be made She understands scans to be repeated following completion of radiation, timing per gyn onc and rad onc.   Assessment/Plan:  1. IB2 endocervical adenocarcinoma: Path reviewed, consensusthat there is not a synchronous endometrial carcinoma. No obvious metastatic disease by PET 08-31-16. Sensitizing CDDP given x 4 rrom 09-27-16 thru 10-18-16, cycle 5 not given with neutropenia and thrombocytopenia today.  IMRT to complete ~ 10-28-16, then brachytherapy.   Message to radiation oncology re counts.  I will see her with labs on 12-18.  2. Neutropenia, thrombocytopenia and anemia related to chemo and pelvic radiation:  Not febrile, not bleeding, not symptomatic from anemia at rest. Granix continues, bleeding and neutropenic precautions, repeat counts 12-11.  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 On chronic pain meds by pain management clinic. 5.PICC placed 10-05-16, DCd today despite low platelets due to infection risk as also neutropenic. Tolerated well. Keflex begin this afternoon. If she needs additional chemo beyond sensitizing CDDP, willneed PAC. 6.family history osteosarcoma in son, leukemia mother and hairy cell leukemia in sister, colon cancer . She would like genetics counseling. 7.post extractions of 10 teeth by Dr Enrique Sack 09-08-16, healed. Still caries that need to be addressed by dentist of choice after chemo/ RT. 8. 8 mm RUL pulmonary nodule  on PET/ CT, will need to follow up with next scans. Question of nodular opacity left lung base by pre op CXR. Note pneumonia 2015 was RLL per xray reports. 9.overdue mammograms, last at Dr [facility] office ~ 2 years ago.  10.never colonoscopy 11.osteoporosis per patient, no DEXA in this EMR 12.flu vaccine 08-06-16    All questions answered and she knows to call if bleeding or temp >=100.5 or symptoms of infection. Granix orders placed. Time spent 25 min including >50% counseling and coordination of care.    Evlyn Clines, MD   10/29/2016, 1:36 PM

## 2016-10-29 NOTE — Telephone Encounter (Signed)
Called patient to remind of HDR Vag. Cuff Case, no answer will call later.

## 2016-10-30 ENCOUNTER — Ambulatory Visit (HOSPITAL_BASED_OUTPATIENT_CLINIC_OR_DEPARTMENT_OTHER): Payer: Medicare Other

## 2016-10-30 ENCOUNTER — Other Ambulatory Visit: Payer: Self-pay | Admitting: Oncology

## 2016-10-30 VITALS — BP 143/95 | HR 103 | Temp 98.6°F | Resp 20

## 2016-10-30 DIAGNOSIS — C530 Malignant neoplasm of endocervix: Secondary | ICD-10-CM

## 2016-10-30 DIAGNOSIS — D61810 Antineoplastic chemotherapy induced pancytopenia: Secondary | ICD-10-CM | POA: Diagnosis present

## 2016-10-30 DIAGNOSIS — Z452 Encounter for adjustment and management of vascular access device: Secondary | ICD-10-CM

## 2016-10-30 MED ORDER — TBO-FILGRASTIM 300 MCG/0.5ML ~~LOC~~ SOSY
300.0000 ug | PREFILLED_SYRINGE | Freq: Once | SUBCUTANEOUS | Status: AC
  Administered 2016-10-30: 300 ug via SUBCUTANEOUS

## 2016-10-30 NOTE — Patient Instructions (Signed)
Filgrastim, G-CSF injection What is this medicine? FILGRASTIM, G-CSF (fil GRA stim) is a granulocyte colony-stimulating factor that stimulates the growth of neutrophils, a type of white blood cell (WBC) important in the body's fight against infection. It is used to reduce the incidence of fever and infection in patients with certain types of cancer who are receiving chemotherapy that affects the bone marrow, to stimulate blood cell production for removal of WBCs from the body prior to a bone marrow transplantation, to reduce the incidence of fever and infection in patients who have severe chronic neutropenia, and to improve survival outcomes following high-dose radiation exposure that is toxic to the bone marrow. This medicine may be used for other purposes; ask your health care provider or pharmacist if you have questions. COMMON BRAND NAME(S): Neupogen, Zarxio What should I tell my health care provider before I take this medicine? They need to know if you have any of these conditions: -kidney disease -latex allergy -ongoing radiation therapy -sickle cell disease -an unusual or allergic reaction to filgrastim, pegfilgrastim, other medicines, foods, dyes, or preservatives -pregnant or trying to get pregnant -breast-feeding How should I use this medicine? This medicine is for injection under the skin or infusion into a vein. As an infusion into a vein, it is usually given by a health care professional in a hospital or clinic setting. If you get this medicine at home, you will be taught how to prepare and give this medicine. Refer to the Instructions for Use that come with your medication packaging. Use exactly as directed. Take your medicine at regular intervals. Do not take your medicine more often than directed. It is important that you put your used needles and syringes in a special sharps container. Do not put them in a trash can. If you do not have a sharps container, call your pharmacist or  healthcare provider to get one. Talk to your pediatrician regarding the use of this medicine in children. While this drug may be prescribed for children as young as 7 months for selected conditions, precautions do apply. Overdosage: If you think you have taken too much of this medicine contact a poison control center or emergency room at once. NOTE: This medicine is only for you. Do not share this medicine with others. What if I miss a dose? It is important not to miss your dose. Call your doctor or health care professional if you miss a dose. What may interact with this medicine? This medicine may interact with the following medications: -medicines that may cause a release of neutrophils, such as lithium This list may not describe all possible interactions. Give your health care provider a list of all the medicines, herbs, non-prescription drugs, or dietary supplements you use. Also tell them if you smoke, drink alcohol, or use illegal drugs. Some items may interact with your medicine. What should I watch for while using this medicine? You may need blood work done while you are taking this medicine. What side effects may I notice from receiving this medicine? Side effects that you should report to your doctor or health care professional as soon as possible: -allergic reactions like skin rash, itching or hives, swelling of the face, lips, or tongue -dizziness or feeling faint -fever -pain, redness, or irritation at site where injected -pinpoint red spots on the skin -shortness of breath or breathing problems -signs and symptoms of kidney injury like trouble passing urine, change in the amount of urine, or red or dark-brown urine -stomach or side pain,   or pain at the shoulder -swelling -tiredness - unusual bleeding or bruising Side effects that usually do not require medical attention (report to your doctor or health care professional if they continue or are bothersome): -bone  pain -headache -muscle pain This list may not describe all possible side effects. Call your doctor for medical advice about side effects. You may report side effects to FDA at 1-800-FDA-1088. Where should I keep my medicine? Keep out of the reach of children. Store in a refrigerator between 2 and 8 degrees C (36 and 46 degrees F). Do not freeze. Keep in carton to protect from light. Throw away this medicine if vials or syringes are left out of the refrigerator for more than 24 hours. Throw away any unused medicine after the expiration date. NOTE: This sheet is a summary. It may not cover all possible information. If you have questions about this medicine, talk to your doctor, pharmacist, or health care provider.  2017 Elsevier/Gold Standard (2015-05-28 10:57:13)  

## 2016-11-01 ENCOUNTER — Encounter: Payer: Self-pay | Admitting: Radiation Oncology

## 2016-11-01 ENCOUNTER — Other Ambulatory Visit: Payer: Self-pay | Admitting: Oncology

## 2016-11-01 ENCOUNTER — Ambulatory Visit (HOSPITAL_COMMUNITY)
Admission: RE | Admit: 2016-11-01 | Discharge: 2016-11-01 | Disposition: A | Discharge status: Home / Self Care | Payer: Medicare Other | Source: Ambulatory Visit | Attending: Oncology | Admitting: Oncology

## 2016-11-01 ENCOUNTER — Telehealth: Payer: Self-pay | Admitting: Oncology

## 2016-11-01 ENCOUNTER — Ambulatory Visit: Payer: Medicare Other

## 2016-11-01 ENCOUNTER — Ambulatory Visit
Admission: RE | Admit: 2016-11-01 | Discharge: 2016-11-01 | Disposition: A | Discharge status: Home / Self Care | Payer: Medicare Other | Source: Ambulatory Visit | Attending: Radiation Oncology | Admitting: Radiation Oncology

## 2016-11-01 ENCOUNTER — Ambulatory Visit (HOSPITAL_BASED_OUTPATIENT_CLINIC_OR_DEPARTMENT_OTHER): Payer: Medicare Other

## 2016-11-01 ENCOUNTER — Other Ambulatory Visit: Payer: Self-pay

## 2016-11-01 ENCOUNTER — Ambulatory Visit: Payer: Medicare Other | Admitting: Radiation Oncology

## 2016-11-01 DIAGNOSIS — D6481 Anemia due to antineoplastic chemotherapy: Secondary | ICD-10-CM | POA: Diagnosis not present

## 2016-11-01 DIAGNOSIS — C530 Malignant neoplasm of endocervix: Secondary | ICD-10-CM

## 2016-11-01 DIAGNOSIS — T451X5A Adverse effect of antineoplastic and immunosuppressive drugs, initial encounter: Principal | ICD-10-CM

## 2016-11-01 DIAGNOSIS — G8929 Other chronic pain: Secondary | ICD-10-CM | POA: Diagnosis not present

## 2016-11-01 DIAGNOSIS — R197 Diarrhea, unspecified: Secondary | ICD-10-CM | POA: Insufficient documentation

## 2016-11-01 DIAGNOSIS — C539 Malignant neoplasm of cervix uteri, unspecified: Secondary | ICD-10-CM

## 2016-11-01 DIAGNOSIS — R5383 Other fatigue: Secondary | ICD-10-CM | POA: Diagnosis not present

## 2016-11-01 DIAGNOSIS — R300 Dysuria: Secondary | ICD-10-CM | POA: Insufficient documentation

## 2016-11-01 DIAGNOSIS — Z79899 Other long term (current) drug therapy: Secondary | ICD-10-CM | POA: Insufficient documentation

## 2016-11-01 DIAGNOSIS — M545 Low back pain: Secondary | ICD-10-CM | POA: Diagnosis not present

## 2016-11-01 DIAGNOSIS — R110 Nausea: Secondary | ICD-10-CM | POA: Diagnosis not present

## 2016-11-01 DIAGNOSIS — Z923 Personal history of irradiation: Secondary | ICD-10-CM | POA: Diagnosis not present

## 2016-11-01 DIAGNOSIS — D701 Agranulocytosis secondary to cancer chemotherapy: Secondary | ICD-10-CM

## 2016-11-01 DIAGNOSIS — Z452 Encounter for adjustment and management of vascular access device: Secondary | ICD-10-CM

## 2016-11-01 DIAGNOSIS — Z90722 Acquired absence of ovaries, bilateral: Secondary | ICD-10-CM | POA: Diagnosis not present

## 2016-11-01 DIAGNOSIS — Z510 Encounter for antineoplastic radiation therapy: Secondary | ICD-10-CM | POA: Diagnosis not present

## 2016-11-01 DIAGNOSIS — Z90710 Acquired absence of both cervix and uterus: Secondary | ICD-10-CM | POA: Diagnosis not present

## 2016-11-01 LAB — COMPREHENSIVE METABOLIC PANEL
ALBUMIN: 3.3 g/dL — AB (ref 3.5–5.0)
ALK PHOS: 120 U/L (ref 40–150)
ALT: 12 U/L (ref 0–55)
ANION GAP: 10 meq/L (ref 3–11)
AST: 13 U/L (ref 5–34)
BUN: 14.3 mg/dL (ref 7.0–26.0)
CALCIUM: 8.9 mg/dL (ref 8.4–10.4)
CHLORIDE: 106 meq/L (ref 98–109)
CO2: 25 mEq/L (ref 22–29)
Creatinine: 0.9 mg/dL (ref 0.6–1.1)
EGFR: 68 mL/min/{1.73_m2} — AB (ref >90)
Glucose: 110 mg/dl (ref 70–140)
POTASSIUM: 4.2 meq/L (ref 3.5–5.1)
Sodium: 141 mEq/L (ref 136–145)
Total Bilirubin: 0.31 mg/dL (ref 0.20–1.20)
Total Protein: 6 g/dL — ABNORMAL LOW (ref 6.4–8.3)

## 2016-11-01 LAB — CBC WITH DIFFERENTIAL/PLATELET
BASO%: 0 % (ref 0.0–2.0)
BASOS ABS: 0 10*3/uL (ref 0.0–0.1)
EOS ABS: 0 10*3/uL (ref 0.0–0.5)
EOS%: 0 % (ref 0.0–7.0)
HCT: 23.4 % — ABNORMAL LOW (ref 34.8–46.6)
HEMOGLOBIN: 7.9 g/dL — AB (ref 11.6–15.9)
LYMPH#: 0.5 10*3/uL — AB (ref 0.9–3.3)
LYMPH%: 32.7 % (ref 14.0–49.7)
MCH: 31.6 pg (ref 25.1–34.0)
MCHC: 33.8 g/dL (ref 31.5–36.0)
MCV: 93.6 fL (ref 79.5–101.0)
MONO#: 0.1 10*3/uL (ref 0.1–0.9)
MONO%: 5.9 % (ref 0.0–14.0)
NEUT#: 0.9 10*3/uL — ABNORMAL LOW (ref 1.5–6.5)
NEUT%: 61.4 % (ref 38.4–76.8)
PLATELETS: 44 10*3/uL — AB (ref 145–400)
RBC: 2.5 10*6/uL — ABNORMAL LOW (ref 3.70–5.45)
RDW: 12.3 % (ref 11.2–14.5)
WBC: 1.5 10*3/uL — ABNORMAL LOW (ref 3.9–10.3)

## 2016-11-01 LAB — MAGNESIUM: Magnesium: 1.7 mg/dl (ref 1.5–2.5)

## 2016-11-01 LAB — PREPARE RBC (CROSSMATCH)

## 2016-11-01 MED ORDER — TBO-FILGRASTIM 300 MCG/0.5ML ~~LOC~~ SOSY
300.0000 ug | PREFILLED_SYRINGE | Freq: Once | SUBCUTANEOUS | Status: AC
  Administered 2016-11-01: 300 ug via SUBCUTANEOUS
  Filled 2016-11-01: qty 0.5

## 2016-11-01 NOTE — Telephone Encounter (Signed)
Attempted to call [name] regarding her appointment for follow up this morning.  Her voice mail is not set up so was unable to leave a message.

## 2016-11-01 NOTE — Telephone Encounter (Signed)
Received voice mail from Dr. Marko Plume regarding [name]'s blood work from today.  Gave lab results to Dr. Sondra Come to review and he replied that she is OK to have HDR treatment today.  Lindell Noe, RN with Dr. Mariana Kaufman office back and notified her that Ms. Ringler is OK to be treated today.  Also notified CT [facility] therapists.

## 2016-11-01 NOTE — Progress Notes (Signed)
  Radiation Oncology         [phone] ________________________________  Name: [name] MRN: [id number]  Date: 11/01/2016  DOB: Dec 28, 1950  SIMULATION AND TREATMENT PLANNING NOTE HDR BRACHYTHERAPY  DIAGNOSIS:  Cervical cancer, FIGO IB2  NARRATIVE:  The patient was brought to the [place] suite.  Identity was confirmed.  All relevant records and images related to the planned course of therapy were reviewed.  The patient freely provided informed written consent to proceed with treatment after reviewing the details related to the planned course of therapy. The consent form was witnessed and verified by the simulation staff.  Then, the patient was set-up in a stable reproducible  supine position for radiation therapy.  CT images were obtained.  Surface markings were placed.  The CT images were loaded into the planning software.  Then the target and avoidance structures were contoured.  Treatment planning then occurred.  The radiation prescription was entered and confirmed.   I have requested : Brachytherapy Isodose Plan and Dosimetry Calculations to plan the radiation distribution.    PLAN:  The patient will receive 18 Gy in 3 fractions. Treatment length will be 3 cm. A 3 cm diameter cylinder will be used for treatment. Prescription will be 6 gray to the mucosal surface. Iridium 192 will be the high-dose-rate source.    ________________________________  Blair Promise, PhD, MD  This document serves as a record of services personally performed by Gery Pray, MD. It was created on his behalf by Bethann Humble, a trained medical scribe. The creation of this record is based on the scribe's personal observations and the provider's statements to them. This document has been checked and approved by the attending provider.

## 2016-11-01 NOTE — Progress Notes (Signed)
Radiation Oncology         (336) [phone] ________________________________  Name: [name] MRN: [id number]  Date: 11/01/2016  DOB: 03/16/1951  Vaginal Brachytherapy Procedure Note  CC: Horatio Pel, MD Deland Pretty, MD    ICD-9-CM ICD-10-CM   1. Cervical cancer, FIGO stage IB2 ([facility]) 180.9 C53.9     Diagnosis: Cervical Cancer, FIGO stage IB2  Radiation Treatment Dates: 09/23/16-10/28/16  Narrative: She returns today for vaginal cylinder fitting. She reports having chronic 7/10 pain in her lower back. She has also reports a knot on her left wrist that appeared over the weekend. It is red and tender to the touch. Patient notes dysuria this morning. She believes this is the result of drinking coffee.  She reports occasional diarrhea and occasional nausea. She reports fatigue. She denies having any vaginal bleeding.  ALLERGIES: has No Known Allergies.  Meds: Current Outpatient Prescriptions  Medication Sig Dispense Refill  . cephALEXin (KEFLEX) 500 MG capsule Take 1 capsule (500 mg total) by mouth every 12 (twelve) hours. 10 capsule 0  . gabapentin (NEURONTIN) 300 MG capsule Take 300 mg by mouth 4 (four) times daily.    Marland Kitchen loperamide (IMODIUM A-D) 2 MG tablet Take 2-4 mg by mouth 4 (four) times daily as needed for diarrhea or loose stools.    . ondansetron (ZOFRAN) 8 MG tablet Take 1 tablet (8 mg total) by mouth every 8 (eight) hours as needed for nausea (Will not make drowsy.). 30 tablet 0  . oxyCODONE-acetaminophen (PERCOCET) 7.5-325 MG tablet Take 2 tablets by mouth every 8 (eight) hours as needed for severe pain.     Marland Kitchen docusate sodium (COLACE) 100 MG capsule Take 100 mg by mouth 2 (two) times daily.    Marland Kitchen LORazepam (ATIVAN) 0.5 MG tablet Place 1 tablet under the tongue or swallow every 6 hrs as needed for nausea. Will make drowsy. (Patient not taking: Reported on 11/01/2016) 20 tablet 0  . polyethylene glycol (MIRALAX / GLYCOLAX) packet Take 17 g by mouth daily as  needed for moderate constipation.     No current facility-administered medications for this encounter.     Physical Findings: The patient is in no acute distress. Patient is alert and oriented.  height is 5\' 2"  (1.575 m) and weight is 145 lb (65.8 kg). Her oral temperature is 97.9 F (36.6 C). Her blood pressure is 117/80 and her pulse is 97. Her oxygen saturation is 100%.   No palpable cervical, supraclavicular or axillary lymphoadenopathy. The heart has a regular rate and rhythm. The lungs are clear to auscultation. Abdomen soft and non-tender.  On pelvic examination the external genitalia were unremarkable. A speculum exam was performed. Vaginal cuff intact, no mucosal lesions. On bimanual exam there were no pelvic masses appreciated.  Patient was fitted for a vaginal cylinder. The patient will be treated with a 3 cm diameter cylinder with a treatment length of 3 cm. This distended the vaginal vault without undue discomfort. The patient tolerated the procedure well.  Lab Findings: Lab Results  Component Value Date   WBC 1.5 (L) 11/01/2016   HGB 7.9 (L) 11/01/2016   HCT 23.4 (L) 11/01/2016   MCV 93.6 11/01/2016   PLT 44 (L) 11/01/2016    Radiographic Findings: Ir Fluoro Guide Cv Line Right  Result Date: 10/05/2016 INDICATION: Cervical cancer EXAM: RIGHT UPPER EXTREMITY PICC LINE PLACEMENT WITH ULTRASOUND AND FLUOROSCOPIC GUIDANCE MEDICATIONS: None. ANESTHESIA/SEDATION: None. FLUOROSCOPY TIME:  Fluoroscopy Time:  minutes 6 seconds (0 mGy). COMPLICATIONS: None immediate.  PROCEDURE: The patient was advised of the possible risks and complications and agreed to undergo the procedure. The patient was then brought to the angiographic suite for the procedure. The right arm was prepped with chlorhexidine, draped in the usual sterile fashion using maximum barrier technique (cap and mask, sterile gown, sterile gloves, large sterile sheet, hand hygiene and cutaneous antiseptic). Local anesthesia  was attained by infiltration with 1% lidocaine. Ultrasound demonstrated patency of the basilic vein, and this was documented with an image. Under real-time ultrasound guidance, this vein was accessed with a 21 gauge micropuncture needle and image documentation was performed. The needle was exchanged over a guidewire for a peel-away sheath through which a 36 cm 5 [place] double lumen power injectable PICC was advanced, and positioned with its tip at the lower SVC/right atrial junction. Fluoroscopy during the procedure and fluoro spot radiograph confirms appropriate catheter position. The catheter was flushed, secured to the skin with Prolene sutures, and covered with a sterile dressing. IMPRESSION: Successful placement of a right arm PICC with sonographic and fluoroscopic guidance. The catheter is ready for use. Electronically Signed   By: Marybelle Killings M.D.   On: 10/05/2016 11:13   Ir US Guide Vasc Access Right  Result Date: 10/05/2016 INDICATION: Cervical cancer EXAM: RIGHT UPPER EXTREMITY PICC LINE PLACEMENT WITH ULTRASOUND AND FLUOROSCOPIC GUIDANCE MEDICATIONS: None. ANESTHESIA/SEDATION: None. FLUOROSCOPY TIME:  Fluoroscopy Time:  minutes 6 seconds (0 mGy). COMPLICATIONS: None immediate. PROCEDURE: The patient was advised of the possible risks and complications and agreed to undergo the procedure. The patient was then brought to the angiographic suite for the procedure. The right arm was prepped with chlorhexidine, draped in the usual sterile fashion using maximum barrier technique (cap and mask, sterile gown, sterile gloves, large sterile sheet, hand hygiene and cutaneous antiseptic). Local anesthesia was attained by infiltration with 1% lidocaine. Ultrasound demonstrated patency of the basilic vein, and this was documented with an image. Under real-time ultrasound guidance, this vein was accessed with a 21 gauge micropuncture needle and image documentation was performed. The needle was exchanged over a  guidewire for a peel-away sheath through which a 36 cm 5 [place] double lumen power injectable PICC was advanced, and positioned with its tip at the lower SVC/right atrial junction. Fluoroscopy during the procedure and fluoro spot radiograph confirms appropriate catheter position. The catheter was flushed, secured to the skin with Prolene sutures, and covered with a sterile dressing. IMPRESSION: Successful placement of a right arm PICC with sonographic and fluoroscopic guidance. The catheter is ready for use. Electronically Signed   By: Marybelle Killings M.D.   On: 10/05/2016 11:13    Impression: The patient was successfully fitted for a vaginal cylinder. The patient is appropriate to begin vaginal brachytherapy.  She tolerated the procedure well.  Plan: The patient will proceed with CT simulation and vaginal brachytherapy today.  Transfusion later today for hemoglobin issues.  _______________________________   Blair Promise, PhD, MD This document serves as a record of services personally performed by Gery Pray, MD. It was created on his behalf by Bethann Humble, a trained medical scribe. The creation of this record is based on the scribe's personal observations and the provider's statements to them. This document has been checked and approved by the attending provider.

## 2016-11-01 NOTE — Progress Notes (Signed)
[name] her efor follow up/hdr.  She reports having chronic pain at a 7/10 in lower back.  She has also noticed a knot on her left wrist that is red and tender to the touch.  She noticed it this weekend.  She also reports having some dysuria this morning and thinks it was due to drinking coffee.  She reports occasional diarrhea and takes Imodium as needed.   She reports having occasional nausea.  She reports feeling fatigued today.  She denies having any vaginal bleeding.  BP 117/80 (BP Location: Left Arm, Patient Position: Sitting)   Pulse 97   Temp 97.9 F (36.6 C) (Oral)   Ht 5\' 2"  (1.575 m)   Wt 145 lb (65.8 kg)   SpO2 100%   BMI 26.52 kg/m    Wt Readings from Last 3 Encounters:  11/01/16 145 lb (65.8 kg)  10/29/16 140 lb 9 oz (63.8 kg)  10/26/16 142 lb 9.6 oz (64.7 kg)

## 2016-11-01 NOTE — Progress Notes (Signed)
  Radiation Oncology         [phone]) [phone] ________________________________  Name: [name] MRN: [id number]  Date: 11/01/2016  DOB: 10/30/1951  CC: Horatio Pel, MD  Deland Pretty, MD  HDR BRACHYTHERAPY NOTE  DIAGNOSIS: Cervical cancer, FIGO stage IB2   Simple treatment device note: Patient had construction of her custom vaginal cylinder. She will be treated with a 3 cm diameter segmented cylinder. This conforms to her anatomy without undue discomfort.  Vaginal brachytherapy procedure node: The patient was brought to the [place] suite. Identity was confirmed. All relevant records and images related to the planned course of therapy were reviewed. The patient freely provided informed written consent to proceed with treatment after reviewing the details related to the planned course of therapy. The consent form was witnessed and verified by the simulation staff. Then, the patient was set-up in a stable reproducible supine position for radiation therapy. The patient's custom vaginal cylinder was placed in the proximal vagina. This was affixed to the CT/MR stabilization plate to prevent slippage. Patient tolerated the placement well.  Verification simulation note:  A fiducial marker was placed within the vaginal cylinder. An AP and lateral film was then obtained through the pelvis area. This documented accurate position of the vaginal cylinder for treatment.  HDR BRACHYTHERAPY TREATMENT  The remote afterloading device was affixed to the vaginal cylinder by catheter. Patient then proceeded to undergo her first high-dose-rate treatment directed at the proximal vagina. The patient was prescribed a dose of 6 gray to be delivered to the mucosal surface. Treatment length was 3 cm. Patient was treated with 1 channel using multiple dwell positions. Treatment time was 288.4 seconds. Iridium 192 was the high-dose-rate source for treatment. The patient tolerated the treatment well. After completion  of her therapy, a radiation survey was performed documenting return of the iridium source into the GammaMed safe.   PLAN: The patient will return next week for her second treatment. ________________________________  Blair Promise, PhD, MD  This document serves as a record of services personally performed by Gery Pray, MD. It was created on his behalf by Bethann Humble, a trained medical scribe. The creation of this record is based on the scribe's personal observations and the provider's statements to them. This document has been checked and approved by the attending provider.

## 2016-11-01 NOTE — Progress Notes (Signed)
Please see the Nurse Progress Note in the MD Initial Consult Encounter for this patient. 

## 2016-11-02 ENCOUNTER — Encounter: Payer: Self-pay | Admitting: Radiation Oncology

## 2016-11-02 ENCOUNTER — Other Ambulatory Visit: Payer: Self-pay | Admitting: Oncology

## 2016-11-02 ENCOUNTER — Ambulatory Visit (HOSPITAL_BASED_OUTPATIENT_CLINIC_OR_DEPARTMENT_OTHER): Payer: Medicare Other

## 2016-11-02 VITALS — BP 130/76 | HR 110 | Temp 98.4°F | Resp 20

## 2016-11-02 DIAGNOSIS — D701 Agranulocytosis secondary to cancer chemotherapy: Secondary | ICD-10-CM

## 2016-11-02 DIAGNOSIS — C530 Malignant neoplasm of endocervix: Secondary | ICD-10-CM | POA: Diagnosis not present

## 2016-11-02 DIAGNOSIS — Z452 Encounter for adjustment and management of vascular access device: Secondary | ICD-10-CM

## 2016-11-02 MED ORDER — TBO-FILGRASTIM 300 MCG/0.5ML ~~LOC~~ SOSY
300.0000 ug | PREFILLED_SYRINGE | Freq: Once | SUBCUTANEOUS | Status: AC
  Administered 2016-11-02: 300 ug via SUBCUTANEOUS
  Filled 2016-11-02: qty 0.5

## 2016-11-02 NOTE — Patient Instructions (Signed)
Filgrastim, G-CSF injection What is this medicine? FILGRASTIM, G-CSF (fil GRA stim) is a granulocyte colony-stimulating factor that stimulates the growth of neutrophils, a type of white blood cell (WBC) important in the body's fight against infection. It is used to reduce the incidence of fever and infection in patients with certain types of cancer who are receiving chemotherapy that affects the bone marrow, to stimulate blood cell production for removal of WBCs from the body prior to a bone marrow transplantation, to reduce the incidence of fever and infection in patients who have severe chronic neutropenia, and to improve survival outcomes following high-dose radiation exposure that is toxic to the bone marrow. This medicine may be used for other purposes; ask your health care provider or pharmacist if you have questions. COMMON BRAND NAME(S): Neupogen, Zarxio What should I tell my health care provider before I take this medicine? They need to know if you have any of these conditions: -kidney disease -latex allergy -ongoing radiation therapy -sickle cell disease -an unusual or allergic reaction to filgrastim, pegfilgrastim, other medicines, foods, dyes, or preservatives -pregnant or trying to get pregnant -breast-feeding How should I use this medicine? This medicine is for injection under the skin or infusion into a vein. As an infusion into a vein, it is usually given by a health care professional in a hospital or clinic setting. If you get this medicine at home, you will be taught how to prepare and give this medicine. Refer to the Instructions for Use that come with your medication packaging. Use exactly as directed. Take your medicine at regular intervals. Do not take your medicine more often than directed. It is important that you put your used needles and syringes in a special sharps container. Do not put them in a trash can. If you do not have a sharps container, call your pharmacist or  healthcare provider to get one. Talk to your pediatrician regarding the use of this medicine in children. While this drug may be prescribed for children as young as 7 months for selected conditions, precautions do apply. Overdosage: If you think you have taken too much of this medicine contact a poison control center or emergency room at once. NOTE: This medicine is only for you. Do not share this medicine with others. What if I miss a dose? It is important not to miss your dose. Call your doctor or health care professional if you miss a dose. What may interact with this medicine? This medicine may interact with the following medications: -medicines that may cause a release of neutrophils, such as lithium This list may not describe all possible interactions. Give your health care provider a list of all the medicines, herbs, non-prescription drugs, or dietary supplements you use. Also tell them if you smoke, drink alcohol, or use illegal drugs. Some items may interact with your medicine. What should I watch for while using this medicine? You may need blood work done while you are taking this medicine. What side effects may I notice from receiving this medicine? Side effects that you should report to your doctor or health care professional as soon as possible: -allergic reactions like skin rash, itching or hives, swelling of the face, lips, or tongue -dizziness or feeling faint -fever -pain, redness, or irritation at site where injected -pinpoint red spots on the skin -shortness of breath or breathing problems -signs and symptoms of kidney injury like trouble passing urine, change in the amount of urine, or red or dark-brown urine -stomach or side pain,   or pain at the shoulder -swelling -tiredness - unusual bleeding or bruising Side effects that usually do not require medical attention (report to your doctor or health care professional if they continue or are bothersome): -bone  pain -headache -muscle pain This list may not describe all possible side effects. Call your doctor for medical advice about side effects. You may report side effects to FDA at 1-800-FDA-1088. Where should I keep my medicine? Keep out of the reach of children. Store in a refrigerator between 2 and 8 degrees C (36 and 46 degrees F). Do not freeze. Keep in carton to protect from light. Throw away this medicine if vials or syringes are left out of the refrigerator for more than 24 hours. Throw away any unused medicine after the expiration date. NOTE: This sheet is a summary. It may not cover all possible information. If you have questions about this medicine, talk to your doctor, pharmacist, or health care provider.  2017 Elsevier/Gold Standard (2015-05-28 10:57:13)  

## 2016-11-03 ENCOUNTER — Ambulatory Visit (HOSPITAL_COMMUNITY)
Admission: RE | Admit: 2016-11-03 | Discharge: 2016-11-03 | Disposition: A | Discharge status: Home / Self Care | Payer: Medicare Other | Source: Ambulatory Visit | Attending: Oncology | Admitting: Oncology

## 2016-11-03 ENCOUNTER — Other Ambulatory Visit (HOSPITAL_BASED_OUTPATIENT_CLINIC_OR_DEPARTMENT_OTHER): Payer: Medicare Other

## 2016-11-03 DIAGNOSIS — C539 Malignant neoplasm of cervix uteri, unspecified: Secondary | ICD-10-CM

## 2016-11-03 DIAGNOSIS — T451X5A Adverse effect of antineoplastic and immunosuppressive drugs, initial encounter: Secondary | ICD-10-CM | POA: Diagnosis not present

## 2016-11-03 DIAGNOSIS — D6481 Anemia due to antineoplastic chemotherapy: Secondary | ICD-10-CM | POA: Diagnosis not present

## 2016-11-03 DIAGNOSIS — C530 Malignant neoplasm of endocervix: Secondary | ICD-10-CM

## 2016-11-03 DIAGNOSIS — D61810 Antineoplastic chemotherapy induced pancytopenia: Secondary | ICD-10-CM

## 2016-11-03 LAB — CBC WITH DIFFERENTIAL/PLATELET
BASO%: 0.4 % (ref 0.0–2.0)
BASOS ABS: 0 10*3/uL (ref 0.0–0.1)
EOS ABS: 0 10*3/uL (ref 0.0–0.5)
EOS%: 0.1 % (ref 0.0–7.0)
HEMATOCRIT: 23.7 % — AB (ref 34.8–46.6)
HEMOGLOBIN: 8 g/dL — AB (ref 11.6–15.9)
LYMPH#: 0.5 10*3/uL — AB (ref 0.9–3.3)
LYMPH%: 13 % — ABNORMAL LOW (ref 14.0–49.7)
MCH: 31.6 pg (ref 25.1–34.0)
MCHC: 33.6 g/dL (ref 31.5–36.0)
MCV: 94.1 fL (ref 79.5–101.0)
MONO#: 0.5 10*3/uL (ref 0.1–0.9)
MONO%: 12.7 % (ref 0.0–14.0)
NEUT#: 3 10*3/uL (ref 1.5–6.5)
NEUT%: 73.8 % (ref 38.4–76.8)
PLATELETS: 89 10*3/uL — AB (ref 145–400)
RBC: 2.52 10*6/uL — ABNORMAL LOW (ref 3.70–5.45)
RDW: 12.1 % (ref 11.2–14.5)
WBC: 4.1 10*3/uL (ref 3.9–10.3)

## 2016-11-03 MED ORDER — SODIUM CHLORIDE 0.9 % IV SOLN
250.0000 mL | Freq: Once | INTRAVENOUS | Status: AC
  Administered 2016-11-03: 250 mL via INTRAVENOUS

## 2016-11-03 MED ORDER — ACETAMINOPHEN 325 MG PO TABS
325.0000 mg | ORAL_TABLET | Freq: Once | ORAL | Status: AC
  Administered 2016-11-03: 325 mg via ORAL
  Filled 2016-11-03: qty 1

## 2016-11-03 NOTE — Progress Notes (Signed)
Patient ID: [name], female   DOB: 30-Jan-1951, 65 y.o.   MRN: [id number] Procedure: Transfusion of 1 unit PRBC via PIV.  Provider:Lennis Livesay MD  Associated Diagnosis:Cervical cancer, FIGO stage IB2 ([facility]) (C53.9); Anemia due to antineoplastic chemotherapy (D64.81 , T45.1X5A)  Tolerated transfusion well. Difficult IV access, but IV team obtained access. Went over discharge instructions and copy given to patient. Patient alert, oriented and ambulatory at time of discharge. Discharged home with daughter.

## 2016-11-03 NOTE — Discharge Instructions (Signed)

## 2016-11-04 LAB — TYPE AND SCREEN
ABO/RH(D): B POS
Antibody Screen: NEGATIVE
UNIT DIVISION: 0

## 2016-11-05 ENCOUNTER — Telehealth: Payer: Self-pay | Admitting: *Deleted

## 2016-11-05 NOTE — Telephone Encounter (Signed)
CALLED PATIENT TO REMIND OF HDR TX. FOR 11-08-16 @ 1 PM, NO ANSWER

## 2016-11-07 ENCOUNTER — Other Ambulatory Visit: Payer: Self-pay | Admitting: Oncology

## 2016-11-08 ENCOUNTER — Other Ambulatory Visit (HOSPITAL_BASED_OUTPATIENT_CLINIC_OR_DEPARTMENT_OTHER): Payer: Medicare Other

## 2016-11-08 ENCOUNTER — Ambulatory Visit (HOSPITAL_BASED_OUTPATIENT_CLINIC_OR_DEPARTMENT_OTHER): Payer: Medicare Other | Admitting: Oncology

## 2016-11-08 ENCOUNTER — Ambulatory Visit
Admission: RE | Admit: 2016-11-08 | Discharge: 2016-11-08 | Disposition: A | Discharge status: Home / Self Care | Payer: Medicare Other | Source: Ambulatory Visit | Attending: Radiation Oncology | Admitting: Radiation Oncology

## 2016-11-08 VITALS — BP 138/84 | HR 95 | Temp 98.0°F | Resp 18 | Ht 62.0 in | Wt 144.0 lb

## 2016-11-08 DIAGNOSIS — G8929 Other chronic pain: Secondary | ICD-10-CM | POA: Diagnosis not present

## 2016-11-08 DIAGNOSIS — D6481 Anemia due to antineoplastic chemotherapy: Secondary | ICD-10-CM | POA: Diagnosis not present

## 2016-11-08 DIAGNOSIS — Z452 Encounter for adjustment and management of vascular access device: Secondary | ICD-10-CM

## 2016-11-08 DIAGNOSIS — C539 Malignant neoplasm of cervix uteri, unspecified: Secondary | ICD-10-CM

## 2016-11-08 DIAGNOSIS — D6959 Other secondary thrombocytopenia: Secondary | ICD-10-CM

## 2016-11-08 DIAGNOSIS — R911 Solitary pulmonary nodule: Secondary | ICD-10-CM

## 2016-11-08 DIAGNOSIS — C530 Malignant neoplasm of endocervix: Secondary | ICD-10-CM

## 2016-11-08 DIAGNOSIS — I809 Phlebitis and thrombophlebitis of unspecified site: Secondary | ICD-10-CM

## 2016-11-08 DIAGNOSIS — M545 Low back pain: Secondary | ICD-10-CM | POA: Diagnosis not present

## 2016-11-08 DIAGNOSIS — Z510 Encounter for antineoplastic radiation therapy: Secondary | ICD-10-CM | POA: Diagnosis not present

## 2016-11-08 DIAGNOSIS — D701 Agranulocytosis secondary to cancer chemotherapy: Secondary | ICD-10-CM

## 2016-11-08 DIAGNOSIS — Z806 Family history of leukemia: Secondary | ICD-10-CM

## 2016-11-08 DIAGNOSIS — Z808 Family history of malignant neoplasm of other organs or systems: Secondary | ICD-10-CM

## 2016-11-08 DIAGNOSIS — E039 Hypothyroidism, unspecified: Secondary | ICD-10-CM | POA: Diagnosis not present

## 2016-11-08 DIAGNOSIS — T451X5A Adverse effect of antineoplastic and immunosuppressive drugs, initial encounter: Secondary | ICD-10-CM

## 2016-11-08 DIAGNOSIS — Z90722 Acquired absence of ovaries, bilateral: Secondary | ICD-10-CM | POA: Diagnosis not present

## 2016-11-08 DIAGNOSIS — Z809 Family history of malignant neoplasm, unspecified: Secondary | ICD-10-CM

## 2016-11-08 DIAGNOSIS — Z90710 Acquired absence of both cervix and uterus: Secondary | ICD-10-CM | POA: Diagnosis not present

## 2016-11-08 LAB — CBC WITH DIFFERENTIAL/PLATELET
BASO%: 0.2 % (ref 0.0–2.0)
BASOS ABS: 0 10*3/uL (ref 0.0–0.1)
EOS ABS: 0 10*3/uL (ref 0.0–0.5)
EOS%: 0.1 % (ref 0.0–7.0)
HEMATOCRIT: 27.7 % — AB (ref 34.8–46.6)
HGB: 9.3 g/dL — ABNORMAL LOW (ref 11.6–15.9)
LYMPH#: 0.6 10*3/uL — AB (ref 0.9–3.3)
LYMPH%: 28.2 % (ref 14.0–49.7)
MCH: 31.5 pg (ref 25.1–34.0)
MCHC: 33.7 g/dL (ref 31.5–36.0)
MCV: 93.6 fL (ref 79.5–101.0)
MONO#: 0.5 10*3/uL (ref 0.1–0.9)
MONO%: 23.8 % — ABNORMAL HIGH (ref 0.0–14.0)
NEUT#: 1 10*3/uL — ABNORMAL LOW (ref 1.5–6.5)
NEUT%: 47.7 % (ref 38.4–76.8)
PLATELETS: 156 10*3/uL (ref 145–400)
RBC: 2.96 10*6/uL — ABNORMAL LOW (ref 3.70–5.45)
RDW: 12.5 % (ref 11.2–14.5)
WBC: 2.2 10*3/uL — ABNORMAL LOW (ref 3.9–10.3)

## 2016-11-08 MED ORDER — TBO-FILGRASTIM 300 MCG/0.5ML ~~LOC~~ SOSY
300.0000 ug | PREFILLED_SYRINGE | Freq: Once | SUBCUTANEOUS | Status: AC
  Administered 2016-11-08: 300 ug via SUBCUTANEOUS
  Filled 2016-11-08: qty 0.5

## 2016-11-08 NOTE — Progress Notes (Signed)
  Radiation Oncology         [phone]) [phone] ________________________________  Name: [name] MRN: [id number]  Date: 11/08/2016  DOB: [birth year]  CC: Horatio Pel, MD  Deland Pretty, MD  HDR BRACHYTHERAPY NOTE  DIAGNOSIS: Cervical cancer, FIGO stage IB2   Simple treatment device note: Patient had construction of her custom vaginal cylinder. She will be treated with a 3 cm diameter segmented cylinder. This conforms to her anatomy without undue discomfort.  Vaginal brachytherapy procedure node: The patient was brought to the [place] suite. Identity was confirmed. All relevant records and images related to the planned course of therapy were reviewed. The patient freely provided informed written consent to proceed with treatment after reviewing the details related to the planned course of therapy. The consent form was witnessed and verified by the simulation staff. Then, the patient was set-up in a stable reproducible supine position for radiation therapy. The patient's custom vaginal cylinder was placed in the proximal vagina. This was affixed to the CT/MR stabilization plate to prevent slippage. Patient tolerated the placement well.  Verification simulation note:  A fiducial marker was placed within the vaginal cylinder. An AP and lateral film was then obtained through the pelvis area. This documented accurate position of the vaginal cylinder for treatment.  HDR BRACHYTHERAPY TREATMENT  The remote afterloading device was affixed to the vaginal cylinder by catheter. Patient then proceeded to undergo her second high-dose-rate treatment directed at the proximal vagina. The patient was prescribed a dose of 6 gray to be delivered to the mucosal surface. Treatment length was 3 cm. Patient was treated with 1 channel using multiple dwell positions. Treatment time was 308.1 seconds. Iridium 192 was the high-dose-rate source for treatment. The patient tolerated the treatment well. After  completion of her therapy, a radiation survey was performed documenting return of the iridium source into the GammaMed safe.   PLAN: The patient will return Wed for her third treatment. ________________________________  Blair Promise, PhD, MD  This document serves as a record of services personally performed by Gery Pray, MD. It was created on his behalf by Bethann Humble, a trained medical scribe. The creation of this record is based on the scribe's personal observations and the provider's statements to them. This document has been checked and approved by the attending provider.

## 2016-11-08 NOTE — Progress Notes (Signed)
OFFICE PROGRESS NOTE   November 08, 2016   Physicians: Anselm Jungling, MD(PCP), Ocie Cornfield, Renie Ora with [facility], Glenna Fellows), J.Kinard , Oretha Milch  INTERVAL HISTORY:  Patient is seen, alone for visit, in follow up of pancytopenia related to pelvic radiation and sensitizing CDDP for IB2 endocervical adenocarcinoma. She has not had fever or bleeding despite low counts.  Last CDDP was #4 on 10-18-16; pelvic radiation completed 10-28-16. She had granix 12-9, 12-11, 12-12 and 1 unit PRBCs on 11-03-16. She is for HDR today and 11-10-16.   Patient is still quite fatigued, but again denies fever, bleeding, SOB with present activity, or chest pain. She has not had severe aches with granix. She completed Keflex on 12-16, used after PICC removal due to neutropenia. PICC insertion site did not bleed and has not been uncomfortable. She has new area of slight phlebitis at site of peripheral venous access left wrist, for 1-2 days. Radiation diarrhea has resolved, no BM today and likely needs to resume previous bowel regimen as she needed for previous constipation from chronic narcotics. No skin soreness in previous radiation area. She is eating a little better. No change in chronic pain. Hair is thinning. Remainder of 10 point Review of Systems negative.   PICC was DCd on [id number] without complication, despite platelets 30k and ANC 0.4 that day. Flu vaccine 08-06-16 No genetics counseling (Note family history osteosarcoma, leukemia, colon ca). Family history also hairy cell leukemia in sister.  ONCOLOGIC HISTORY  Patient had intermittent vaginal spotting beginning ~ 04-2016. She had sonohystogram by Dr Matthew Saras 06-01-16 with 2.4 cm endometrial polyp identified. 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 She saw Dr Denman George in consultation, then had robotic assisted TAH BSO sentinel node on 08-05-16. Surgical pathology  [phone] invasive adenocarcinoma of endocervix with isolated tumor cells in right external iliac node and micromet in left external iliac node. Review of path confirms endocervical primary without synchronous endometrial carcinoma. PET 08-31-16 no obvious metastatic disease, 8 mm RUL lung nodule will be followed up on next scans. Recommendation is radiation with sensitizing chemotherapy. First CDDP given 09-27-16. Second CDDP delayed due to inadequate venous access, resumed 11-15 after PICC. Cycle 4 CDDP given 10-18-16, then last planned chemo held due to neutropenia and thrombocytopenia. IMRT completed 10-28-16.   Objective:  Vital signs in last 24 hours:  BP 138/84 (BP Location: Left Arm, Patient Position: Sitting)   Pulse 95   Temp 98 F (36.7 C) (Oral)   Resp 18   Ht 5\' 2"  (1.575 m)   Wt 144 lb (65.3 kg)   SpO2 99%   BMI 26.34 kg/m  Weight up 4 lbs Alert, oriented and appropriate. Ambulatory without assistance .  Losing some hair but no obvious alopecia  HEENT:PERRL, sclerae not icteric. Oral mucosa moist without lesions, posterior pharynx clear.  Neck supple. No JVD.  Lymphatics:no cervical,supraclavicular  inguinal adenopathy Resp: clear to auscultation bilaterally and normal percussion bilaterally Cardio: regular rate and rhythm. No gallop. GI: abdomen soft, nontender, not distended, no mass or organomegaly. A few bowel sounds.  Musculoskeletal/ Extremities: LE without pitting edema, cords, tenderness. Left wrist with 2 cm slightly tender, slightly erythematous area on vein, no swelling or streaking.  Neuro: no change baseline peripheral neuropathy. Otherwise nonfocal PSYCH appropriate mood and affect Skin without rash, ecchymosis, petechiae. Insertion site for previous PICC right upper arm barely visible, no tenderness or swelling.   Lab Results:  Results for orders placed or performed in visit on 11/08/16  CBC with Differential  Result Value Ref Range   WBC 2.2 (L) 3.9 -  10.3 10e3/uL   NEUT# 1.0 (L) 1.5 - 6.5 10e3/uL   HGB 9.3 (L) 11.6 - 15.9 g/dL   HCT 27.7 (L) 34.8 - 46.6 %   Platelets 156 145 - 400 10e3/uL   MCV 93.6 79.5 - 101.0 fL   MCH 31.5 25.1 - 34.0 pg   MCHC 33.7 31.5 - 36.0 g/dL   RBC 2.96 (L) 3.70 - 5.45 10e6/uL   RDW 12.5 11.2 - 14.5 %   lymph# 0.6 (L) 0.9 - 3.3 10e3/uL   MONO# 0.5 0.1 - 0.9 10e3/uL   Eosinophils Absolute 0.0 0.0 - 0.5 10e3/uL   Basophils Absolute 0.0 0.0 - 0.1 10e3/uL   NEUT% 47.7 38.4 - 76.8 %   LYMPH% 28.2 14.0 - 49.7 %   MONO% 23.8 (H) 0.0 - 14.0 %   EOS% 0.1 0.0 - 7.0 %   BASO% 0.2 0.0 - 2.0 %   Hgb up from 8.4 with transfusion   Studies/Results:  No results found.  Medications: I have reviewed the patient's current medications. One additional granix 300 today  DISCUSSION Platelets a little better, usually first to recover. Granix today, call if fever or symsptoms of infection or any bleeding. Repeat lab 1-8 or sooner if concerns.   She is overdue mammograms, last at Dr [facility] office. She is for bone density scan at his office 11-11-16, and patient will request mammograms also if possible.   She has never had colonoscopy, is willing to have this, will need to know correct timing for this after radiation.   She would like to meet with genetics counselors (see family history above).  Patient is aware that another medical oncologist will be working with gyn oncology team after Jan. After counts recover, as long as she has had good response from treatment, follow up likely will be primarily with gyn oncology and radiation oncology. Note she needs follow up of lung nodule with next imaging  Slight phlebitis at IV access site left wrist. Warm soaks and OK for NSAID with food for next few days. Just finished Keflex for another indication. Call if worse or does not resolve.  Assessment/Plan:  1. IB2 endocervical adenocarcinoma: TAH BSO sentinel nodes 08-05-16,  isolated tumor cells right external iliac and  micromet in left external iliac, PET 08-31-16 without metastatic disease. Sensitizing CDDP given x 4 From 09-27-16 thru 10-18-16, cycle 5 not given with neutropenia and thrombocytopenia. IMRT completed 10-28-16, with brachytherapy in process.  I will see for follow up counts early Jan. 2. Neutropenia, thrombocytopenia and anemia related to chemo and pelvic radiation: Transfused PRBCs x1 unit 12-13, ANC at neutropenic level again today so will give additional granix, platelets recovered. Plan as above. 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 On chronic pain meds by pain management clinic. 5.PICC placed 10-05-16, DCd 10-29-16 to reduce risk of infection with neutropenia then, fortunately tolerated removal without difficulty.. If she needs additional chemo beyond sensitizing CDDP, willneed PAC. 6.family history osteosarcoma in son, leukemia mother and hairy cell leukemia in sister, colon cancer . Genetics counseling requested. 7.post extractions of 10 teeth by Dr Enrique Sack 09-08-16, healed. Still caries that need to be addressed by dentist of choice after chemo/ RT, patient aware. 8. 8 mm RUL pulmonary nodule on PET/ CT, will  need to follow up with next scans. Question of nodular opacity left lung base by pre op CXR. Note pneumonia 2015 was RLL per xray reports. 9.overdue mammograms, last at Dr [facility] office ~ 2 years ago. She will ask to have these with bone density scheduled at that office 11-11-16 10.never colonoscopy :  See above 11.osteoporosis per patient, no DEXA in this EMR. For bone density at Dr [facility] office 11-11-16 12.flu vaccine 08-06-16  All questions answered and patient knows to call if concerns prior to next scheduled visit. Time spent 25 min including >50% counseling and coordination of care. CC Drs Denman George, Peggye Ley, [place], Andree Elk    Evlyn Clines, MD   11/08/2016, 6:24  PM

## 2016-11-09 ENCOUNTER — Encounter: Payer: Self-pay | Admitting: Oncology

## 2016-11-09 ENCOUNTER — Telehealth: Payer: Self-pay | Admitting: *Deleted

## 2016-11-09 NOTE — Telephone Encounter (Signed)
Called patient to remind of HDR Tx. For 11-10-16, no answer

## 2016-11-10 ENCOUNTER — Ambulatory Visit
Admission: RE | Admit: 2016-11-10 | Discharge: 2016-11-10 | Disposition: A | Discharge status: Home / Self Care | Payer: Medicare Other | Source: Ambulatory Visit | Attending: Radiation Oncology | Admitting: Radiation Oncology

## 2016-11-10 DIAGNOSIS — Z510 Encounter for antineoplastic radiation therapy: Secondary | ICD-10-CM | POA: Diagnosis not present

## 2016-11-10 DIAGNOSIS — M545 Low back pain: Secondary | ICD-10-CM | POA: Diagnosis not present

## 2016-11-10 DIAGNOSIS — C530 Malignant neoplasm of endocervix: Secondary | ICD-10-CM | POA: Diagnosis not present

## 2016-11-10 DIAGNOSIS — C539 Malignant neoplasm of cervix uteri, unspecified: Secondary | ICD-10-CM

## 2016-11-10 DIAGNOSIS — G8929 Other chronic pain: Secondary | ICD-10-CM | POA: Diagnosis not present

## 2016-11-10 DIAGNOSIS — Z90722 Acquired absence of ovaries, bilateral: Secondary | ICD-10-CM | POA: Diagnosis not present

## 2016-11-10 DIAGNOSIS — Z90710 Acquired absence of both cervix and uterus: Secondary | ICD-10-CM | POA: Diagnosis not present

## 2016-11-10 NOTE — Progress Notes (Signed)
  Radiation Oncology         ([phone]) [phone] ________________________________  Name: [name] MRN: [id number]  Date: 11/10/2016  DOB: 09/29/1951  CC: Horatio Pel, MD  Deland Pretty, MD  HDR BRACHYTHERAPY NOTE  DIAGNOSIS: Cervical cancer, FIGO stage IB2   Simple treatment device note: Patient had construction of her custom vaginal cylinder. She will be treated with a 3 cm diameter segmented cylinder. This conforms to her anatomy without undue discomfort.  Vaginal brachytherapy procedure node: The patient was brought to the [place] suite. Identity was confirmed. All relevant records and images related to the planned course of therapy were reviewed. The patient freely provided informed written consent to proceed with treatment after reviewing the details related to the planned course of therapy. The consent form was witnessed and verified by the simulation staff. Then, the patient was set-up in a stable reproducible supine position for radiation therapy. Pelvic examination revealed the vaginal cuff to be intact, no pelvic masses appreciated .The patient's custom vaginal cylinder was placed in the proximal vagina. This was affixed to the CT/MR stabilization plate to prevent slippage. Patient tolerated the placement well.  Verification simulation note:  A fiducial marker was placed within the vaginal cylinder. An AP and lateral film was then obtained through the pelvis area. This documented accurate position of the vaginal cylinder for treatment.  HDR BRACHYTHERAPY TREATMENT  The remote afterloading device was affixed to the vaginal cylinder by catheter. Patient then proceeded to undergo her third high-dose-rate treatment directed at the proximal vagina. The patient was prescribed a dose of 6 gray to be delivered to the mucosal surface. Treatment length was 3 cm. Patient was treated with 1 channel using multiple dwell positions. Treatment time was 313.9 seconds. Iridium 192 was the  high-dose-rate source for treatment. The patient tolerated the treatment well. After completion of her therapy, a radiation survey was performed documenting return of the iridium source into the [employer] safe.   PLAN: Routine follow up with radiation oncology in 1 month. ________________________________  Blair Promise, PhD, MD   This document serves as a record of services personally performed by Gery Pray, MD. It was created on his behalf by Bethann Humble, a trained medical scribe. The creation of this record is based on the scribe's personal observations and the provider's statements to them. This document has been checked and approved by the attending provider.

## 2016-11-11 ENCOUNTER — Encounter: Payer: Self-pay | Admitting: Radiation Oncology

## 2016-11-11 DIAGNOSIS — M816 Localized osteoporosis [Lequesne]: Secondary | ICD-10-CM | POA: Diagnosis not present

## 2016-11-11 DIAGNOSIS — Z1231 Encounter for screening mammogram for malignant neoplasm of breast: Secondary | ICD-10-CM | POA: Diagnosis not present

## 2016-11-11 DIAGNOSIS — N958 Other specified menopausal and perimenopausal disorders: Secondary | ICD-10-CM | POA: Diagnosis not present

## 2016-11-11 NOTE — Progress Notes (Signed)
  Radiation Oncology         [phone]) [phone] ________________________________  Name: [name] MRN: [id number]  Date: 11/11/2016  DOB: Jan 15, 1951  End of Treatment Note  Diagnosis:   Stage IIIB (pT1b2, pN1, pMX) endocervical adenocarcinoma   Indication for treatment:  Curative       Radiation treatment dates:   09/23/16 - 10/28/16  ;  11/01/16 - 11/10/16  Site/dose:   The Pelvis was treated to 45 Gy in 25 fractions of 1.8 Gy. The patient then underwent vaginal brachytherapy, and the Vaginal Cuff was treated to 18 Gy in 3 fractions of 6 Gy.  Beams/energy:   Pelvis : IMRT  //  6X        VagCuff : HDR-Vaginal  //  Iridium HDR  Narrative: The patient tolerated radiation treatment relatively well. She reported experiencing chronic back pain, which she most recently rated 7/10 in severity, and experienced relief with Percocet. She also reported fatigue causing her to rest throughout the day. She experienced some diarrhea which was well managed with Imodium.  Plan: The patient has completed radiation treatment. The patient will return to radiation oncology clinic for routine followup in one month. I advised them to call or return sooner if they have any questions or concerns related to their recovery or treatment.  -----------------------------------  Blair Promise, PhD, MD  This document serves as a record of services personally performed by Gery Pray, MD. It was created on his behalf by Maryla Morrow, a trained medical scribe. The creation of this record is based on the scribe's personal observations and the provider's statements to them. This document has been checked and approved by the attending provider.

## 2016-11-23 ENCOUNTER — Ambulatory Visit: Payer: Self-pay

## 2016-11-24 ENCOUNTER — Other Ambulatory Visit: Payer: Self-pay | Admitting: Oncology

## 2016-11-24 DIAGNOSIS — C539 Malignant neoplasm of cervix uteri, unspecified: Secondary | ICD-10-CM

## 2016-11-29 ENCOUNTER — Ambulatory Visit (HOSPITAL_BASED_OUTPATIENT_CLINIC_OR_DEPARTMENT_OTHER): Payer: Medicare Other | Admitting: Oncology

## 2016-11-29 ENCOUNTER — Other Ambulatory Visit (HOSPITAL_BASED_OUTPATIENT_CLINIC_OR_DEPARTMENT_OTHER): Payer: Medicare Other

## 2016-11-29 VITALS — BP 114/76 | HR 89 | Temp 97.9°F | Resp 17 | Ht 62.0 in | Wt 144.4 lb

## 2016-11-29 DIAGNOSIS — D6959 Other secondary thrombocytopenia: Secondary | ICD-10-CM

## 2016-11-29 DIAGNOSIS — C539 Malignant neoplasm of cervix uteri, unspecified: Secondary | ICD-10-CM

## 2016-11-29 DIAGNOSIS — C530 Malignant neoplasm of endocervix: Secondary | ICD-10-CM

## 2016-11-29 DIAGNOSIS — M7989 Other specified soft tissue disorders: Secondary | ICD-10-CM | POA: Diagnosis not present

## 2016-11-29 DIAGNOSIS — D6481 Anemia due to antineoplastic chemotherapy: Secondary | ICD-10-CM

## 2016-11-29 DIAGNOSIS — T451X5A Adverse effect of antineoplastic and immunosuppressive drugs, initial encounter: Secondary | ICD-10-CM

## 2016-11-29 DIAGNOSIS — D701 Agranulocytosis secondary to cancer chemotherapy: Secondary | ICD-10-CM

## 2016-11-29 DIAGNOSIS — R911 Solitary pulmonary nodule: Secondary | ICD-10-CM | POA: Diagnosis not present

## 2016-11-29 DIAGNOSIS — G8921 Chronic pain due to trauma: Secondary | ICD-10-CM | POA: Diagnosis not present

## 2016-11-29 LAB — COMPREHENSIVE METABOLIC PANEL
ALBUMIN: 3.8 g/dL (ref 3.5–5.0)
ALT: 20 U/L (ref 0–55)
ANION GAP: 10 meq/L (ref 3–11)
AST: 26 U/L (ref 5–34)
Alkaline Phosphatase: 102 U/L (ref 40–150)
BILIRUBIN TOTAL: 0.33 mg/dL (ref 0.20–1.20)
BUN: 13.6 mg/dL (ref 7.0–26.0)
CO2: 27 mEq/L (ref 22–29)
CREATININE: 0.9 mg/dL (ref 0.6–1.1)
Calcium: 9.4 mg/dL (ref 8.4–10.4)
Chloride: 107 mEq/L (ref 98–109)
EGFR: 67 mL/min/{1.73_m2} — ABNORMAL LOW (ref >90)
GLUCOSE: 104 mg/dL (ref 70–140)
Potassium: 4 mEq/L (ref 3.5–5.1)
SODIUM: 144 meq/L (ref 136–145)
TOTAL PROTEIN: 6.4 g/dL (ref 6.4–8.3)

## 2016-11-29 LAB — CBC WITH DIFFERENTIAL/PLATELET
BASO%: 1.1 % (ref 0.0–2.0)
Basophils Absolute: 0 10*3/uL (ref 0.0–0.1)
EOS%: 1.1 % (ref 0.0–7.0)
Eosinophils Absolute: 0 10*3/uL (ref 0.0–0.5)
HCT: 31.1 % — ABNORMAL LOW (ref 34.8–46.6)
HEMOGLOBIN: 10.5 g/dL — AB (ref 11.6–15.9)
LYMPH%: 25.4 % (ref 14.0–49.7)
MCH: 32.5 pg (ref 25.1–34.0)
MCHC: 33.8 g/dL (ref 31.5–36.0)
MCV: 96.3 fL (ref 79.5–101.0)
MONO#: 0.6 10*3/uL (ref 0.1–0.9)
MONO%: 17.5 % — ABNORMAL HIGH (ref 0.0–14.0)
NEUT%: 54.9 % (ref 38.4–76.8)
NEUTROS ABS: 2 10*3/uL (ref 1.5–6.5)
PLATELETS: 158 10*3/uL (ref 145–400)
RBC: 3.23 10*6/uL — AB (ref 3.70–5.45)
RDW: 15.2 % — AB (ref 11.2–14.5)
WBC: 3.6 10*3/uL — AB (ref 3.9–10.3)
lymph#: 0.9 10*3/uL (ref 0.9–3.3)

## 2016-11-29 NOTE — Progress Notes (Signed)
OFFICE PROGRESS NOTE   November 29, 2016   Physicians: Anselm Jungling, MD(PCP), Ocie Cornfield, Renie Ora with [facility], Glenna Fellows), J.Kinard , Oretha Milch  INTERVAL HISTORY:  Patient is seen, alone for visit, in follow up of sensitizing CDDP used with radiation for IB2 endocervical adenocarcinoma, treatment requiring PICC line and complicated by cytopenias. Last CDDP was 10-18-16, pelvic radiation complete 10-28-16 and HDR completed 11-10-16.  She will see Dr Sondra Come on 12-08-16  Patient is feeling progressively better out from treatment, with improved energy and appetite. She has noticed some minimal swelling right hand and forearm x a few weeks which has not been increasing, no pain or redness associated. PICC was in RUE 11-14 thru 10-28-16.   Radiation diarrhea has resolved, now again constipation related to chronic pain medication. She denies SOB, chest discomfort, change in chronic pain back and legs, bleeding, LE swelling, fever or symptoms of infection.  Remainder of 14 point Review of Systems negative/ unchanged.   Flu vaccine 08-06-16 Genetics counseling planned 01-17-17. Family history osteosarcoma (son), leukemia, colon ca,  hairy cell leukemia (sister)  ONCOLOGIC HISTORY Patient had intermittent vaginal spotting beginning ~ 04-2016. She had sonohystogram by Dr Matthew Saras 06-01-16 with 2.4 cm endometrial polyp identified. 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 She saw Dr Denman George in consultation, then had robotic assisted TAH BSO sentinel node on 08-05-16. Surgical pathology [phone] invasive adenocarcinoma of endocervix with isolated tumor cells in right external iliac node and micromet in left external iliac node. Review of path confirms endocervical primary without synchronous endometrial carcinoma. PET 08-31-16 no obvious metastatic disease, 8 mm RUL lung nodule will be followed up on next scans.  Recommendation is radiation with sensitizing chemotherapy. First CDDP given 09-27-16. Second CDDP delayed due to inadequate venous access, resumed 11-15 after PICC. Cycle 4 CDDP given 10-18-16, then last planned chemo held due to neutropenia and thrombocytopenia. IMRT 45 Gy to pelvis thru 10-28-16  and vaginal cuff 18Gy in 3 fractions thru 11-10-16  .  Objective:  Vital signs in last 24 hours:  BP 114/76 (BP Location: Left Arm, Patient Position: Sitting)   Pulse 89   Temp 97.9 F (36.6 C) (Oral)   Resp 17   Ht _0  (1.575 m)   Wt 144 lb 6.4 oz (65.5 kg)   SpO2 98%   BMI 26.41 kg/m  Weight stable Alert, oriented and appropriate, looks more comfortable. Ambulatory with cane.  No alopecia  HEENT:PERRL, sclerae not icteric. Oral mucosa moist without lesions, posterior pharynx clear.  Neck supple. No JVD.  Lymphatics:no supraclavicular or inguinal adenopathy Resp: clear to auscultation bilaterally and normal percussion bilaterally Cardio: regular rate and rhythm. No gallop. GI: soft, nontender, not distended, no mass or organomegaly. Normally active bowel sounds.  Musculoskeletal/ Extremities :  No obvious swelling in right hand or fingers, trace puffiness without erythema, tenderness or cord right lower forearm lateraly. Site of PICC insertion right upper arm closed and not tender, No Swelling upper arm or right supraclavicular.LE without pitting edema, cords, tenderness Neuro: speech fluent, no changes. PSYCH appropriate mood and affect Skin without rash, ecchymosis, petechiae   Lab Results:  Results for orders placed or performed in visit on 11/29/16  CBC with Differential  Result Value Ref Range   WBC 3.6 (L) 3.9 - 10.3 10e3/uL   NEUT# 2.0 1.5 - 6.5 10e3/uL   HGB 10.5 (L) 11.6 - 15.9 g/dL   HCT 31.1 (L)  34.8 - 46.6 %   Platelets 158 145 - 400 10e3/uL   MCV 96.3 79.5 - 101.0 fL   MCH 32.5 25.1 - 34.0 pg   MCHC 33.8 31.5 - 36.0 g/dL   RBC 3.23 (L) 3.70 - 5.45 10e6/uL   RDW  15.2 (H) 11.2 - 14.5 %   lymph# 0.9 0.9 - 3.3 10e3/uL   MONO# 0.6 0.1 - 0.9 10e3/uL   Eosinophils Absolute 0.0 0.0 - 0.5 10e3/uL   Basophils Absolute 0.0 0.0 - 0.1 10e3/uL   NEUT% 54.9 38.4 - 76.8 %   LYMPH% 25.4 14.0 - 49.7 %   MONO% 17.5 (H) 0.0 - 14.0 %   EOS% 1.1 0.0 - 7.0 %   BASO% 1.1 0.0 - 2.0 %  Comprehensive metabolic panel  Result Value Ref Range   Sodium 144 136 - 145 mEq/L   Potassium 4.0 3.5 - 5.1 mEq/L   Chloride 107 98 - 109 mEq/L   CO2 27 22 - 29 mEq/L   Glucose 104 70 - 140 mg/dl   BUN 13.6 7.0 - 26.0 mg/dL   Creatinine 0.9 0.6 - 1.1 mg/dL   Total Bilirubin 0.33 0.20 - 1.20 mg/dL   Alkaline Phosphatase 102 40 - 150 U/L   AST 26 5 - 34 U/L   ALT 20 0 - 55 U/L   Total Protein 6.4 6.4 - 8.3 g/dL   Albumin 3.8 3.5 - 5.0 g/dL   Calcium 9.4 8.4 - 10.4 mg/dL   Anion Gap 10 3 - 11 mEq/L   EGFR 67 (L) >90 ml/min/1.73 m2   Labs reviewed with patient, counts improving.   Studies/Results:  PET 08-31-16 had 8 mm nodule RUL / nothing in left lung base as questioned on CXR 07-2016.  Medications: I have reviewed the patient's current medications.  DISCUSSION  Patient will elevate right arm and hand when possible, and will let us know if any increased swelling or other changes.   Message sent to gyn oncology for scheduling back to that clinic. Expect scans to be done for gyn oncology follow up, include CT chest to follow up 8 mm RUL nodule seen on PET10-10-17.  Patient is aware of recommendation for follow up CT chest.  mammograms and bone density done at Dr [facility] office mid Dec 2017 per patient. She is to go back for blood work at Dr [employer] office, believes this will be Vit D level 10.never colonoscopy :  See above  Assessment/Plan:  1. IB2 endocervical adenocarcinoma: TAH BSO sentinel nodes 08-05-16,  isolated tumor cells right external iliac and micromet in left external iliac, PET 08-31-16 without metastatic disease. Sensitizing CDDP given x 4 From 09-27-16  thru 10-18-16, cycle 5 not given with neutropenia and thrombocytopenia. IMRT and vaginal brachytherapy completed.  Counts improving. She will see Dr Sondra Come 12-08-16. Gyn onc as above. She can be seen back as needed by medical oncology. She will have blood work at routine exam with PCP. 2. Neutropenia, thrombocytopenia and anemia related to chemo and pelvic radiation: Transfused PRBCs x1 unit 12-13. Counts all improving.  3.slight swelling right hand and forearm x > 2 weeks, not worse. Elevate. Patient to let us know if any concerns ongoing. 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 On chronic pain meds by pain management clinic. 5.PICC placed RUE 10-05-16, DCd 10-29-16  If she needs additional chemo beyond sensitizing CDDP, willneed PAC. 6.family history osteosarcoma in son, leukemia mother and hairy cell leukemia in sister, colon cancer .  Genetics counseling planned. 7.post extractions of 10 teeth by Dr Enrique Sack 09-08-16, healed. Still caries that need to be addressed by dentist of choice after chemo/ RT, patient aware. 8. 8 mm RUL pulmonary nodule on PET/ CT, will need to follow up with next scans. Question of nodular opacity left lung base by pre op CXR not mentioned on PET images. Note pneumonia 2015 was RLL per xray reports. 9.mammograms and bone density done at Dr [facility] office mid Dec 2017 per patient. She is to go back for blood work at Dr [employer] office, believes this will be Vit D level 10.never colonoscopy :  See above 11.flu vaccine 08-06-16   All questions answered and patient is in agreement with recommendations and plans above. Time spent 20 min including >50% counseling and coordination of care. Route Dr Shelia Media, cc Drs Sondra Come, Aris [place], Andree Elk   Evlyn Clines, MD   11/29/2016, 5:16 PM

## 2016-12-01 ENCOUNTER — Encounter: Payer: Self-pay | Admitting: Oncology

## 2016-12-03 ENCOUNTER — Encounter: Payer: Self-pay | Admitting: *Deleted

## 2016-12-08 ENCOUNTER — Ambulatory Visit: Admission: RE | Admit: 2016-12-08 | Payer: Medicare Other | Source: Ambulatory Visit | Admitting: Radiation Oncology

## 2016-12-14 ENCOUNTER — Ambulatory Visit
Admission: RE | Admit: 2016-12-14 | Discharge: 2016-12-14 | Disposition: A | Discharge status: Home / Self Care | Payer: Medicare Other | Source: Ambulatory Visit | Attending: Radiation Oncology | Admitting: Radiation Oncology

## 2016-12-14 ENCOUNTER — Telehealth: Payer: Self-pay | Admitting: Oncology

## 2016-12-14 NOTE — Progress Notes (Signed)
  Home Care Instructions for the Insertion and Care of Your Vaginal Dilator  Why Do I Need a Vaginal Dilator?  Internal radiation therapy may cause scar tissue to form at the top of your vagina (vaginal cuff).  This may make vaginal examinations difficult in the future. You can prevent scar tissue from forming by using a vaginal dilator (a smooth plastic rod), and/or by having regular sexual intercourse.  If not using the dilator you should be having intercourse two or three times a week.  If you are unable to have intercourse, you should use your vaginal dilator.  You may have some spotting or bleeding from your dilator or intercourse the first few times. You may also have some discomfort. If discomfort occurs with intercourse, you and your partner may need to stop for a while and try again later.  How to Use Your Vaginal Dilator  - Use the dilator three time a week for 10 minutes at a time (for example: use for 10 minutes on Monday, Wednesday and Friday evenings). - Wash the dilator with soap and water before and after each use. - Check the dilator to be sure it is smooth. Do not use the dilator if you find any roughspots. - Coat the dilator with K-Y Jelly, Astroglide, or Replens. Do not use Vaseline, baby oil, or other oil based lubricants. They are not water-soluble and can be irritating to the tissues in the vagina. - Lie on your back with your knees bent and legs apart. - Insert the rounded end of the dilator into your vagina as far as it will go without causing pain or discomfort. - Close your knees and slowly straighten your legs. - Keep the dilator in your vagina for about 10 to 15 minutes. [facility] your knees, open your legs, and gently remove the dilator. - Gently cleanse the skin around the vaginal opening. - Wash the dilator after each use. -  It is important that you use the dilator routinely until instructed otherwise by your doctor.

## 2016-12-14 NOTE — Telephone Encounter (Signed)
Attempted to call patient about follow up appointment.  Her voice mail is not set up.

## 2016-12-22 ENCOUNTER — Encounter: Payer: Self-pay | Admitting: Oncology

## 2016-12-22 NOTE — Progress Notes (Signed)
Medical Oncology  Message from Dr [name] that she will see patient in March, no scans for that visit.  Reviewed chart, has not gotten back to radiation oncology as planned in Jan.  Note need to follow up 8 mm RUL lung area seen on PET 08-2016. Will need CT chest alone or with CT AP.   Scheduling message sent now for Dr [name] in March, and for appointment with lab to Dr [name] April.  Grace Wojtaszek MD

## 2017-01-14 ENCOUNTER — Telehealth: Payer: Self-pay | Admitting: Genetic Counselor

## 2017-01-14 NOTE — Telephone Encounter (Signed)
Pt ld to reschedule appt. Appt has been rescheduled to 4/9 at 2pm for gentc counseling. She agreed to the appt date and time.

## 2017-01-17 ENCOUNTER — Encounter: Payer: Self-pay | Admitting: Genetic Counselor

## 2017-01-17 ENCOUNTER — Other Ambulatory Visit: Payer: Self-pay

## 2017-02-02 ENCOUNTER — Ambulatory Visit: Payer: Self-pay | Admitting: Gynecologic Oncology

## 2017-02-04 ENCOUNTER — Telehealth: Payer: Self-pay | Admitting: *Deleted

## 2017-02-04 NOTE — Telephone Encounter (Signed)
I have attempted to contact the patient to reschedule her missed appt from earlier this week. I received no answer and was unable to leave a message.

## 2017-02-09 ENCOUNTER — Telehealth: Payer: Self-pay | Admitting: *Deleted

## 2017-02-09 NOTE — Telephone Encounter (Signed)
Second attempt to contact the patient regarding her missed appt. No answer and unable to leave a message

## 2017-02-25 DIAGNOSIS — Z79899 Other long term (current) drug therapy: Secondary | ICD-10-CM | POA: Diagnosis not present

## 2017-02-25 DIAGNOSIS — M47817 Spondylosis without myelopathy or radiculopathy, lumbosacral region: Secondary | ICD-10-CM | POA: Diagnosis not present

## 2017-02-25 DIAGNOSIS — G894 Chronic pain syndrome: Secondary | ICD-10-CM | POA: Diagnosis not present

## 2017-02-25 DIAGNOSIS — Z79891 Long term (current) use of opiate analgesic: Secondary | ICD-10-CM | POA: Diagnosis not present

## 2017-02-25 DIAGNOSIS — M961 Postlaminectomy syndrome, not elsewhere classified: Secondary | ICD-10-CM | POA: Diagnosis not present

## 2017-02-25 DIAGNOSIS — M545 Low back pain: Secondary | ICD-10-CM | POA: Diagnosis not present

## 2017-02-28 ENCOUNTER — Other Ambulatory Visit: Payer: Medicare Other

## 2017-02-28 ENCOUNTER — Encounter: Payer: Self-pay | Admitting: Genetic Counselor

## 2017-02-28 ENCOUNTER — Telehealth: Payer: Self-pay | Admitting: Hematology and Oncology

## 2017-02-28 ENCOUNTER — Ambulatory Visit (HOSPITAL_BASED_OUTPATIENT_CLINIC_OR_DEPARTMENT_OTHER): Payer: Medicare Other | Admitting: Genetic Counselor

## 2017-02-28 DIAGNOSIS — C539 Malignant neoplasm of cervix uteri, unspecified: Secondary | ICD-10-CM

## 2017-02-28 DIAGNOSIS — Z808 Family history of malignant neoplasm of other organs or systems: Secondary | ICD-10-CM | POA: Diagnosis not present

## 2017-02-28 DIAGNOSIS — Z806 Family history of leukemia: Secondary | ICD-10-CM | POA: Diagnosis not present

## 2017-02-28 DIAGNOSIS — C530 Malignant neoplasm of endocervix: Secondary | ICD-10-CM

## 2017-02-28 DIAGNOSIS — Z315 Encounter for genetic counseling: Secondary | ICD-10-CM

## 2017-02-28 NOTE — Progress Notes (Signed)
REFERRING PROVIDER: Deland Pretty, MD [address] [place], [place] [address]  PRIMARY PROVIDER:  Horatio Pel, MD  PRIMARY REASON FOR VISIT:  1. Cervical cancer, FIGO stage IB2 ([place])   2. Family history of bone cancer   3. Family history of leukemia      HISTORY OF PRESENT ILLNESS:   Ms. Eastland, a 66 y.o. female, was seen for a [facility] cancer genetics consultation at the request of Dr. Marko Plume due to a personal and family history of cancer.  Ms. Sidener presents to clinic today to discuss the possibility of a hereditary predisposition to cancer, genetic testing, and to further clarify her future cancer risks, as well as potential cancer risks for family members.   In 2017, at the age of 35, Ms. Pennino was diagnosed with cancer of the endocervix. This was treated with hysterectomy, chemotherapy and radiation.     CANCER HISTORY:   No history exists.     HORMONAL RISK FACTORS:  Menarche was at age 30.  First live birth at age 57.  OCP use for approximately 2 years.  Ovaries intact: no.  Hysterectomy: yes.  Menopausal status: postmenopausal.  HRT use: 0 years. Colonoscopy: no; not examined. Mammogram within the last year: yes. Number of breast biopsies: 1. Up to date with pelvic exams:  yes. Any excessive radiation exposure in the past:  Just for cancer treatment  Past Medical History:  Diagnosis Date  . Arthritis    HANDS  . Borderline hypertension    ON  MEDS WENT OFF 2016 DUE TO DID NOT LIKE MEDS  . Cancer ([place]) 2017   ENDO METRIAL CANCER  . Chronic low back pain   . Degenerative spinal arthritis   . Depression   . Endometrial polyp   . Family history of adverse reaction to anesthesia    SISTER WITH SEVERE PONV  . Family history of bone cancer   . Family history of leukemia   . Numbness in left leg    DUE TO BACK INJURY 2007  . Osteoporosis   . PMB (postmenopausal bleeding)   . Pneumonia 2015  . Wears glasses     Past Surgical  History:  Procedure Laterality Date  . HYSTEROSCOPY W/D&C N/A 07/06/2016   Procedure: DILATATION AND CURETTAGE /HYSTEROSCOPY;  Surgeon: Molli Posey, MD;  Location: [facility];  Service: Gynecology;  Laterality: N/A;  . INCISION AND DRAINAGE BREAST ABSCESS Left 1977  . IR GENERIC HISTORICAL  10/05/2016   IR FLUORO GUIDE CV LINE RIGHT 10/05/2016 Marybelle Killings, MD [facility]  . IR GENERIC HISTORICAL  10/05/2016   IR US GUIDE VASC ACCESS RIGHT 10/05/2016 Marybelle Killings, MD [facility]  . LUMBAR FUSION  2007   L1 -- L3  . MULTIPLE EXTRACTIONS WITH ALVEOLOPLASTY N/A 09/08/2016   Procedure: Extraction of tooth #'s 4,5,13,14,15,17,18,19,29 and 31 with alveoloplasty and gross debridement of remaining teeth;  Surgeon: Lenn Cal, DDS;  Location: [facility];  Service: Oral Surgery;  Laterality: N/A;  . ROBOTIC ASSISTED TOTAL HYSTERECTOMY WITH BILATERAL SALPINGO OOPHERECTOMY Bilateral 08/05/2016   Procedure: XI ROBOTIC ASSISTED TOTAL HYSTERECTOMY WITH BILATERAL SALPINGO OOPHORECTOMY SENTINAL LYMPH NODE BIOPSY;  Surgeon: Everitt Amber, MD;  Location: [facility];  Service: Gynecology;  Laterality: Bilateral;    Social History   Social History  . Marital status: Divorced    Spouse name: N/A  . Number of children: 3  . Years of education: N/A   Social History Main Topics  . Smoking status:  Never Smoker  . Smokeless tobacco: Never Used  . Alcohol use No  . Drug use: No  . Sexual activity: Not on file   Other Topics Concern  . Not on file   Social History Narrative  . No narrative on file     FAMILY HISTORY:  We obtained a detailed, 4-generation family history.  Significant diagnoses are listed below: Family History  Problem Relation Age of Onset  . Cancer Mother 67    leukemia  . Heart disease Father 74  . Leukemia Sister     hairy cell leukemia  . Cancer Son 25    osteosarcoma  . Heart attack Brother     The patient has three children.  Her youngest child was  diagnosed with an osteosarcoma of his hip at age 44 and died at 40.  She has three sisters and two brothers.  One sister was diagnosed with hairy cell leukemia.  The patient's mother was diagnosed with an aggressive leukemia and died shortly after her diagnosis.  She was an only child.  The patient's grandmother was diagnosed with colon cancer in her early 65's, and her sister had an unknown cancer.  The patient's father was an only child to his parents, although he had a paternal half brother and sister. The patient's is not aware that they have had cancer.  Ms. Mcmannis is unaware of previous family history of genetic testing for hereditary cancer risks. Patient's maternal ancestors are of [place] and [place] descent, and paternal ancestors are of [place] and [place] descent. There is no reported Ashkenazi Jewish ancestry. There is no known consanguinity.  GENETIC COUNSELING ASSESSMENT: CHARMIAN FORBIS is a 66 y.o. female with a personal history of endocervical cancer and family history of osteosarcoma, colon cancer and leukemia. We, therefore, discussed and recommended the following at today's visit.   DISCUSSION: We reviewed her family history and observed that there seems to be more cancer than expected in her immediate family.  We discussed that about 5-10% of cancer, in general, is hereditary with most cancer due to sporadic reasons.  We discussed that the cancer in the family can sometimes be seen in Maylon Peppers syndrome, but that, outside of her son, none of the cancers in the family occur at a young age.  We further discussed that her grandmother was relatively young when she developed colon cancer (early 8's) and that her mother reportedly had multiple polyps each time she went for colonoscopy, and her sister who has leukemia has colon polyps as well.  We reviewed the characteristics, features and inheritance patterns of hereditary cancer syndromes. We discussed with Ms. Ayub that the family history  is not highly consistent with a familial hereditary cancer syndrome, and we feel she is at low risk to harbor a gene mutation associated with such a condition. Thus, we did not recommend any genetic testing, at this time, and recommended Ms. Moncrief continue to follow the cancer screening guidelines given by her primary healthcare provider.  PLAN: We discussed that the next step for Ms. Garringer should be to consider a colonoscopy. Depending on how many colon polyps she has, we could reconsider what we are telling her, and she may be eligible based on the number of polyps she has, to undergo genetic testing.  We encouraged Ms. Merkel to remain in contact with cancer genetics annually so that we can continuously update the family history and inform her of any changes in cancer genetics and testing that may be  of benefit for this family.   Ms.  Feigenbaum questions were answered to her satisfaction today. Our contact information was provided should additional questions or concerns arise. Thank you for the referral and allowing [place] to share in the care of your patient.   Geraldene Eisel P. Florene Glen, [place], [facility] Certified Genetic Counselor [email or web address] phone: [phone]  The patient was seen for a total of 45 minutes in face-to-face genetic counseling.  This patient was discussed with Drs. Magrinat, Lindi Adie and/or Burr Medico who agrees with the above.    _______________________________________________________________________ For Office Staff:  Number of people involved in session: 1 Was an Intern/ student involved with case: no

## 2017-02-28 NOTE — Telephone Encounter (Signed)
Patient stopped today after genetics to schedule appointment. Former patient of [facility] already schedule with NG. Patient given copy of 4/19 schedule.

## 2017-03-02 ENCOUNTER — Telehealth: Payer: Self-pay | Admitting: *Deleted

## 2017-03-02 NOTE — Telephone Encounter (Signed)
I have made two attempted to contact the patient. Per staff message from Dr. Jacklynn Lewis, patient needs a follow up with Dr. Denman George. There is no answer and voicemail not set up

## 2017-03-03 ENCOUNTER — Telehealth: Payer: Self-pay | Admitting: *Deleted

## 2017-03-03 NOTE — Telephone Encounter (Signed)
Per staff message from Dr. Alvy Bimler I have attempted several times to contact the patient to schedule appt with Dr.Rossi.There is no answer and patients voicemail is not set up.

## 2017-03-03 NOTE — Telephone Encounter (Signed)
I have sent Dr.Gorsuch a message that I attempted to contact the patient. Dr. Alvy Bimler and I will try to set up appt with Dr.Rossi when she comes in on 4/19

## 2017-03-10 ENCOUNTER — Ambulatory Visit (HOSPITAL_BASED_OUTPATIENT_CLINIC_OR_DEPARTMENT_OTHER): Payer: Medicare Other | Admitting: Hematology and Oncology

## 2017-03-10 ENCOUNTER — Other Ambulatory Visit (HOSPITAL_BASED_OUTPATIENT_CLINIC_OR_DEPARTMENT_OTHER): Payer: Medicare Other

## 2017-03-10 ENCOUNTER — Telehealth: Payer: Self-pay | Admitting: *Deleted

## 2017-03-10 ENCOUNTER — Other Ambulatory Visit: Payer: Self-pay | Admitting: Hematology and Oncology

## 2017-03-10 DIAGNOSIS — D696 Thrombocytopenia, unspecified: Secondary | ICD-10-CM

## 2017-03-10 DIAGNOSIS — D6481 Anemia due to antineoplastic chemotherapy: Secondary | ICD-10-CM

## 2017-03-10 DIAGNOSIS — C530 Malignant neoplasm of endocervix: Secondary | ICD-10-CM

## 2017-03-10 DIAGNOSIS — D72819 Decreased white blood cell count, unspecified: Secondary | ICD-10-CM | POA: Diagnosis not present

## 2017-03-10 DIAGNOSIS — C539 Malignant neoplasm of cervix uteri, unspecified: Secondary | ICD-10-CM

## 2017-03-10 DIAGNOSIS — G620 Drug-induced polyneuropathy: Secondary | ICD-10-CM

## 2017-03-10 DIAGNOSIS — G8929 Other chronic pain: Secondary | ICD-10-CM | POA: Diagnosis not present

## 2017-03-10 DIAGNOSIS — T451X5A Adverse effect of antineoplastic and immunosuppressive drugs, initial encounter: Principal | ICD-10-CM

## 2017-03-10 DIAGNOSIS — R911 Solitary pulmonary nodule: Secondary | ICD-10-CM | POA: Diagnosis not present

## 2017-03-10 DIAGNOSIS — R32 Unspecified urinary incontinence: Secondary | ICD-10-CM

## 2017-03-10 DIAGNOSIS — G894 Chronic pain syndrome: Secondary | ICD-10-CM

## 2017-03-10 LAB — CBC & DIFF AND RETIC
BASO%: 0.6 % (ref 0.0–2.0)
Basophils Absolute: 0 10*3/uL (ref 0.0–0.1)
EOS ABS: 0.1 10*3/uL (ref 0.0–0.5)
EOS%: 2.2 % (ref 0.0–7.0)
HCT: 34.7 % — ABNORMAL LOW (ref 34.8–46.6)
HGB: 12.2 g/dL (ref 11.6–15.9)
Immature Retic Fract: 3.7 % (ref 1.60–10.00)
LYMPH#: 1.2 10*3/uL (ref 0.9–3.3)
LYMPH%: 33.1 % (ref 14.0–49.7)
MCH: 34.1 pg — AB (ref 25.1–34.0)
MCHC: 35.2 g/dL (ref 31.5–36.0)
MCV: 96.9 fL (ref 79.5–101.0)
MONO#: 0.4 10*3/uL (ref 0.1–0.9)
MONO%: 11.7 % (ref 0.0–14.0)
NEUT%: 52.4 % (ref 38.4–76.8)
NEUTROS ABS: 1.9 10*3/uL (ref 1.5–6.5)
Platelets: 136 10*3/uL — ABNORMAL LOW (ref 145–400)
RBC: 3.58 10*6/uL — ABNORMAL LOW (ref 3.70–5.45)
RDW: 12.3 % (ref 11.2–14.5)
RETIC %: 1.27 % (ref 0.70–2.10)
RETIC CT ABS: 45.47 10*3/uL (ref 33.70–90.70)
WBC: 3.6 10*3/uL — AB (ref 3.9–10.3)

## 2017-03-10 MED ORDER — GABAPENTIN 300 MG PO CAPS
ORAL_CAPSULE | ORAL | 2 refills | Status: DC
Start: 2017-03-10 — End: 2017-06-22

## 2017-03-10 MED FILL — GABAPENTIN 300 MG CAPSULE: 300 | 30 days supply | Qty: 150 | Fill #0 | Status: TO

## 2017-03-10 NOTE — Telephone Encounter (Signed)
I have scheduled the patient to see Dr. Denman George per Dr. Alvy Bimler. Patient aware od date/time

## 2017-03-11 ENCOUNTER — Encounter: Payer: Self-pay | Admitting: Hematology and Oncology

## 2017-03-11 ENCOUNTER — Telehealth: Payer: Self-pay | Admitting: Hematology and Oncology

## 2017-03-11 DIAGNOSIS — R32 Unspecified urinary incontinence: Secondary | ICD-10-CM | POA: Insufficient documentation

## 2017-03-11 DIAGNOSIS — T451X5A Adverse effect of antineoplastic and immunosuppressive drugs, initial encounter: Secondary | ICD-10-CM

## 2017-03-11 DIAGNOSIS — G620 Drug-induced polyneuropathy: Secondary | ICD-10-CM | POA: Insufficient documentation

## 2017-03-11 DIAGNOSIS — C539 Malignant neoplasm of cervix uteri, unspecified: Secondary | ICD-10-CM | POA: Insufficient documentation

## 2017-03-11 DIAGNOSIS — G894 Chronic pain syndrome: Secondary | ICD-10-CM | POA: Insufficient documentation

## 2017-03-11 NOTE — Assessment & Plan Note (Signed)
She has mild peripheral neuropathy We discussed mild dose modification of her gabapentin I also recommend vitamin B12 supplement

## 2017-03-11 NOTE — Assessment & Plan Note (Signed)
She has chronic pain disorder and is being managed by pain specialist I recommend consideration for high dose vitamin D supplement, especially in view of prior diagnosis of osteoporosis and history of bone fracture

## 2017-03-11 NOTE — Assessment & Plan Note (Addendum)
She is doing well with minimal residual side effects from treatment She has appointment to see GYN oncologist soon I recommend she discuss her issue with urinary incontinence with her surgeon I plan to see her back in 3 months to review again whether neuropathy is better controlled with changes in her medications

## 2017-03-11 NOTE — Telephone Encounter (Signed)
Called patient to inform her of next scheduled appointments. No VM set up to leave a message.  Mailed out schedule.

## 2017-03-11 NOTE — Assessment & Plan Note (Signed)
She described urinary incontinence since her diagnosis, unclear whether it was related to surgery or side effects from radiation I recommend she discuss this with her gynecologist in her next visit

## 2017-03-11 NOTE — Assessment & Plan Note (Signed)
Her anemia has resolved but she has very mild leukopenia and thrombocytopenia I will recheck blood count in her next visit

## 2017-03-11 NOTE — Progress Notes (Signed)
[place] FOLLOW-UP progress notes  Patient Care Team: Deland Pretty, MD as PCP - General (Internal Medicine)  CHIEF COMPLAINTS/PURPOSE OF VISIT:  Adenoccarcinoma of the cervix, status post hysterectomy, adjuvant chemotherapy and radiation therapy  HISTORY OF PRESENTING ILLNESS:  [name] 66 y.o. female was transferred to my care after her prior physician has left.  I reviewed the patient's records extensive and collaborated the history with the patient. Summary of her history is as follows:   Cervical cancer ([place])   06/01/2016 Initial Diagnosis    Patient had intermittent vaginal spotting beginning ~ 04-2016. She had sonohystogram by Dr Matthew Saras 06-01-16 with 2.4 cm endometrial polyp identified.      07/06/2016 Surgery    Procedure: Hysteroscopy, D&C      07/06/2016 Pathology Results    Endometrium, curettage - ENDOMETRIOID ADENOCARCINOMA, SEE COMMENT. Microscopic Comment The carcinoma appears FIGO grade 2.       08/05/2016 Surgery    Operation: Robotic-assisted laparoscopic total hysterectomy with bilateral salpingoophorectomy, sentinel lymph node biopsy   Operative Findings:  : 6cm normal appearing uterus, tubes and ovaries. No suspicious nodes.      08/05/2016 Pathology Results    1. Lymph node, sentinel, biopsy, right external iliac - ISOLATED TUMOR CELLS. - SEE MICROSCOPIC DESCRIPTION. 2. Lymph node, sentinel, biopsy, left external iliac - MICROMETASTATIC CARCINOMA IN ONE LYMPH NODE (1/1). 3. Uterus, ovaries and fallopian tubes, cervix - INVASIVE ADENOCARCINOMA INVOLVING ENDOCERVIX AND ENDOCERVICAL CANAL. - SEE ONCOLOGY TABLE AND COMMENT. Microscopic Comment 3. UTERINE CERVIX Specimen: Uterus with bilateral fallopian tubes and ovaries and right and left external iliac sentinel lymph nodes. Procedure: Hysterectomy with bilateral salpingo-oophorectomy and sentinel lymph node biopsies. Tumor site (quadrant if known): Endocervix and endocervical  canal Maximum tumor size (cm): 4 cm. Histologic type: Adenocarcinoma, see comment. Grade: Intermediate. Margins: Free of tumor. Distance of carcinoma from closest margin: 0.2 cm from parametrial soft tissue margin. Depth of invasion: 10 mm in depth where cervix is 15 mm in thickness. Horizontal extent (mm): 40 mm (4 cm) Lymph-Vascular invasion: Present. Vaginal extension: N/A Uterine corpus extension: Extends into high endocervical canal and focally lower uterine segment. Parametrial involvement: No. Lymph nodes: number examined - 2 ; number positive- 2 TNM code: pT1b2, pN1, pMX FIGO Stage (based on pathologic findings, need clinical correlation): IIIB Comment: The tumor is 4 cm in greatest dimension and involves the endocervix and endocervical canal and invades the cervical stroma to a depth of 1 cm where the cervix is measured as 1.5 cm in thickness. The tumor has villoglandular and endometrioid features. Immunohistochemistry shows diffuse strong positivity with p16 and patchy positivity with carcinoembryonic antigen while the tumor is negative with p53, estrogen receptor, progesterone receptor, and vimentin. The immunophenotype is more typical of an endocervical adenocarcinoma. In addition the entire endomyometrium is examined and no involvement by carcinoma of the endometrium is identified. There are a few microscopic foci with features suggesting precursor adenocarcinoma in situ. Immunohistochemistry for cytokeratin AE1/AE3 is performed and the right external iliac node shows positivity consistent with isolated tumor cells and the left external iliac node shows positive consistent with microscopic foci of metastatic carcinoma. No involvement of the endometrium, uterine serosa, bilateral ovaries and bilateral fallopian tubes is identified.      08/31/2016 PET scan    1. No hypermetabolic metastatic disease identified. 2. 8 mm right upper lobe ground-glass opacity. Consider dedicated chest CT  to serve as a baseline and allow surveillance. This could represent an area of  post infectious/inflammatory scarring or an indolent neoplasm such as low-grade adenocarcinoma. 3.  Possible constipation. 4. Hypermetabolism in the left mandible, likely correlated with a mandibular periapical and lucency.      09/08/2016 Surgery    She had multiple extraction of tooth numbers 4, 5,13, 14, 15, 17,18,19, 29, and 31, 4 Quadrants of alveoloplasty and Gross debridement of remaining dentition      09/23/2016 - 11/10/2016 Radiation Therapy    Radiation treatment dates:   09/23/16 - 10/28/16  ;  11/01/16 - 11/10/16  Site/dose:   The Pelvis was treated to 45 Gy in 25 fractions of 1.8 Gy. The patient then underwent vaginal brachytherapy, and the Vaginal Cuff was treated to 18 Gy in 3 fractions of 6 Gy.  Beams/energy:   Pelvis : IMRT  //  6X                              VagCuff : HDR-Vaginal  //  Iridium HDR       09/27/2016 - 10/18/2016 Chemotherapy    She had weekly cisplatin       10/05/2016 Procedure    Successful placement of a right arm PICC with sonographic and fluoroscopic guidance. The catheter is ready for use.      She is doing well after treatment has been completed She denies hearing problems with cisplatin Denies recent infection She complain of urinary incontinence since her diagnosis and she wears a pad on a regular basis She denies symptoms of cystitis She has mild peripheral neuropathy and was prescribed gabapentin It helps very little She takes pain medicine on a chronic basis and follows with the pain specialist due to chronic back pain and degenerative spinal arthritis She denies recent vaginal discomfort or bleeding  MEDICAL HISTORY:  Past Medical History:  Diagnosis Date  . Arthritis    HANDS  . Borderline hypertension    ON  MEDS WENT OFF 2016 DUE TO DID NOT LIKE MEDS  . Cancer ([place]) 2017   ENDO METRIAL CANCER  . Chronic low back pain   . Degenerative spinal  arthritis   . Depression   . Endometrial polyp   . Family history of adverse reaction to anesthesia    SISTER WITH SEVERE PONV  . Family history of bone cancer   . Family history of leukemia   . Numbness in left leg    DUE TO BACK INJURY 2007  . Osteoporosis   . PMB (postmenopausal bleeding)   . Pneumonia 2015  . Wears glasses     SURGICAL HISTORY: Past Surgical History:  Procedure Laterality Date  . HYSTEROSCOPY W/D&C N/A 07/06/2016   Procedure: DILATATION AND CURETTAGE /HYSTEROSCOPY;  Surgeon: Molli Posey, MD;  Location[facility];  Service: Gynecology;  Laterality: N/A;  . INCISION AND DRAINAGE BREAST ABSCESS Left 1977  . IR GENERIC HISTORICAL  10/05/2016   IR FLUORO GUIDE CV LINE RIGHT 10/05/2016 Marybelle Killings, MD [facility]  . IR GENERIC HISTORICAL  10/05/2016   IR US GUIDE VASC ACCESS RIGHT 10/05/2016 Marybelle Killings, MD [facility]  . LUMBAR FUSION  2007   L1 -- L3  . MULTIPLE EXTRACTIONS WITH ALVEOLOPLASTY N/A 09/08/2016   Procedure: Extraction of tooth #'s 4,5,13,14,15,17,18,19,29 and 31 with alveoloplasty and gross debridement of remaining teeth;  Surgeon: Lenn Cal, DDS;  Location: [facility];  Service: Oral Surgery;  Laterality: N/A;  . ROBOTIC ASSISTED TOTAL HYSTERECTOMY WITH BILATERAL SALPINGO  OOPHERECTOMY Bilateral 08/05/2016   Procedure: XI ROBOTIC ASSISTED TOTAL HYSTERECTOMY WITH BILATERAL SALPINGO OOPHORECTOMY SENTINAL LYMPH NODE BIOPSY;  Surgeon: Everitt Amber, MD;  Location: [facility];  Service: Gynecology;  Laterality: Bilateral;    SOCIAL HISTORY: Social History   Social History  . Marital status: Divorced    Spouse name: N/A  . Number of children: 3  . Years of education: N/A   Occupational History  . Not on file.   Social History Main Topics  . Smoking status: Never Smoker  . Smokeless tobacco: Never Used  . Alcohol use No  . Drug use: No  . Sexual activity: Not on file   Other Topics Concern  . Not on file   Social  History Narrative  . No narrative on file    FAMILY HISTORY: Family History  Problem Relation Age of Onset  . Cancer Mother 35    leukemia  . Heart disease Father 52  . Leukemia Sister     hairy cell leukemia  . Cancer Son 25    osteosarcoma  . Heart attack Brother     ALLERGIES:  has No Known Allergies.  MEDICATIONS:  Current Outpatient Prescriptions  Medication Sig Dispense Refill  . docusate sodium (COLACE) 100 MG capsule Take 100 mg by mouth 2 (two) times daily.    Marland Kitchen gabapentin (NEURONTIN) 300 MG capsule Take 2 caps in the morning and 3 caps in the evening 150 capsule 2  . oxyCODONE-acetaminophen (PERCOCET) 7.5-325 MG tablet Take 2 tablets by mouth every 8 (eight) hours as needed for severe pain.     . polyethylene glycol (MIRALAX / GLYCOLAX) packet Take 17 g by mouth daily as needed for moderate constipation.    Marland Kitchen loperamide (IMODIUM A-D) 2 MG tablet Take 2-4 mg by mouth 4 (four) times daily as needed for diarrhea or loose stools.    Marland Kitchen LORazepam (ATIVAN) 0.5 MG tablet Place 1 tablet under the tongue or swallow every 6 hrs as needed for nausea. Will make drowsy. (Patient not taking: Reported on 11/29/2016) 20 tablet 0  . ondansetron (ZOFRAN) 8 MG tablet Take 1 tablet (8 mg total) by mouth every 8 (eight) hours as needed for nausea (Will not make drowsy.). (Patient not taking: Reported on 11/29/2016) 30 tablet 0   No current facility-administered medications for this visit.     REVIEW OF SYSTEMS:   Constitutional: Denies fevers, chills or abnormal night sweats Eyes: Denies blurriness of vision, double vision or watery eyes Ears, nose, mouth, throat, and face: Denies mucositis or sore throat Respiratory: Denies cough, dyspnea or wheezes Cardiovascular: Denies palpitation, chest discomfort or lower extremity swelling Gastrointestinal:  Denies nausea, heartburn or change in bowel habits Skin: Denies abnormal skin rashes Lymphatics: Denies new lymphadenopathy or easy  bruising Behavioral/Psych: Mood is stable, no new changes  All other systems were reviewed with the patient and are negative.  PHYSICAL EXAMINATION: ECOG PERFORMANCE STATUS: 1 - Symptomatic but completely ambulatory  Vitals:   03/10/17 1237  BP: 116/78  Pulse: 79  Resp: 18  Temp: 98.5 F (36.9 C)   Filed Weights   03/10/17 1237  Weight: 144 lb 8 oz (65.5 kg)    GENERAL:alert, no distress and comfortable SKIN: skin color, texture, turgor are normal, no rashes or significant lesions EYES: normal, conjunctiva are pink and non-injected, sclera clear OROPHARYNX:no exudate, normal lips, buccal mucosa, and tongue  NECK: supple, thyroid normal size, non-tender, without nodularity LYMPH:  no palpable lymphadenopathy in the cervical, axillary or inguinal  LUNGS: clear to auscultation and percussion with normal breathing effort HEART: regular rate & rhythm and no murmurs without lower extremity edema ABDOMEN:abdomen soft, non-tender and normal bowel sounds Musculoskeletal:no cyanosis of digits and no clubbing  PSYCH: alert & oriented x 3 with fluent speech NEURO: no focal motor/sensory deficits  LABORATORY DATA:  I have reviewed the data as listed Lab Results  Component Value Date   WBC 3.6 (L) 03/10/2017   HGB 12.2 03/10/2017   HCT 34.7 (L) 03/10/2017   MCV 96.9 03/10/2017   PLT 136 (L) 03/10/2017    Recent Labs  08/03/16 1450 08/06/16 0447  10/29/16 1200 11/01/16 1004 11/29/16 1119  NA 138 136  < > 139 141 144  K 4.2 4.5  < > 4.5 4.2 4.0  CL 103 103  --   --   --   --   CO2 26 25  < > _0 GLUCOSE 103* 133*  < > 104 110 104  BUN 10 7  < > 14.1 14.3 13.6  CREATININE 0.82 0.72  < > 1.0 0.9 0.9  CALCIUM 9.4 9.0  < > 8.9 8.9 9.4  GFRNONAA >60 >60  --   --   --   --   GFRAA >60 >60  --   --   --   --   PROT 7.5  --   < > 5.8* 6.0* 6.4  ALBUMIN 4.6  --   < > 3.2* 3.3* 3.8  AST 23  --   < > _1 ALT 15  --   < > _2 ALKPHOS 84  --   < > 117 120 102   BILITOT 0.7  --   < > 0.42 0.31 0.33  < > = values in this interval not displayed.  ASSESSMENT & PLAN:  Cervical cancer ([place]) She is doing well with minimal residual side effects from treatment She has appointment to see GYN oncologist soon I recommend she discuss her issue with urinary incontinence with her surgeon I plan to see her back in 3 months to review again whether neuropathy is better controlled with changes in her medications  Solitary lung nodule This was an incidental finding noted on prior PET CT scan I am awaiting her follow-up appointment with GYN oncologist to see whether repeat imaging study is indicated from her standpoint If not, I will order CT scan of the chest to follow  Anemia due to antineoplastic chemotherapy Her anemia has resolved but she has very mild leukopenia and thrombocytopenia I will recheck blood count in her next visit  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 We discussed mild dose modification of her gabapentin I also recommend vitamin B12 supplement  Chronic pain disorder She has chronic pain disorder and is being managed by pain specialist I recommend consideration for high dose vitamin D supplement, especially in view of prior diagnosis of osteoporosis and history of bone fracture  Urinary incontinence in female She described urinary incontinence since her diagnosis, unclear whether it was related to surgery or side effects from radiation I recommend she discuss this with her gynecologist in her next visit   No orders of the defined types were placed in this encounter.   All questions were answered. The patient knows to call the clinic with any problems, questions or concerns. I spent 40 minutes counseling the patient face to face. The total time spent in the appointment was 41  minutes and more than 50% was on counseling.     Heath Lark, MD 03/11/2017 8:26 AM

## 2017-03-11 NOTE — Assessment & Plan Note (Signed)
This was an incidental finding noted on prior PET CT scan I am awaiting her follow-up appointment with GYN oncologist to see whether repeat imaging study is indicated from her standpoint If not, I will order CT scan of the chest to follow

## 2017-03-28 ENCOUNTER — Encounter: Payer: Self-pay | Admitting: Gynecologic Oncology

## 2017-03-28 ENCOUNTER — Ambulatory Visit: Payer: Medicare Other | Attending: Gynecologic Oncology | Admitting: Gynecologic Oncology

## 2017-03-28 VITALS — BP 127/91 | HR 87 | Temp 98.2°F | Resp 18 | Wt 144.7 lb

## 2017-03-28 DIAGNOSIS — R911 Solitary pulmonary nodule: Secondary | ICD-10-CM | POA: Diagnosis not present

## 2017-03-28 DIAGNOSIS — Z90710 Acquired absence of both cervix and uterus: Secondary | ICD-10-CM | POA: Diagnosis not present

## 2017-03-28 DIAGNOSIS — Z8541 Personal history of malignant neoplasm of cervix uteri: Secondary | ICD-10-CM | POA: Diagnosis not present

## 2017-03-28 DIAGNOSIS — Z79891 Long term (current) use of opiate analgesic: Secondary | ICD-10-CM | POA: Insufficient documentation

## 2017-03-28 DIAGNOSIS — C539 Malignant neoplasm of cervix uteri, unspecified: Secondary | ICD-10-CM

## 2017-03-28 DIAGNOSIS — Z800 Family history of malignant neoplasm of digestive organs: Secondary | ICD-10-CM | POA: Diagnosis not present

## 2017-03-28 DIAGNOSIS — C541 Malignant neoplasm of endometrium: Secondary | ICD-10-CM | POA: Insufficient documentation

## 2017-03-28 DIAGNOSIS — M810 Age-related osteoporosis without current pathological fracture: Secondary | ICD-10-CM | POA: Diagnosis not present

## 2017-03-28 DIAGNOSIS — C530 Malignant neoplasm of endocervix: Secondary | ICD-10-CM | POA: Diagnosis not present

## 2017-03-28 DIAGNOSIS — M545 Low back pain: Secondary | ICD-10-CM | POA: Diagnosis not present

## 2017-03-28 DIAGNOSIS — Z981 Arthrodesis status: Secondary | ICD-10-CM | POA: Diagnosis not present

## 2017-03-28 DIAGNOSIS — Z90722 Acquired absence of ovaries, bilateral: Secondary | ICD-10-CM | POA: Diagnosis not present

## 2017-03-28 DIAGNOSIS — Z9889 Other specified postprocedural states: Secondary | ICD-10-CM | POA: Insufficient documentation

## 2017-03-28 DIAGNOSIS — G8929 Other chronic pain: Secondary | ICD-10-CM | POA: Diagnosis not present

## 2017-03-28 DIAGNOSIS — N393 Stress incontinence (female) (male): Secondary | ICD-10-CM | POA: Diagnosis not present

## 2017-03-28 DIAGNOSIS — Z806 Family history of leukemia: Secondary | ICD-10-CM | POA: Diagnosis not present

## 2017-03-28 NOTE — Progress Notes (Signed)
Consult Note: Gyn-Onc  Consult was requested by Dr. Matthew Saras for the evaluation of [name] 66 y.o. female  CC:  Chief Complaint  Patient presents with  . Cervical Cancer    Assessment/Plan:  Ms. [name]  is a 66 y.o.  year old with stage IB2, lymph node positive endocervical adenocarcinoma.  s/p adjuvant chemotherapy and radiation (completed December, 2017.  NED on clinical exam.  CT chest to follow lung lesion.  Urinary incontinence - referral to Dr McDiarmid.  Follow-up for cervical cancer: Dr Alvy Bimler in July, 2018, Dr Sondra Come in September 2018, Dr Denman George in December, 2018.  HPI: [name] is a 66 year old G3P3 who is seen in consultation at the request of Dr Matthew Saras for grade 2 endometrial cancer.   The patient reports a history of postmenopausal bleeding since June 2017. She underwent a sonohysterogram in Dr. Delanna Ahmadi office on 06/01/2016 that demonstrated a normal sized uterus measuring 6.5 x 2.6 x 2.6 cm but with an endometrial polyp measuring 2.4 cm. She was taken to the operating room on 07/06/2016 for a minor sure resection of the polyp. Final pathology from this revealed a grade 2 endometrioid adenocarcinoma.  The patient is otherwise a fairly healthy woman. Her major medical history significant for a horseback riding accident from which she required repair of L1-2 and 3 through a lateral approach on the left. She has a flank incision from this. She has persistent left lower extremity weakness, and paresthesia secondary to this. She also has chronic opiate use and takes Percocet 7.5 mg 3 times a day and Neurontin 3 times a day due to this pain.  She is a striking family history for malignancy. Her son died at age 47 of sarcoma. Her sister has a history of leukemia, and her mother also died of leukemia. She is maternal grandmother with a history of colon cancer and a maternal great aunt with history of colon cancer.  On 9.14.17 she underwent a robotic  assisted total hysterectomy (extrafascial) with SLN biopsy for presumed grade 2 endometrial cancer. Final pathology revealed a moderately differentiated endocervical adenocarcinoma. It infiltrated 10 of 4mm (66%) of the cervical stroma, was positive for LVSI, measured 4cm, 78mm margin with the paracervical tissues and was associated with 2 positive SLN's (1 ITC, 1 micrometastasis).   Postop PET scan in October, 2018 showed no frank metastatic disease, though there was a ground glass lesion in the RUL that needed repeat imaging for follow-up.  Interval Hx: She went on to complete chemoradiation with cddp weekly and EBRT 45 Gy in 25 fractions followed by brachytherapy 18Gy in 3 fractions (completed 11/10/17).  She had no issues with therapy though now has stress urinary incontinence which is bothersome.  Current Meds:  Outpatient Encounter Prescriptions as of 03/28/2017  Medication Sig  . docusate sodium (COLACE) 100 MG capsule Take 100 mg by mouth 2 (two) times daily.  Marland Kitchen gabapentin (NEURONTIN) 300 MG capsule Take 2 caps in the morning and 3 caps in the evening  . oxyCODONE-acetaminophen (PERCOCET) 7.5-325 MG tablet Take 2 tablets by mouth every 8 (eight) hours as needed for severe pain.   . polyethylene glycol (MIRALAX / GLYCOLAX) packet Take 17 g by mouth daily as needed for moderate constipation.  Marland Kitchen loperamide (IMODIUM A-D) 2 MG tablet Take 2-4 mg by mouth 4 (four) times daily as needed for diarrhea or loose stools.  . [DISCONTINUED] LORazepam (ATIVAN) 0.5 MG tablet Place 1 tablet under the tongue or swallow every  6 hrs as needed for nausea. Will make drowsy. (Patient not taking: Reported on 11/29/2016)  . [DISCONTINUED] ondansetron (ZOFRAN) 8 MG tablet Take 1 tablet (8 mg total) by mouth every 8 (eight) hours as needed for nausea (Will not make drowsy.). (Patient not taking: Reported on 11/29/2016)   No facility-administered encounter medications on file as of 03/28/2017.     Allergy: No Known  Allergies  Social Hx:   Social History   Social History  . Marital status: Divorced    Spouse name: N/A  . Number of children: 3  . Years of education: N/A   Occupational History  . Not on file.   Social History Main Topics  . Smoking status: Never Smoker  . Smokeless tobacco: Never Used  . Alcohol use No  . Drug use: No  . Sexual activity: Not on file   Other Topics Concern  . Not on file   Social History Narrative  . No narrative on file    Past Surgical Hx:  Past Surgical History:  Procedure Laterality Date  . HYSTEROSCOPY W/D&C N/A 07/06/2016   Procedure: DILATATION AND CURETTAGE /HYSTEROSCOPY;  Surgeon: Molli Posey, MD;  Location: [facility];  Service: Gynecology;  Laterality: N/A;  . INCISION AND DRAINAGE BREAST ABSCESS Left 1977  . IR GENERIC HISTORICAL  10/05/2016   IR FLUORO GUIDE CV LINE RIGHT 10/05/2016 Marybelle Killings, MD [facility]  . IR GENERIC HISTORICAL  10/05/2016   IR US GUIDE VASC ACCESS RIGHT 10/05/2016 Marybelle Killings, MD [facility]  . LUMBAR FUSION  2007   L1 -- L3  . MULTIPLE EXTRACTIONS WITH ALVEOLOPLASTY N/A 09/08/2016   Procedure: Extraction of tooth #'s 4,5,13,14,15,17,18,19,29 and 31 with alveoloplasty and gross debridement of remaining teeth;  Surgeon: Lenn Cal, DDS;  Location: [facility];  Service: Oral Surgery;  Laterality: N/A;  . ROBOTIC ASSISTED TOTAL HYSTERECTOMY WITH BILATERAL SALPINGO OOPHERECTOMY Bilateral 08/05/2016   Procedure: XI ROBOTIC ASSISTED TOTAL HYSTERECTOMY WITH BILATERAL SALPINGO OOPHORECTOMY SENTINAL LYMPH NODE BIOPSY;  Surgeon: Everitt Amber, MD;  Location: [facility];  Service: Gynecology;  Laterality: Bilateral;    Past Medical Hx:  Past Medical History:  Diagnosis Date  . Arthritis    HANDS  . Borderline hypertension    ON  MEDS WENT OFF 2016 DUE TO DID NOT LIKE MEDS  . Cancer ([place]) 2017   ENDO METRIAL CANCER  . Chronic low back pain   . Degenerative spinal arthritis   . Depression   .  Endometrial polyp   . Family history of adverse reaction to anesthesia    SISTER WITH SEVERE PONV  . Family history of bone cancer   . Family history of leukemia   . Numbness in left leg    DUE TO BACK INJURY 2007  . Osteoporosis   . PMB (postmenopausal bleeding)   . Pneumonia 2015  . Wears glasses     Past Gynecological History:  G3P3 SVD'x No LMP recorded. Patient has had a hysterectomy.  Family Hx:  Family History  Problem Relation Age of Onset  . Cancer Mother 45    leukemia  . Heart disease Father 51  . Leukemia Sister     hairy cell leukemia  . Cancer Son 25    osteosarcoma  . Heart attack Brother     Review of Systems:  Constitutional  Feels well,    ENT Normal appearing ears and nares bilaterally Skin/Breast  No rash, sores, jaundice, itching, dryness Cardiovascular  No chest pain,  shortness of breath, or edema  Pulmonary  No cough or wheeze.  Gastro Intestinal  No nausea, vomitting, or diarrhoea. No bright red blood per rectum, no abdominal pain, change in bowel movement, or constipation.  Genito Urinary  No frequency, urgency, dysuria, no bleeding Musculo Skeletal  No myalgia, arthralgia, joint swelling or pain  Neurologic  No weakness, numbness, change in gait,  Psychology  No depression, anxiety, insomnia.   Vitals:  Blood pressure (!) 127/91, pulse 87, temperature 98.2 F (36.8 C), resp. rate 18, weight 144 lb 11.2 oz (65.6 kg).  Physical Exam: WD in NAD Neck  Supple NROM, without any enlargements.  Lymph Node Survey No cervical supraclavicular or inguinal adenopathy Cardiovascular  Pulse normal rate, regularity and rhythm. S1 and S2 normal.  Lungs  Clear to auscultation bilateraly, without wheezes/crackles/rhonchi. Good air movement.  Skin  No rash/lesions/breakdown  Psychiatry  Alert and oriented to person, place, and time  Abdomen  Normoactive bowel sounds, abdomen soft, non-tender and nonobese without evidence of hernia. Well  healed incisions. Back No CVA tenderness Genito Urinary  Vulva/vagina: Normal external female genitalia.  No lesions. No discharge or bleeding.  Bladder/urethra:  No lesions or masses, well supported bladder  Vagina: normal well healed vaginal cuff with no blood or lesions.  Rectal  deferred Extremities  No bilateral cyanosis, clubbing or edema.  [name], MD  03/28/2017, 2:38 PM

## 2017-03-29 ENCOUNTER — Telehealth: Payer: Self-pay | Admitting: *Deleted

## 2017-03-29 DIAGNOSIS — R200 Anesthesia of skin: Secondary | ICD-10-CM | POA: Diagnosis not present

## 2017-03-29 NOTE — Telephone Encounter (Signed)
I have faxed the referral over to [facility].

## 2017-03-30 ENCOUNTER — Telehealth: Payer: Self-pay | Admitting: *Deleted

## 2017-03-30 DIAGNOSIS — Z79899 Other long term (current) drug therapy: Secondary | ICD-10-CM | POA: Diagnosis not present

## 2017-03-30 DIAGNOSIS — M961 Postlaminectomy syndrome, not elsewhere classified: Secondary | ICD-10-CM | POA: Diagnosis not present

## 2017-03-30 DIAGNOSIS — G894 Chronic pain syndrome: Secondary | ICD-10-CM | POA: Diagnosis not present

## 2017-03-30 DIAGNOSIS — M47817 Spondylosis without myelopathy or radiculopathy, lumbosacral region: Secondary | ICD-10-CM | POA: Diagnosis not present

## 2017-03-30 DIAGNOSIS — Z79891 Long term (current) use of opiate analgesic: Secondary | ICD-10-CM | POA: Diagnosis not present

## 2017-03-30 DIAGNOSIS — M545 Low back pain: Secondary | ICD-10-CM | POA: Diagnosis not present

## 2017-03-30 NOTE — Telephone Encounter (Signed)
I have attempted to contact the patient regarding her CT scan. No able to leave a message on her machine, voicemail not set up.

## 2017-03-31 ENCOUNTER — Encounter (HOSPITAL_COMMUNITY): Payer: Self-pay

## 2017-03-31 ENCOUNTER — Ambulatory Visit (HOSPITAL_COMMUNITY)
Admission: RE | Admit: 2017-03-31 | Discharge: 2017-03-31 | Disposition: A | Discharge status: Home / Self Care | Payer: Medicare Other | Source: Ambulatory Visit | Attending: Gynecologic Oncology | Admitting: Gynecologic Oncology

## 2017-03-31 ENCOUNTER — Other Ambulatory Visit (HOSPITAL_COMMUNITY): Payer: Self-pay | Admitting: Radiology

## 2017-03-31 ENCOUNTER — Telehealth: Payer: Self-pay | Admitting: *Deleted

## 2017-03-31 DIAGNOSIS — C539 Malignant neoplasm of cervix uteri, unspecified: Secondary | ICD-10-CM | POA: Insufficient documentation

## 2017-03-31 DIAGNOSIS — R911 Solitary pulmonary nodule: Secondary | ICD-10-CM | POA: Diagnosis not present

## 2017-03-31 DIAGNOSIS — C760 Malignant neoplasm of head, face and neck: Secondary | ICD-10-CM | POA: Diagnosis not present

## 2017-03-31 LAB — POCT I-STAT CREATININE: Creatinine, Ser: 1 mg/dL (ref 0.44–1.00)

## 2017-03-31 MED ORDER — IOPAMIDOL (ISOVUE-300) INJECTION 61%
75.0000 mL | Freq: Once | INTRAVENOUS | Status: AC | PRN
  Administered 2017-03-31: 75 mL via INTRAVENOUS

## 2017-03-31 MED ORDER — IOPAMIDOL (ISOVUE-300) INJECTION 61%
INTRAVENOUS | Status: AC
  Filled 2017-03-31: qty 75

## 2017-04-01 ENCOUNTER — Telehealth: Payer: Self-pay

## 2017-04-01 NOTE — Telephone Encounter (Signed)
Open by mistake

## 2017-04-01 NOTE — Telephone Encounter (Signed)
Told [name] the results of the CT scan as noted below by Joylene John, NP.

## 2017-04-01 NOTE — Telephone Encounter (Signed)
-----   Message from Dorothyann Gibbs, NP sent at 04/01/2017 12:18 PM EDT ----- Please let her know the nodule seen on her last CT has resolved.  Good news!

## 2017-04-27 DIAGNOSIS — M47817 Spondylosis without myelopathy or radiculopathy, lumbosacral region: Secondary | ICD-10-CM | POA: Diagnosis not present

## 2017-04-27 DIAGNOSIS — M961 Postlaminectomy syndrome, not elsewhere classified: Secondary | ICD-10-CM | POA: Diagnosis not present

## 2017-04-27 DIAGNOSIS — M79606 Pain in leg, unspecified: Secondary | ICD-10-CM | POA: Diagnosis not present

## 2017-04-27 DIAGNOSIS — G894 Chronic pain syndrome: Secondary | ICD-10-CM | POA: Diagnosis not present

## 2017-05-02 ENCOUNTER — Other Ambulatory Visit: Payer: Self-pay | Admitting: Pain Medicine

## 2017-05-02 DIAGNOSIS — M545 Low back pain: Secondary | ICD-10-CM

## 2017-05-21 ENCOUNTER — Ambulatory Visit
Admission: RE | Admit: 2017-05-21 | Discharge: 2017-05-21 | Disposition: A | Discharge status: Home / Self Care | Payer: Medicare Other | Source: Ambulatory Visit | Attending: Pain Medicine | Admitting: Pain Medicine

## 2017-05-21 DIAGNOSIS — M545 Low back pain: Secondary | ICD-10-CM

## 2017-05-21 DIAGNOSIS — M5126 Other intervertebral disc displacement, lumbar region: Secondary | ICD-10-CM | POA: Diagnosis not present

## 2017-05-24 DIAGNOSIS — F4542 Pain disorder with related psychological factors: Secondary | ICD-10-CM | POA: Diagnosis not present

## 2017-05-24 DIAGNOSIS — M792 Neuralgia and neuritis, unspecified: Secondary | ICD-10-CM | POA: Diagnosis not present

## 2017-05-24 DIAGNOSIS — G609 Hereditary and idiopathic neuropathy, unspecified: Secondary | ICD-10-CM | POA: Diagnosis not present

## 2017-05-24 DIAGNOSIS — G894 Chronic pain syndrome: Secondary | ICD-10-CM | POA: Diagnosis not present

## 2017-05-27 DIAGNOSIS — G894 Chronic pain syndrome: Secondary | ICD-10-CM | POA: Diagnosis not present

## 2017-05-27 DIAGNOSIS — M47817 Spondylosis without myelopathy or radiculopathy, lumbosacral region: Secondary | ICD-10-CM | POA: Diagnosis not present

## 2017-05-27 DIAGNOSIS — M961 Postlaminectomy syndrome, not elsewhere classified: Secondary | ICD-10-CM | POA: Diagnosis not present

## 2017-05-27 DIAGNOSIS — M79606 Pain in leg, unspecified: Secondary | ICD-10-CM | POA: Diagnosis not present

## 2017-06-07 ENCOUNTER — Ambulatory Visit: Payer: Self-pay | Admitting: Hematology and Oncology

## 2017-06-21 ENCOUNTER — Ambulatory Visit
Admission: RE | Admit: 2017-06-21 | Discharge: 2017-06-21 | Disposition: A | Discharge status: Home / Self Care | Payer: Medicare Other | Source: Ambulatory Visit | Attending: Physician Assistant | Admitting: Physician Assistant

## 2017-06-21 ENCOUNTER — Other Ambulatory Visit: Payer: Self-pay | Admitting: Physician Assistant

## 2017-06-21 DIAGNOSIS — M25551 Pain in right hip: Secondary | ICD-10-CM

## 2017-06-21 DIAGNOSIS — Z79891 Long term (current) use of opiate analgesic: Secondary | ICD-10-CM | POA: Diagnosis not present

## 2017-06-21 DIAGNOSIS — Z79899 Other long term (current) drug therapy: Secondary | ICD-10-CM | POA: Diagnosis not present

## 2017-06-21 DIAGNOSIS — M47817 Spondylosis without myelopathy or radiculopathy, lumbosacral region: Secondary | ICD-10-CM | POA: Diagnosis not present

## 2017-06-21 DIAGNOSIS — G894 Chronic pain syndrome: Secondary | ICD-10-CM | POA: Diagnosis not present

## 2017-06-21 DIAGNOSIS — M25559 Pain in unspecified hip: Secondary | ICD-10-CM | POA: Diagnosis not present

## 2017-06-21 DIAGNOSIS — M961 Postlaminectomy syndrome, not elsewhere classified: Secondary | ICD-10-CM | POA: Diagnosis not present

## 2017-06-22 ENCOUNTER — Other Ambulatory Visit: Payer: Self-pay | Admitting: Hematology and Oncology

## 2017-06-22 ENCOUNTER — Telehealth: Payer: Self-pay | Admitting: Hematology and Oncology

## 2017-06-22 NOTE — Telephone Encounter (Signed)
lvm to inform pt of 8/16 lab/md appt at 245 per sch msg

## 2017-07-07 ENCOUNTER — Encounter: Payer: Self-pay | Admitting: Hematology and Oncology

## 2017-07-07 ENCOUNTER — Other Ambulatory Visit (HOSPITAL_BASED_OUTPATIENT_CLINIC_OR_DEPARTMENT_OTHER): Payer: Medicare Other

## 2017-07-07 ENCOUNTER — Other Ambulatory Visit: Payer: Self-pay | Admitting: Hematology and Oncology

## 2017-07-07 ENCOUNTER — Ambulatory Visit (HOSPITAL_BASED_OUTPATIENT_CLINIC_OR_DEPARTMENT_OTHER): Payer: Medicare Other | Admitting: Hematology and Oncology

## 2017-07-07 VITALS — BP 136/83 | HR 84 | Temp 98.8°F | Resp 18 | Ht 62.0 in | Wt 140.6 lb

## 2017-07-07 DIAGNOSIS — G620 Drug-induced polyneuropathy: Secondary | ICD-10-CM

## 2017-07-07 DIAGNOSIS — T451X5A Adverse effect of antineoplastic and immunosuppressive drugs, initial encounter: Principal | ICD-10-CM

## 2017-07-07 DIAGNOSIS — D539 Nutritional anemia, unspecified: Secondary | ICD-10-CM | POA: Insufficient documentation

## 2017-07-07 DIAGNOSIS — C539 Malignant neoplasm of cervix uteri, unspecified: Secondary | ICD-10-CM

## 2017-07-07 DIAGNOSIS — G894 Chronic pain syndrome: Secondary | ICD-10-CM

## 2017-07-07 LAB — CBC WITH DIFFERENTIAL/PLATELET
BASO%: 0.2 % (ref 0.0–2.0)
Basophils Absolute: 0 10*3/uL (ref 0.0–0.1)
EOS%: 1.7 % (ref 0.0–7.0)
Eosinophils Absolute: 0.1 10*3/uL (ref 0.0–0.5)
HEMATOCRIT: 37.7 % (ref 34.8–46.6)
HGB: 12.9 g/dL (ref 11.6–15.9)
LYMPH#: 1.6 10*3/uL (ref 0.9–3.3)
LYMPH%: 30.8 % (ref 14.0–49.7)
MCH: 34 pg (ref 25.1–34.0)
MCHC: 34.2 g/dL (ref 31.5–36.0)
MCV: 99.5 fL (ref 79.5–101.0)
MONO#: 0.4 10*3/uL (ref 0.1–0.9)
MONO%: 8.5 % (ref 0.0–14.0)
NEUT#: 3.1 10*3/uL (ref 1.5–6.5)
NEUT%: 58.8 % (ref 38.4–76.8)
PLATELETS: 146 10*3/uL (ref 145–400)
RBC: 3.79 10*6/uL (ref 3.70–5.45)
RDW: 12 % (ref 11.2–14.5)
WBC: 5.2 10*3/uL (ref 3.9–10.3)

## 2017-07-07 MED ORDER — GABAPENTIN 300 MG PO CAPS: 900.0000 mg | ORAL_CAPSULE | Freq: Two times a day (BID) | ORAL | 0 refills | Status: DC

## 2017-07-07 NOTE — Assessment & Plan Note (Signed)
She has chronic pain disorder and is being managed by pain specialist I recommend consideration for high dose vitamin D supplement, especially in view of prior diagnosis of osteoporosis and history of bone fracture MRI showed degenerative joint disease 

## 2017-07-07 NOTE — Progress Notes (Signed)
[name] OFFICE PROGRESS NOTE  Patient Care Team: Deland Pretty, MD as PCP - General (Internal Medicine)  SUMMARY OF ONCOLOGIC HISTORY:   Cervical cancer ([place])   06/01/2016 Initial Diagnosis    Patient had intermittent vaginal spotting beginning ~ 04-2016. She had sonohystogram by Dr Matthew Saras 06-01-16 with 2.4 cm endometrial polyp identified.      07/06/2016 Surgery    Procedure: Hysteroscopy, D&C      07/06/2016 Pathology Results    Endometrium, curettage - ENDOMETRIOID ADENOCARCINOMA, SEE COMMENT. Microscopic Comment The carcinoma appears FIGO grade 2.       08/05/2016 Surgery    Operation: Robotic-assisted laparoscopic total hysterectomy with bilateral salpingoophorectomy, sentinel lymph node biopsy   Operative Findings:  : 6cm normal appearing uterus, tubes and ovaries. No suspicious nodes.      08/05/2016 Pathology Results    1. Lymph node, sentinel, biopsy, right external iliac - ISOLATED TUMOR CELLS. - SEE MICROSCOPIC DESCRIPTION. 2. Lymph node, sentinel, biopsy, left external iliac - MICROMETASTATIC CARCINOMA IN ONE LYMPH NODE (1/1). 3. Uterus, ovaries and fallopian tubes, cervix - INVASIVE ADENOCARCINOMA INVOLVING ENDOCERVIX AND ENDOCERVICAL CANAL. - SEE ONCOLOGY TABLE AND COMMENT. Microscopic Comment 3. UTERINE CERVIX Specimen: Uterus with bilateral fallopian tubes and ovaries and right and left external iliac sentinel lymph nodes. Procedure: Hysterectomy with bilateral salpingo-oophorectomy and sentinel lymph node biopsies. Tumor site (quadrant if known): Endocervix and endocervical canal Maximum tumor size (cm): 4 cm. Histologic type: Adenocarcinoma, see comment. Grade: Intermediate. Margins: Free of tumor. Distance of carcinoma from closest margin: 0.2 cm from parametrial soft tissue margin. Depth of invasion: 10 mm in depth where cervix is 15 mm in thickness. Horizontal extent (mm): 40 mm (4 cm) Lymph-Vascular invasion: Present. Vaginal  extension: N/A Uterine corpus extension: Extends into high endocervical canal and focally lower uterine segment. Parametrial involvement: No. Lymph nodes: number examined - 2 ; number positive- 2 TNM code: pT1b2, pN1, pMX FIGO Stage (based on pathologic findings, need clinical correlation): IIIB Comment: The tumor is 4 cm in greatest dimension and involves the endocervix and endocervical canal and invades the cervical stroma to a depth of 1 cm where the cervix is measured as 1.5 cm in thickness. The tumor has villoglandular and endometrioid features. Immunohistochemistry shows diffuse strong positivity with p16 and patchy positivity with carcinoembryonic antigen while the tumor is negative with p53, estrogen receptor, progesterone receptor, and vimentin. The immunophenotype is more typical of an endocervical adenocarcinoma. In addition the entire endomyometrium is examined and no involvement by carcinoma of the endometrium is identified. There are a few microscopic foci with features suggesting precursor adenocarcinoma in situ. Immunohistochemistry for cytokeratin AE1/AE3 is performed and the right external iliac node shows positivity consistent with isolated tumor cells and the left external iliac node shows positive consistent with microscopic foci of metastatic carcinoma. No involvement of the endometrium, uterine serosa, bilateral ovaries and bilateral fallopian tubes is identified.      08/31/2016 PET scan    1. No hypermetabolic metastatic disease identified. 2. 8 mm right upper lobe ground-glass opacity. Consider dedicated chest CT to serve as a baseline and allow surveillance. This could represent an area of post infectious/inflammatory scarring or an indolent neoplasm such as low-grade adenocarcinoma. 3.  Possible constipation. 4. Hypermetabolism in the left mandible, likely correlated with a mandibular periapical and lucency.      09/08/2016 Surgery    She had multiple extraction of tooth  numbers 4, 5,13, 14, 15, 17,18,19, 29, and 31, 4 Quadrants of  alveoloplasty and Gross debridement of remaining dentition      09/23/2016 - 11/10/2016 Radiation Therapy    Radiation treatment dates:   09/23/16 - 10/28/16  ;  11/01/16 - 11/10/16  Site/dose:   The Pelvis was treated to 45 Gy in 25 fractions of 1.8 Gy. The patient then underwent vaginal brachytherapy, and the Vaginal Cuff was treated to 18 Gy in 3 fractions of 6 Gy.  Beams/energy:   Pelvis : IMRT  //  6X                              VagCuff : HDR-Vaginal  //  Iridium HDR       09/27/2016 - 10/18/2016 Chemotherapy    She had weekly cisplatin       10/05/2016 Procedure    Successful placement of a right arm PICC with sonographic and fluoroscopic guidance. The catheter is ready for use.      03/31/2017 Imaging    Ct chest 1. Interval resolution of the 8 mm right upper lobe ground-glass nodule, consistent with infectious/inflammatory etiology on prior study. 2. Otherwise unremarkable CT exam of the chest.       INTERVAL HISTORY: Please see below for problem oriented charting. She returns for further follow-up She felt better with higher dose gabapentin She denies sedation or constipation Peripheral neuropathy is stable and not worse She continues to have chronic back pain She denies recent vaginal bleeding or pelvic pain No recent infection She is delighted to know that recent CT chest is negative  REVIEW OF SYSTEMS:   Constitutional: Denies fevers, chills or abnormal weight loss Eyes: Denies blurriness of vision Ears, nose, mouth, throat, and face: Denies mucositis or sore throat Respiratory: Denies cough, dyspnea or wheezes Cardiovascular: Denies palpitation, chest discomfort or lower extremity swelling Gastrointestinal:  Denies nausea, heartburn or change in bowel habits Skin: Denies abnormal skin rashes Lymphatics: Denies new lymphadenopathy or easy bruising Neurological:Denies numbness, tingling or new  weaknesses Behavioral/Psych: Mood is stable, no new changes  All other systems were reviewed with the patient and are negative.  I have reviewed the past medical history, past surgical history, social history and family history with the patient and they are unchanged from previous note.  ALLERGIES:  has No Known Allergies.  MEDICATIONS:  Current Outpatient Prescriptions  Medication Sig Dispense Refill  . docusate sodium (COLACE) 100 MG capsule Take 100 mg by mouth 2 (two) times daily.    Marland Kitchen gabapentin (NEURONTIN) 300 MG capsule Take 3 capsules (900 mg total) by mouth 2 (two) times daily. 180 capsule 0  . loperamide (IMODIUM A-D) 2 MG tablet Take 2-4 mg by mouth 4 (four) times daily as needed for diarrhea or loose stools.    Marland Kitchen oxyCODONE-acetaminophen (PERCOCET) 7.5-325 MG tablet Take 2 tablets by mouth every 8 (eight) hours as needed for severe pain.     . polyethylene glycol (MIRALAX / GLYCOLAX) packet Take 17 g by mouth daily as needed for moderate constipation.     No current facility-administered medications for this visit.     PHYSICAL EXAMINATION: ECOG PERFORMANCE STATUS: 1 - Symptomatic but completely ambulatory  Vitals:   07/07/17 1444  BP: 136/83  Pulse: 84  Resp: 18  Temp: 98.8 F (37.1 C)  SpO2: 96%   Filed Weights   07/07/17 1444  Weight: 140 lb 9.6 oz (63.8 kg)    GENERAL:alert, no distress and comfortable SKIN: skin color, texture, turgor are  normal, no rashes or significant lesions EYES: normal, Conjunctiva are pink and non-injected, sclera clear OROPHARYNX:no exudate, no erythema and lips, buccal mucosa, and tongue normal  NECK: supple, thyroid normal size, non-tender, without nodularity LYMPH:  no palpable lymphadenopathy in the cervical, axillary or inguinal LUNGS: clear to auscultation and percussion with normal breathing effort HEART: regular rate & rhythm and no murmurs and no lower extremity edema ABDOMEN:abdomen soft, non-tender and normal bowel  sounds Musculoskeletal:no cyanosis of digits and no clubbing  NEURO: alert & oriented x 3 with fluent speech, no focal motor/sensory deficits  LABORATORY DATA:  I have reviewed the data as listed    Component Value Date/Time   NA 144 11/29/2016 1119   K 4.0 11/29/2016 1119   CL 103 08/06/2016 0447   CO2 27 11/29/2016 1119   GLUCOSE 104 11/29/2016 1119   BUN 13.6 11/29/2016 1119   CREATININE 1.00 03/31/2017 1554   CREATININE 0.9 11/29/2016 1119   CALCIUM 9.4 11/29/2016 1119   PROT 6.4 11/29/2016 1119   ALBUMIN 3.8 11/29/2016 1119   AST 26 11/29/2016 1119   ALT 20 11/29/2016 1119   ALKPHOS 102 11/29/2016 1119   BILITOT 0.33 11/29/2016 1119   GFRNONAA >60 08/06/2016 0447   GFRAA >60 08/06/2016 0447    No results found for: SPEP, UPEP  Lab Results  Component Value Date   WBC 5.2 07/07/2017   NEUTROABS 3.1 07/07/2017   HGB 12.9 07/07/2017   HCT 37.7 07/07/2017   MCV 99.5 07/07/2017   PLT 146 07/07/2017      Chemistry      Component Value Date/Time   NA 144 11/29/2016 1119   K 4.0 11/29/2016 1119   CL 103 08/06/2016 0447   CO2 27 11/29/2016 1119   BUN 13.6 11/29/2016 1119   CREATININE 1.00 03/31/2017 1554   CREATININE 0.9 11/29/2016 1119      Component Value Date/Time   CALCIUM 9.4 11/29/2016 1119   ALKPHOS 102 11/29/2016 1119   AST 26 11/29/2016 1119   ALT 20 11/29/2016 1119   BILITOT 0.33 11/29/2016 1119       RADIOGRAPHIC STUDIES: I have personally reviewed the radiological images as listed and agreed with the findings in the report. Dg Hip Unilat With Pelvis 2-3 Views Right  Result Date: 06/21/2017 CLINICAL DATA:  66 year old female with chronic right hip pain. Acute pain started 2 days ago. No trauma. Initial encounter. EXAM: DG HIP (WITH OR WITHOUT PELVIS) 2-3V RIGHT COMPARISON:  None. FINDINGS: No right hip fracture or dislocation. No plain film evidence of right femoral head avascular necrosis. No significant joint space narrowing. IMPRESSION:  Negative. Electronically Signed   By: Genia Del M.D.   On: 06/21/2017 16:39    ASSESSMENT & PLAN:  Cervical cancer ([place]) Overall, she is doing well She had no signs or symptoms to suggest disease recurrence I plan to see her back next year purely for symptomatic management and I would defer to GYN oncologist for pelvic examination  Chronic pain disorder She has chronic pain disorder and is being managed by pain specialist I recommend consideration for high dose vitamin D supplement, especially in view of prior diagnosis of osteoporosis and history of bone fracture MRI showed degenerative joint disease  Peripheral neuropathy due to chemotherapy ([place]) Neuropathy symptoms are improving on higher dose gabapentin I recommend increasing gabapentin to 900 mg twice a day and I will continue to assess for benefit next year I warned her about risk of constipation and sedation She  is quite debilitated since chemotherapy I will refer her to cancer rehab center for physical therapy and she agreed   Orders Placed This Encounter  Procedures  . Ambulatory Referral to Physical Therapy    Referral Priority:   Routine    Referral Type:   Physical Medicine    Referral Reason:   Specialty Services Required    Requested Specialty:   Physical Therapy    Number of Visits Requested:   1   All questions were answered. The patient knows to call the clinic with any problems, questions or concerns. No barriers to learning was detected. I spent 15 minutes counseling the patient face to face. The total time spent in the appointment was 20 minutes and more than 50% was on counseling and review of test results     Heath Lark, MD 07/07/2017 4:20 PM

## 2017-07-07 NOTE — Assessment & Plan Note (Addendum)
Neuropathy symptoms are improving on higher dose gabapentin I recommend increasing gabapentin to 900 mg twice a day and I will continue to assess for benefit next year I warned her about risk of constipation and sedation She is quite debilitated since chemotherapy I will refer her to cancer rehab center for physical therapy and she agreed

## 2017-07-07 NOTE — Assessment & Plan Note (Signed)
Overall, she is doing well She had no signs or symptoms to suggest disease recurrence I plan to see her back next year purely for symptomatic management and I would defer to GYN oncologist for pelvic examination

## 2017-07-19 ENCOUNTER — Other Ambulatory Visit: Payer: Self-pay | Admitting: Hematology and Oncology

## 2017-07-19 DIAGNOSIS — Z79899 Other long term (current) drug therapy: Secondary | ICD-10-CM | POA: Diagnosis not present

## 2017-07-19 DIAGNOSIS — G894 Chronic pain syndrome: Secondary | ICD-10-CM | POA: Diagnosis not present

## 2017-07-19 DIAGNOSIS — M47817 Spondylosis without myelopathy or radiculopathy, lumbosacral region: Secondary | ICD-10-CM | POA: Diagnosis not present

## 2017-07-19 DIAGNOSIS — M961 Postlaminectomy syndrome, not elsewhere classified: Secondary | ICD-10-CM | POA: Diagnosis not present

## 2017-07-19 DIAGNOSIS — Z79891 Long term (current) use of opiate analgesic: Secondary | ICD-10-CM | POA: Diagnosis not present

## 2017-07-19 DIAGNOSIS — M25559 Pain in unspecified hip: Secondary | ICD-10-CM | POA: Diagnosis not present

## 2017-08-02 DIAGNOSIS — Z79891 Long term (current) use of opiate analgesic: Secondary | ICD-10-CM | POA: Diagnosis not present

## 2017-08-02 DIAGNOSIS — G894 Chronic pain syndrome: Secondary | ICD-10-CM | POA: Diagnosis not present

## 2017-08-02 DIAGNOSIS — Z79899 Other long term (current) drug therapy: Secondary | ICD-10-CM | POA: Diagnosis not present

## 2017-08-04 ENCOUNTER — Encounter: Payer: Self-pay | Admitting: Radiation Oncology

## 2017-08-04 ENCOUNTER — Ambulatory Visit
Admission: RE | Admit: 2017-08-04 | Discharge: 2017-08-04 | Disposition: A | Discharge status: Home / Self Care | Payer: Medicare Other | Source: Ambulatory Visit | Attending: Radiation Oncology | Admitting: Radiation Oncology

## 2017-08-04 VITALS — BP 133/85 | HR 79 | Temp 98.2°F | Ht 62.0 in | Wt 145.0 lb

## 2017-08-04 DIAGNOSIS — Z8541 Personal history of malignant neoplasm of cervix uteri: Secondary | ICD-10-CM | POA: Diagnosis not present

## 2017-08-04 DIAGNOSIS — Z08 Encounter for follow-up examination after completed treatment for malignant neoplasm: Secondary | ICD-10-CM | POA: Insufficient documentation

## 2017-08-04 DIAGNOSIS — Z9221 Personal history of antineoplastic chemotherapy: Secondary | ICD-10-CM | POA: Insufficient documentation

## 2017-08-04 DIAGNOSIS — Z923 Personal history of irradiation: Secondary | ICD-10-CM | POA: Diagnosis not present

## 2017-08-04 DIAGNOSIS — N3941 Urge incontinence: Secondary | ICD-10-CM | POA: Diagnosis not present

## 2017-08-04 DIAGNOSIS — C530 Malignant neoplasm of endocervix: Secondary | ICD-10-CM

## 2017-08-04 NOTE — Progress Notes (Signed)
  Home Care Instructions for the Insertion and Care of Your Vaginal Dilator  Why Do I Need a Vaginal Dilator?  Internal radiation therapy may cause scar tissue to form at the top of your vagina (vaginal cuff).  This may make vaginal examinations difficult in the future. You can prevent scar tissue from forming by using a vaginal dilator (a smooth plastic rod), and/or by having regular sexual intercourse.  If not using the dilator you should be having intercourse two or three times a week.  If you are unable to have intercourse, you should use your vaginal dilator.  You may have some spotting or bleeding from your dilator or intercourse the first few times. You may also have some discomfort. If discomfort occurs with intercourse, you and your partner may need to stop for a while and try again later.  How to Use Your Vaginal Dilator  - Wash the dilator with soap and water before and after each use. - Check the dilator to be sure it is smooth. Do not use the dilator if you find any roughspots. - Coat the dilator with K-Y Jelly, Astroglide, or Replens. Do not use Vaseline, baby oil, or other oil based lubricants. They are not water-soluble and can be irritating to the tissues in the vagina. - Lie on your back with your knees bent and legs apart. - Insert the rounded end of the dilator into your vagina as far as it will go without causing pain or discomfort. - Close your knees and slowly straighten your legs. - Keep the dilator in your vagina for about 10 to 15 minutes.  Please use it three times a week, for example, Monday, Wednesday and Friday evenings. [facility] your knees, open your legs, and gently remove the dilator. - Gently cleanse the skin around the vaginal opening. - Wash the dilator after each use. -  It is important that you use the dilator routinely until instructed otherwise by your doctor.

## 2017-08-04 NOTE — Progress Notes (Addendum)
[name] is here for follow up.  She reports having hip pain in her right hip that started 2 months ago.  She said she had an x ray which was normal.  She is now having pain in her left hip as well.  She does take percocet 4 tablets daily for back pain.  She reports having urinary urgency with occasional incontinence.  She denies having any bowel issues or vaginal bleeding/discharge.  She reports having fatigue.  She has been given size S+ and M vaginal dilators.  BP 133/85 (BP Location: Left Arm, Patient Position: Sitting)   Pulse 79   Temp 98.2 F (36.8 C) (Oral)   Ht 5\' 2"  (1.575 m)   Wt 145 lb (65.8 kg)   SpO2 99%   BMI 26.52 kg/m    Wt Readings from Last 3 Encounters:  08/04/17 145 lb (65.8 kg)  07/07/17 140 lb 9.6 oz (63.8 kg)  03/28/17 144 lb 11.2 oz (65.6 kg)

## 2017-08-04 NOTE — Progress Notes (Signed)
Radiation Oncology         [phone]) [phone] ________________________________  Name: [name] MRN: [id number]  Date: 08/04/2017  DOB: 05-Nov-1951  Follow-Up Visit Note  CC: Deland Pretty, MD  Everitt Amber, MD  No diagnosis found.  Diagnosis:   66 y.o. woman with Stage IIIB (pT1b2, pN1, pMX) endocervical adenocarcinoma.   Interval Since Last Radiation:  9 months  09/23/16 - 10/28/16  and  11/01/16 - 11/10/16  Site/dose: The Pelvis was treated to 45 Gy in 25 fractions of 1.8 Gy with radiosens. chemotherapy. The patient then underwent vaginal brachytherapy, and the Vaginal Cuff was treated to 18 Gy in 3 fractions of 6 Gy.  Narrative:  [name] is here for her follow up.  She reports having hip pain in her right hip that started 2 months ago.  She said she had an x ray which was normal.  She is now having pain in her left hip as well.  She does take percocet, 4 tablets daily for back pain.  She reports having urinary urgency with occasional incontinence.  She denies having any bowel issues or vaginal bleeding/discharge. No blood in urine or rectal bleeding.  She reports having fatigue. She is not using a vaginal dilator. She reports having some weakness and numbness in her legs. She reports using a cane for about a year and half, and having to use it around her house due to hip pain.  She follows up in the pain clinic concerning her hip pain.                          ALLERGIES:  has No Known Allergies.  Meds: Current Outpatient Prescriptions  Medication Sig Dispense Refill  . docusate sodium (COLACE) 100 MG capsule Take 100 mg by mouth 2 (two) times daily.    Marland Kitchen gabapentin (NEURONTIN) 300 MG capsule Take 3 capsules (900 mg total) by mouth 2 (two) times daily. 180 capsule 0  . oxyCODONE-acetaminophen (PERCOCET) 7.5-325 MG tablet Take 2 tablets by mouth every 8 (eight) hours as needed for severe pain.     . polyethylene glycol (MIRALAX / GLYCOLAX) packet Take 17 g by mouth daily as needed  for moderate constipation.    Marland Kitchen loperamide (IMODIUM A-D) 2 MG tablet Take 2-4 mg by mouth 4 (four) times daily as needed for diarrhea or loose stools.     No current facility-administered medications for this encounter.     Physical Findings: The patient is in no acute distress. Patient is alert and oriented.  height is 5\' 2"  (1.575 m) and weight is 145 lb (65.8 kg). Her oral temperature is 98.2 F (36.8 C). Her blood pressure is 133/85 and her pulse is 79. Her oxygen saturation is 99%. .  No significant changes.   Lungs are clear to auscultation bilaterally. Heart has regular rate and rhythm. No palpable cervical, supraclavicular, or axillary adenopathy. Abdomen soft, non-tender, normal bowel sounds.  Pelvic exam, external genital area are unremarkable, radiation changes in the proximal vagina, no mucosal lesions.  Bimanual examination and rectal examinations., no pelvic masses appreciated.   Lab Findings: Lab Results  Component Value Date   WBC 5.2 07/07/2017   HGB 12.9 07/07/2017   HCT 37.7 07/07/2017   MCV 99.5 07/07/2017   PLT 146 07/07/2017    Radiographic Findings: No results found.  Impression:  The patient is recovering from the effects of radiation, no evidence of reoccurence on the clincal exam. Patient  missed her one-month follow-up in radiation oncology and therefore did not receive her vaginal dilator. Today she was given a vaginal dilator and instructions on its use.  Plan:  Follow up in six months with radiation oncology. Patient will follow up with Dr. Denman George in December. ____________________________________ -----------------------------------  Blair Promise, PhD, MD  This document serves as a record of services personally performed by Gery Pray MD. It was created on his behalf by Delton Coombes, a trained medical scribe. The creation of this record is based on the scribe's personal observations and the provider's statements to them. This document has been  checked and approved by the attending provider.

## 2017-08-16 ENCOUNTER — Ambulatory Visit
Admission: RE | Admit: 2017-08-16 | Discharge: 2017-08-16 | Disposition: A | Discharge status: Home / Self Care | Payer: Medicare Other | Source: Ambulatory Visit | Attending: Physician Assistant | Admitting: Physician Assistant

## 2017-08-16 ENCOUNTER — Other Ambulatory Visit: Payer: Self-pay | Admitting: Physician Assistant

## 2017-08-16 DIAGNOSIS — M961 Postlaminectomy syndrome, not elsewhere classified: Secondary | ICD-10-CM | POA: Diagnosis not present

## 2017-08-16 DIAGNOSIS — R52 Pain, unspecified: Secondary | ICD-10-CM

## 2017-08-16 DIAGNOSIS — Z79899 Other long term (current) drug therapy: Secondary | ICD-10-CM | POA: Diagnosis not present

## 2017-08-16 DIAGNOSIS — M1612 Unilateral primary osteoarthritis, left hip: Secondary | ICD-10-CM | POA: Diagnosis not present

## 2017-08-16 DIAGNOSIS — M47817 Spondylosis without myelopathy or radiculopathy, lumbosacral region: Secondary | ICD-10-CM | POA: Diagnosis not present

## 2017-08-16 DIAGNOSIS — M25559 Pain in unspecified hip: Secondary | ICD-10-CM | POA: Diagnosis not present

## 2017-08-16 DIAGNOSIS — G894 Chronic pain syndrome: Secondary | ICD-10-CM | POA: Diagnosis not present

## 2017-08-16 DIAGNOSIS — Z79891 Long term (current) use of opiate analgesic: Secondary | ICD-10-CM | POA: Diagnosis not present

## 2017-08-18 ENCOUNTER — Other Ambulatory Visit: Payer: Self-pay | Admitting: Hematology and Oncology

## 2017-08-24 ENCOUNTER — Telehealth: Payer: Self-pay

## 2017-08-24 ENCOUNTER — Other Ambulatory Visit: Payer: Self-pay | Admitting: Hematology and Oncology

## 2017-08-24 DIAGNOSIS — G620 Drug-induced polyneuropathy: Secondary | ICD-10-CM

## 2017-08-24 DIAGNOSIS — T451X5A Adverse effect of antineoplastic and immunosuppressive drugs, initial encounter: Principal | ICD-10-CM

## 2017-08-24 MED ORDER — GABAPENTIN 300 MG PO CAPS: 900.0000 mg | ORAL_CAPSULE | Freq: Two times a day (BID) | ORAL | 1 refills | Status: DC

## 2017-08-24 NOTE — Telephone Encounter (Signed)
I refilled electronically

## 2017-08-24 NOTE — Telephone Encounter (Signed)
Patient called and left message regarding Gabapentin. She said she only got a 2 week supply instead of her 30 day supply.

## 2017-09-13 DIAGNOSIS — M47817 Spondylosis without myelopathy or radiculopathy, lumbosacral region: Secondary | ICD-10-CM | POA: Diagnosis not present

## 2017-09-13 DIAGNOSIS — M79606 Pain in leg, unspecified: Secondary | ICD-10-CM | POA: Diagnosis not present

## 2017-09-13 DIAGNOSIS — G894 Chronic pain syndrome: Secondary | ICD-10-CM | POA: Diagnosis not present

## 2017-09-13 DIAGNOSIS — M961 Postlaminectomy syndrome, not elsewhere classified: Secondary | ICD-10-CM | POA: Diagnosis not present

## 2017-09-16 IMAGING — MR MR LUMBAR SPINE W/O CM
4 of 5 series · 25 of 48 positions shown · non-contrast
Comparison: Radiographs 05/24/2011.  CT lumbar spine 07/03/2006.

CLINICAL DATA: Low back pain extending into the left leg with
weakness. History of back injury in 8118 with subsequent surgery. No
recent injury.

EXAM:
MRI LUMBAR SPINE WITHOUT CONTRAST
TECHNIQUE: Multiplanar, multisequence MR imaging of the lumbar spine was
performed. No intravenous contrast was administered.

[Series 3: T2 · sagittal · 4.0mm · 0.55mm/px · 6 of 16 slices shown (1 of 2)]
[im 1/16]
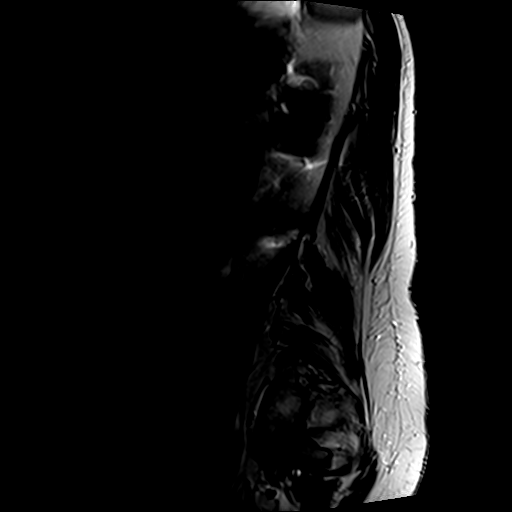
[im 4/16]
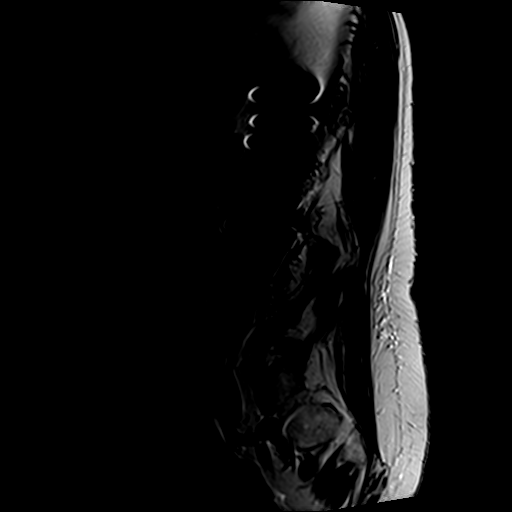
[im 7/16]
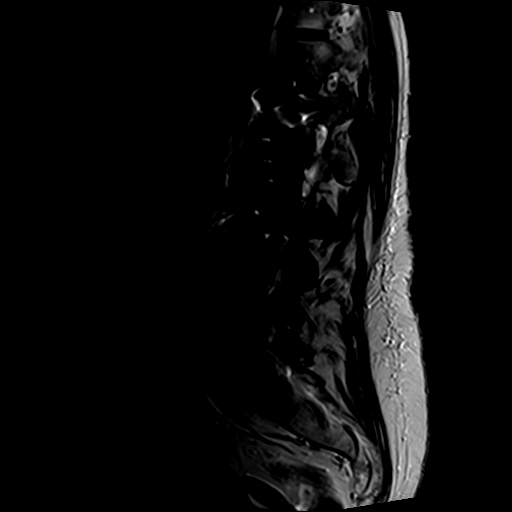
[im 10/16]
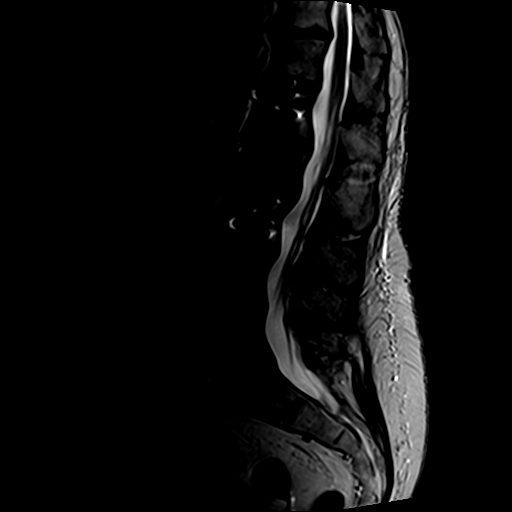
[im 13/16]
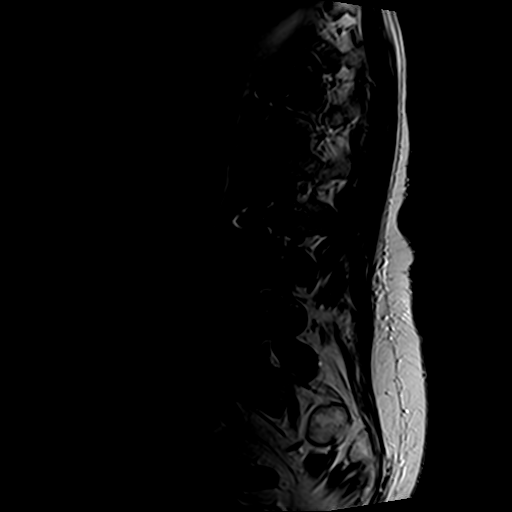
[im 16/16]
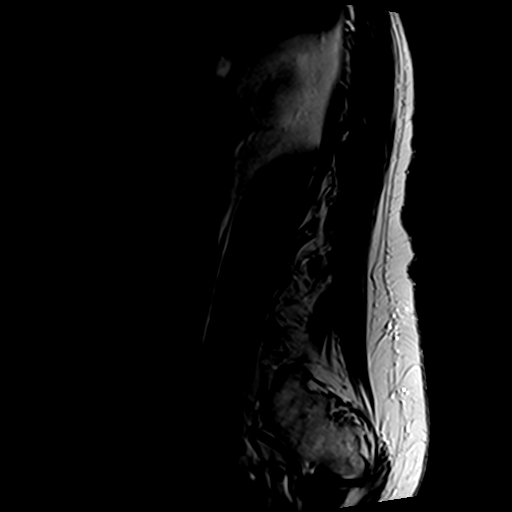

[Series 4: T1 · sagittal · 4.0mm · 0.55mm/px · 6 of 16 slices shown (1 of 2)]
[im 1/16]
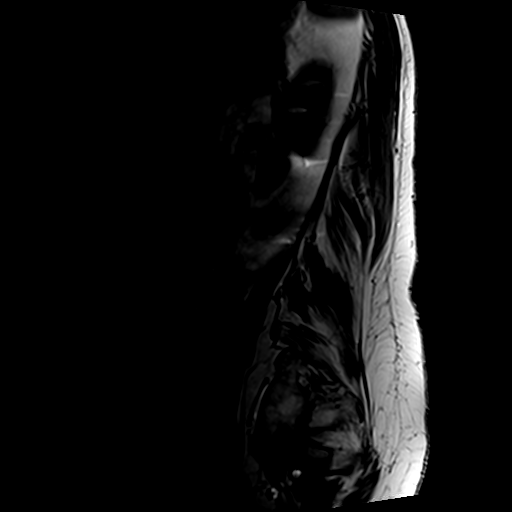
[im 4/16]
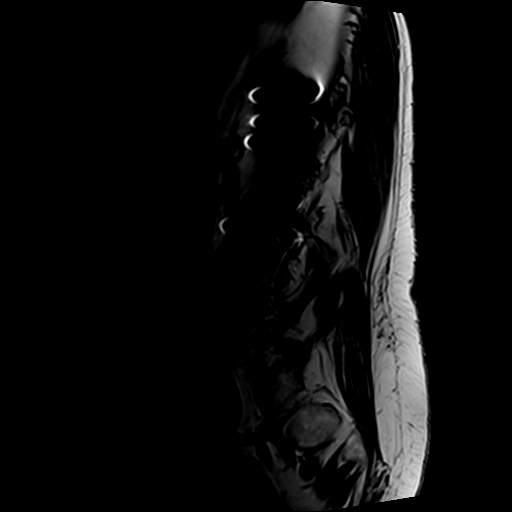
[im 7/16]
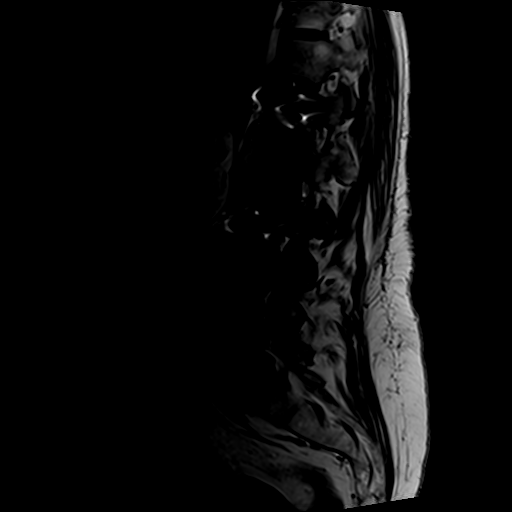
[im 10/16]
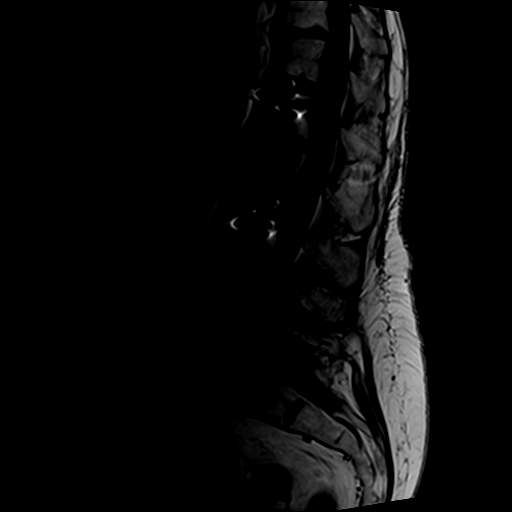
[im 13/16]
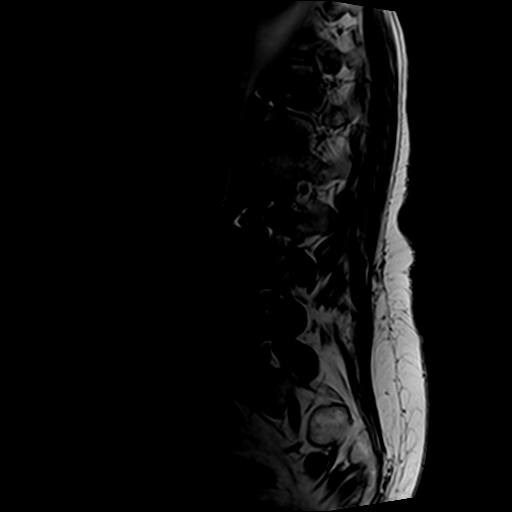
[im 16/16]
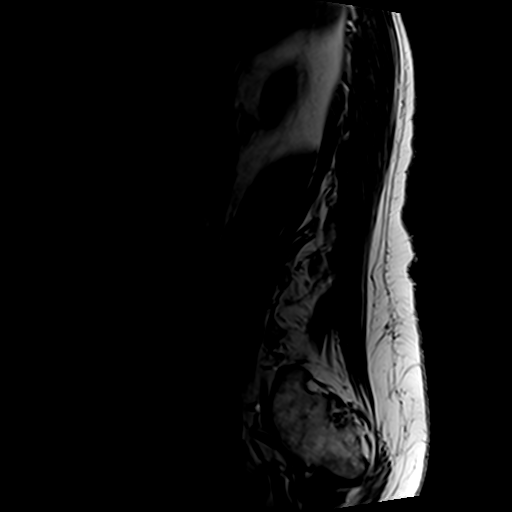

[Series 6: T2 · axial · 4.0mm · 0.70mm/px · z∈[+18,+231]mm · 9 of 40 slices shown (2 of 2)]
[im 1/40]
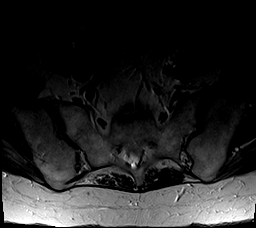
[im 6/40]
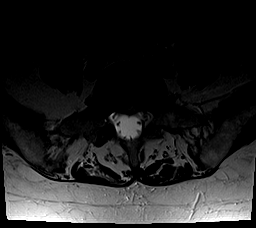
[im 12/40]
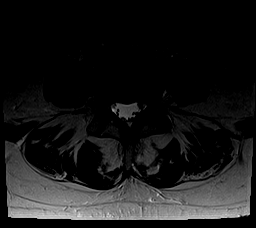
[im 17/40]
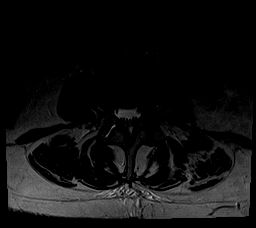
[im 20/40]
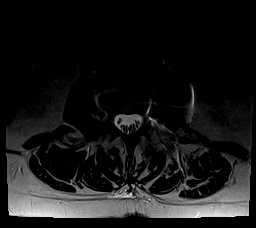
[im 23/40]
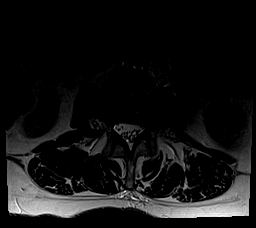
[im 28/40]
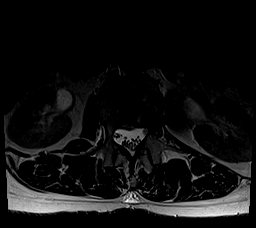
[im 34/40]
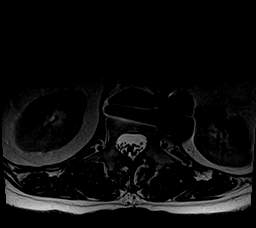
[im 40/40]
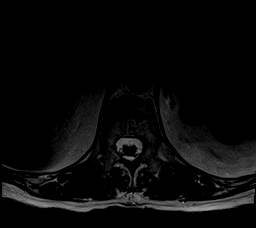

[Series 7: T1 · axial · 4.0mm · 0.35mm/px · z∈[+18,+202]mm · 4 of 40 slices shown (2 of 2)]
[im 1/40]
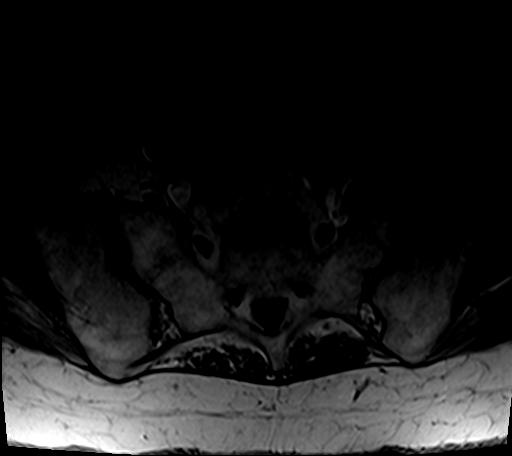
[im 6/40]
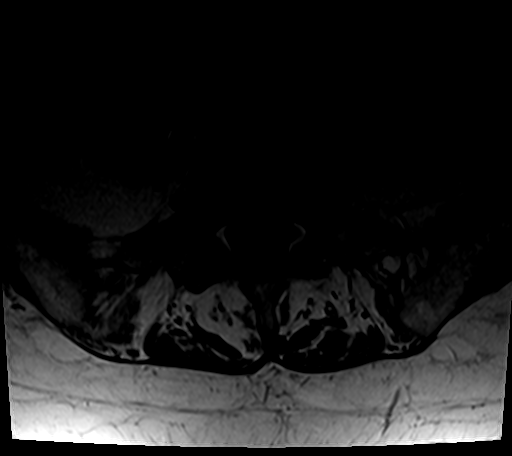
[im 20/40]
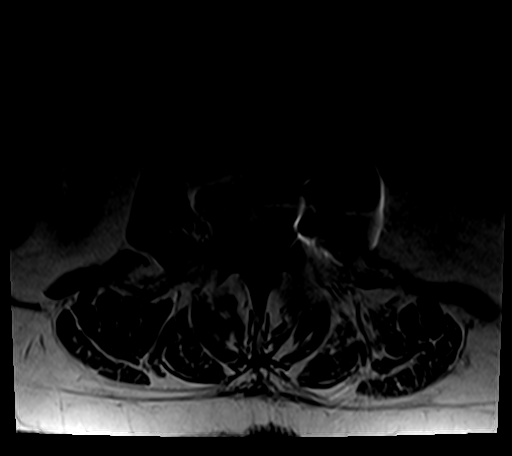
[im 34/40]
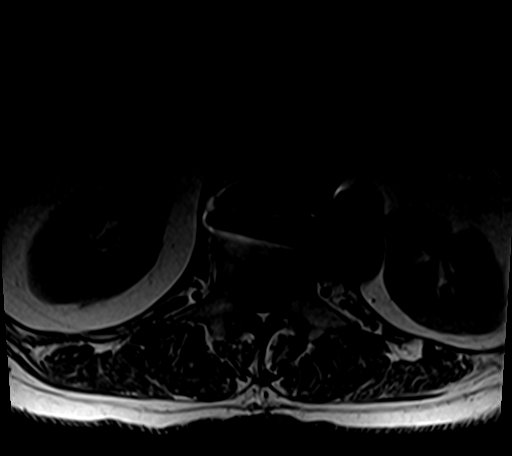

[25 of 48 positions shown; findings below may reference images not displayed]

FINDINGS: Segmentation: Conventional anatomy assumed, with the last open disc
space designated L5-S1.

Alignment:  Near anatomic.

Bones: Postsurgical changes status post partial corpectomy at L2,
strut graft and lateral plate fixation from L1 through L3. No acute
osseous findings. No evidence of pars defect.

Conus medullaris: Extends to the L1 level and appears normal.

Paraspinal and other soft tissues: There is atrophy of the left
psoas muscle. No other significant paraspinal findings.

Disc levels:

Sagittal images demonstrate disc degeneration with small posterior
osteophytes at T11-12. There is no resulting cord deformity or
foraminal compromise.

T12-L1:  Normal interspace.

L1-2: Postsurgical changes status post fusion. Minimal residual
osseous retropulsion at the L2 burst fracture. No spinal stenosis or
nerve root encroachment.

L2-3: Postsurgical changes status post fusion. No spinal stenosis or
nerve root encroachment.

L3-4: There is disc bulging with a shallow left foraminal disc
protrusion. There is resulting mild narrowing of the left lateral
recess and left foramen, although no definite L3 nerve root
encroachment. The left foramen is partly obscured by artifact from
the surgical hardware.

L4-5: Mild disc bulging and facet hypertrophy. No spinal stenosis or
nerve root encroachment.

L5-S1: Disc height and hydration are maintained. Mild bilateral
facet hypertrophy. No spinal stenosis or nerve root encroachment.
IMPRESSION: 1. Postsurgical changes status post L2 corpectomy and L1-3 fusion.
No residual spinal stenosis at the operative levels.
2. Adjacent segment disease at L3-4 with mild disc bulging and a
small left foraminal disc protrusion. No definite nerve root
encroachment identified. Left foraminal assessment is mildly limited
by artifact from the hardware.
3. Left psoas muscular atrophy.

## 2017-09-19 DIAGNOSIS — M1612 Unilateral primary osteoarthritis, left hip: Secondary | ICD-10-CM | POA: Diagnosis not present

## 2017-10-11 DIAGNOSIS — M47817 Spondylosis without myelopathy or radiculopathy, lumbosacral region: Secondary | ICD-10-CM | POA: Diagnosis not present

## 2017-10-11 DIAGNOSIS — M79606 Pain in leg, unspecified: Secondary | ICD-10-CM | POA: Diagnosis not present

## 2017-10-11 DIAGNOSIS — Z79899 Other long term (current) drug therapy: Secondary | ICD-10-CM | POA: Diagnosis not present

## 2017-10-11 DIAGNOSIS — M961 Postlaminectomy syndrome, not elsewhere classified: Secondary | ICD-10-CM | POA: Diagnosis not present

## 2017-10-11 DIAGNOSIS — G894 Chronic pain syndrome: Secondary | ICD-10-CM | POA: Diagnosis not present

## 2017-10-11 DIAGNOSIS — Z79891 Long term (current) use of opiate analgesic: Secondary | ICD-10-CM | POA: Diagnosis not present

## 2017-10-17 ENCOUNTER — Other Ambulatory Visit: Payer: Self-pay | Admitting: Pain Medicine

## 2017-10-17 DIAGNOSIS — M25552 Pain in left hip: Secondary | ICD-10-CM

## 2017-10-18 DIAGNOSIS — Z23 Encounter for immunization: Secondary | ICD-10-CM | POA: Diagnosis not present

## 2017-11-07 ENCOUNTER — Other Ambulatory Visit (HOSPITAL_COMMUNITY)
Admission: RE | Admit: 2017-11-07 | Discharge: 2017-11-07 | Disposition: A | Discharge status: Home / Self Care | Payer: Medicare Other | Source: Ambulatory Visit | Attending: Gynecologic Oncology | Admitting: Gynecologic Oncology

## 2017-11-07 ENCOUNTER — Other Ambulatory Visit: Payer: Self-pay | Admitting: Hematology and Oncology

## 2017-11-07 ENCOUNTER — Ambulatory Visit: Payer: Medicare Other | Attending: Gynecologic Oncology | Admitting: Gynecologic Oncology

## 2017-11-07 ENCOUNTER — Encounter: Payer: Self-pay | Admitting: Gynecologic Oncology

## 2017-11-07 VITALS — BP 142/89 | HR 112 | Temp 98.1°F | Resp 18 | Ht 62.0 in | Wt 138.5 lb

## 2017-11-07 DIAGNOSIS — Z79899 Other long term (current) drug therapy: Secondary | ICD-10-CM | POA: Diagnosis not present

## 2017-11-07 DIAGNOSIS — C539 Malignant neoplasm of cervix uteri, unspecified: Secondary | ICD-10-CM | POA: Diagnosis not present

## 2017-11-07 DIAGNOSIS — Z808 Family history of malignant neoplasm of other organs or systems: Secondary | ICD-10-CM | POA: Insufficient documentation

## 2017-11-07 DIAGNOSIS — Z9889 Other specified postprocedural states: Secondary | ICD-10-CM | POA: Diagnosis not present

## 2017-11-07 DIAGNOSIS — N393 Stress incontinence (female) (male): Secondary | ICD-10-CM

## 2017-11-07 DIAGNOSIS — C530 Malignant neoplasm of endocervix: Secondary | ICD-10-CM | POA: Diagnosis not present

## 2017-11-07 DIAGNOSIS — Z90722 Acquired absence of ovaries, bilateral: Secondary | ICD-10-CM | POA: Diagnosis not present

## 2017-11-07 DIAGNOSIS — R32 Unspecified urinary incontinence: Secondary | ICD-10-CM | POA: Diagnosis not present

## 2017-11-07 DIAGNOSIS — Z806 Family history of leukemia: Secondary | ICD-10-CM | POA: Insufficient documentation

## 2017-11-07 DIAGNOSIS — Z9221 Personal history of antineoplastic chemotherapy: Secondary | ICD-10-CM

## 2017-11-07 DIAGNOSIS — Z8249 Family history of ischemic heart disease and other diseases of the circulatory system: Secondary | ICD-10-CM | POA: Diagnosis not present

## 2017-11-07 DIAGNOSIS — Z981 Arthrodesis status: Secondary | ICD-10-CM | POA: Diagnosis not present

## 2017-11-07 DIAGNOSIS — Z923 Personal history of irradiation: Secondary | ICD-10-CM | POA: Diagnosis not present

## 2017-11-07 DIAGNOSIS — Z90710 Acquired absence of both cervix and uterus: Secondary | ICD-10-CM | POA: Diagnosis not present

## 2017-11-07 DIAGNOSIS — Z8541 Personal history of malignant neoplasm of cervix uteri: Secondary | ICD-10-CM

## 2017-11-07 NOTE — Progress Notes (Signed)
Consult Note: Gyn-Onc  Consult was requested by Dr. Matthew Saras for the evaluation of [name] 66 y.o. female  CC:  Chief Complaint  Patient presents with  . Cervical cancer, FIGO stage IB2 (HCC)    Assessment/Plan:  [name]  is a 66 y.o.  year old with stage IB2, lymph node positive endocervical adenocarcinoma.  s/p adjuvant chemotherapy and radiation (completed December, 2017).  NED on clinical exam.  Urinary incontinence - stable  Follow-up for cervical cancer: Dr Alvy Bimler in March, 2019, Dr Denman George in June, 2019.  Follow-up pap smear.  HPI: [name] is a 66 year old G3P3 who is seen in consultation at the request of Dr Matthew Saras for grade 2 endometrial cancer.   The patient reports a history of postmenopausal bleeding since June 2017. She underwent a sonohysterogram in Dr. Delanna Ahmadi office on 06/01/2016 that demonstrated a normal sized uterus measuring 6.5 x 2.6 x 2.6 cm but with an endometrial polyp measuring 2.4 cm. She was taken to the operating room on 07/06/2016 for a minor sure resection of the polyp. Final pathology from this revealed a grade 2 endometrioid adenocarcinoma.  The patient is otherwise a fairly healthy woman. Her major medical history significant for a horseback riding accident from which she required repair of L1-2 and 3 through a lateral approach on the left. She has a flank incision from this. She has persistent left lower extremity weakness, and paresthesia secondary to this. She also has chronic opiate use and takes Percocet 7.5 mg 3 times a day and Neurontin 3 times a day due to this pain.  She is a striking family history for malignancy. Her son died at age 7 of sarcoma. Her sister has a history of leukemia, and her mother also died of leukemia. She is maternal grandmother with a history of colon cancer and a maternal great aunt with history of colon cancer.  On 9.14.17 she underwent a robotic assisted total hysterectomy (extrafascial)  with SLN biopsy for presumed grade 2 endometrial cancer. Final pathology revealed a moderately differentiated endocervical adenocarcinoma. It infiltrated 10 of 34mm (66%) of the cervical stroma, was positive for LVSI, measured 4cm, 32mm margin with the paracervical tissues and was associated with 2 positive SLN's (1 ITC, 1 micrometastasis).   Postop PET scan in October, 2018 showed no frank metastatic disease, though there was a ground glass lesion in the RUL that needed repeat imaging for follow-up.  Interval Hx: She went on to complete chemoradiation with cddp weekly and EBRT 45 Gy in 25 fractions followed by brachytherapy 18Gy in 3 fractions (completed 11/10/17).  She had no issues since I last saw her.  Current Meds:  Outpatient Encounter Medications as of 11/07/2017  Medication Sig  . docusate sodium (COLACE) 100 MG capsule Take 100 mg by mouth 2 (two) times daily.  Marland Kitchen gabapentin (NEURONTIN) 300 MG capsule TAKE 2 CAPSULES BY MOUTH EVERY MORNING AND TAKE 3 CAPSULES BY MOUTH EVERY EVENING  . oxyCODONE-acetaminophen (PERCOCET) 7.5-325 MG tablet Take 2 tablets by mouth every 8 (eight) hours as needed for severe pain.   . polyethylene glycol (MIRALAX / GLYCOLAX) packet Take 17 g by mouth daily as needed for moderate constipation.  Marland Kitchen loperamide (IMODIUM A-D) 2 MG tablet Take 2-4 mg by mouth 4 (four) times daily as needed for diarrhea or loose stools.  . [DISCONTINUED] gabapentin (NEURONTIN) 300 MG capsule Take 3 capsules (900 mg total) by mouth 2 (two) times daily.   No facility-administered encounter medications on  file as of 11/07/2017.     Allergy: No Known Allergies  Social Hx:   Social History   Socioeconomic History  . Marital status: Divorced    Spouse name: Not on file  . Number of children: 3  . Years of education: Not on file  . Highest education level: Not on file  Social Needs  . Financial resource strain: Not on file  . Food insecurity - worry: Not on file  . Food  insecurity - inability: Not on file  . Transportation needs - medical: Not on file  . Transportation needs - non-medical: Not on file  Occupational History  . Not on file  Tobacco Use  . Smoking status: Never Smoker  . Smokeless tobacco: Never Used  Substance and Sexual Activity  . Alcohol use: No  . Drug use: No  . Sexual activity: Not on file  Other Topics Concern  . Not on file  Social History Narrative  . Not on file    Past Surgical Hx:  Past Surgical History:  Procedure Laterality Date  . HYSTEROSCOPY W/D&C N/A 07/06/2016   Procedure: DILATATION AND CURETTAGE /HYSTEROSCOPY;  Surgeon: Molli Posey, MD;  Location: [facility];  Service: Gynecology;  Laterality: N/A;  . INCISION AND DRAINAGE BREAST ABSCESS Left 1977  . IR GENERIC HISTORICAL  10/05/2016   IR FLUORO GUIDE CV LINE RIGHT 10/05/2016 Marybelle Killings, MD [facility]  . IR GENERIC HISTORICAL  10/05/2016   IR US GUIDE VASC ACCESS RIGHT 10/05/2016 Marybelle Killings, MD [facility]  . LUMBAR FUSION  2007   L1 -- L3  . MULTIPLE EXTRACTIONS WITH ALVEOLOPLASTY N/A 09/08/2016   Procedure: Extraction of tooth #'s 4,5,13,14,15,17,18,19,29 and 31 with alveoloplasty and gross debridement of remaining teeth;  Surgeon: Lenn Cal, DDS;  Location: [facility];  Service: Oral Surgery;  Laterality: N/A;  . ROBOTIC ASSISTED TOTAL HYSTERECTOMY WITH BILATERAL SALPINGO OOPHERECTOMY Bilateral 08/05/2016   Procedure: XI ROBOTIC ASSISTED TOTAL HYSTERECTOMY WITH BILATERAL SALPINGO OOPHORECTOMY SENTINAL LYMPH NODE BIOPSY;  Surgeon: Everitt Amber, MD;  Location: [facility];  Service: Gynecology;  Laterality: Bilateral;    Past Medical Hx:  Past Medical History:  Diagnosis Date  . Arthritis    HANDS  . Borderline hypertension    ON  MEDS WENT OFF 2016 DUE TO DID NOT LIKE MEDS  . Cancer ([place]) 2017   ENDO METRIAL CANCER  . Chronic low back pain   . Degenerative spinal arthritis   . Depression   . Endometrial polyp   . Family  history of adverse reaction to anesthesia    SISTER WITH SEVERE PONV  . Family history of bone cancer   . Family history of leukemia   . Numbness in left leg    DUE TO BACK INJURY 2007  . Osteoporosis   . PMB (postmenopausal bleeding)   . Pneumonia 2015  . Wears glasses     Past Gynecological History:  G3P3 SVD'x No LMP recorded. Patient has had a hysterectomy.  Family Hx:  Family History  Problem Relation Age of Onset  . Cancer Mother 43       leukemia  . Heart disease Father 2  . Leukemia Sister        hairy cell leukemia  . Cancer Son 25       osteosarcoma  . Heart attack Brother     Review of Systems:  Constitutional  Feels well,    ENT Normal appearing ears and nares bilaterally Skin/Breast  No  rash, sores, jaundice, itching, dryness Cardiovascular  No chest pain, shortness of breath, or edema  Pulmonary  No cough or wheeze.  Gastro Intestinal  No nausea, vomitting, or diarrhoea. No bright red blood per rectum, no abdominal pain, change in bowel movement, or constipation.  Genito Urinary  No frequency, urgency, dysuria, no bleeding Musculo Skeletal  No myalgia, arthralgia, joint swelling or pain  Neurologic  No weakness, numbness, change in gait,  Psychology  No depression, anxiety, insomnia.   Vitals:  Blood pressure (!) 142/89, pulse (!) 112, temperature 98.1 F (36.7 C), temperature source Oral, resp. rate 18, height 5\' 2"  (1.575 m), weight 138 lb 8 oz (62.8 kg), SpO2 98 %.  Physical Exam: WD in NAD Neck  Supple NROM, without any enlargements.  Lymph Node Survey No cervical supraclavicular or inguinal adenopathy Cardiovascular  Pulse normal rate, regularity and rhythm. S1 and S2 normal.  Lungs  Clear to auscultation bilateraly, without wheezes/crackles/rhonchi. Good air movement.  Skin  No rash/lesions/breakdown  Psychiatry  Alert and oriented to person, place, and time  Abdomen  Normoactive bowel sounds, abdomen soft, non-tender and  nonobese without evidence of hernia. Well healed incisions. Back No CVA tenderness Genito Urinary  Vulva/vagina: Normal external female genitalia.  No lesions. No discharge or bleeding. Bladder/urethra:  No lesions or masses, well supported bladder Vagina: normal well healed vaginal cuff with no blood or lesions.  Rectal  deferred Extremities  No bilateral cyanosis, clubbing or edema.  [name], MD  11/07/2017, 1:45 PM

## 2017-11-07 NOTE — Patient Instructions (Signed)
Please notify Dr Case Vassell at phone number [phone] if you notice vaginal bleeding, new pelvic or abdominal pains, bloating, feeling full easy, or a change in bladder or bowel function.  Please return to see Dr Artemus Romanoff in 6 months.  

## 2017-11-09 LAB — CYTOLOGY - PAP: HPV: NOT DETECTED

## 2017-11-10 ENCOUNTER — Telehealth: Payer: Self-pay

## 2017-11-10 DIAGNOSIS — G894 Chronic pain syndrome: Secondary | ICD-10-CM | POA: Diagnosis not present

## 2017-11-10 DIAGNOSIS — M961 Postlaminectomy syndrome, not elsewhere classified: Secondary | ICD-10-CM | POA: Diagnosis not present

## 2017-11-10 DIAGNOSIS — M79606 Pain in leg, unspecified: Secondary | ICD-10-CM | POA: Diagnosis not present

## 2017-11-10 DIAGNOSIS — M47817 Spondylosis without myelopathy or radiculopathy, lumbosacral region: Secondary | ICD-10-CM | POA: Diagnosis not present

## 2017-11-10 NOTE — Telephone Encounter (Signed)
LM for [name] that her PAP Smear was normal showing benign radiation changes.  She can call the office  if she has any questions.

## 2017-11-28 ENCOUNTER — Other Ambulatory Visit: Payer: Self-pay | Admitting: *Deleted

## 2017-11-28 MED ORDER — GABAPENTIN 300 MG PO CAPS: ORAL_CAPSULE | ORAL | 1 refills | Status: DC

## 2017-12-03 ENCOUNTER — Ambulatory Visit
Admission: RE | Admit: 2017-12-03 | Discharge: 2017-12-03 | Disposition: A | Discharge status: Home / Self Care | Payer: Medicare Other | Source: Ambulatory Visit | Attending: Pain Medicine | Admitting: Pain Medicine

## 2017-12-03 DIAGNOSIS — M25552 Pain in left hip: Secondary | ICD-10-CM

## 2017-12-09 ENCOUNTER — Telehealth: Payer: Self-pay | Admitting: *Deleted

## 2017-12-09 NOTE — Telephone Encounter (Signed)
Received voice mail message stating,"I go to a pain management clinic. They've done an MRI and found pelvic fractures. I have been referred to an orthopedic surgeon. My return number is [phone]."

## 2017-12-19 ENCOUNTER — Encounter (INDEPENDENT_AMBULATORY_CARE_PROVIDER_SITE_OTHER): Payer: Self-pay | Admitting: Orthopaedic Surgery

## 2017-12-19 ENCOUNTER — Ambulatory Visit (INDEPENDENT_AMBULATORY_CARE_PROVIDER_SITE_OTHER): Payer: Medicare Other | Admitting: Orthopaedic Surgery

## 2017-12-19 DIAGNOSIS — M25552 Pain in left hip: Secondary | ICD-10-CM | POA: Diagnosis not present

## 2017-12-19 MED ORDER — NABUMETONE 500 MG PO TABS: 500.0000 mg | ORAL_TABLET | Freq: Two times a day (BID) | ORAL | 0 refills | Status: DC | PRN

## 2017-12-19 NOTE — Progress Notes (Signed)
Office Visit Note   Patient: [name]           Date of Birth: February 20, 1951           MRN: [id number] Visit Date: 12/19/2017              Requested by: Dian Situ, MD [place], [place] [address] PCP: Deland Pretty, MD   Assessment & Plan: Visit Diagnoses:  1. Pain of left hip joint     Plan: From a surgical standpoint she does not need hip replacement surgery.  Really arthritis in her hips is only minimal.  Most of her pain is coming from a combination of the sacral insufficiency fractures and the insufficiency fracture of the pubis as well as the muscle strain.  Also she is on chronic narcotic pain medications that contributed to the pain centers being heightened from this type of pain.  The only thing from my standpoint that I can offer is anti-inflammatories such as Relafen which I will send in for her in time.  Certainly close follow-up with her primary care physician assessing her vitamin D levels is warranted and continuing medications for osteoporosis.  She should use her cane as well.  She will follow-up as needed.  All questions concerns were answered and addressed.  Follow-Up Instructions: Return if symptoms worsen or fail to improve.   Orders:  No orders of the defined types were placed in this encounter.  Meds ordered this encounter  Medications  . nabumetone (RELAFEN) 500 MG tablet    Sig: Take 1 tablet (500 mg total) by mouth 2 (two) times daily as needed.    Dispense:  60 tablet    Refill:  0      Procedures: No procedures performed   Clinical Data: No additional findings.   Subjective: Chief Complaint  Patient presents with  . Left Hip - Pain  . Lower Back - Pain  The patient is here for evaluation and treatment of left hip pain mainly.  She has a history of a cage in her lumbar spine from previous surgery and is on chronic oxycodone.  However she is been having pelvic pain and left hip pain more recent.  She is someone who had  to undergo a hysterectomy 16 months ago for cervical cancer and has been on radiation therapy as well.  An MRI has been obtained recently and she is sent to [place] for further evaluation and treatment after the MRI had some findings.  That is on the canopy system for me to review.  He does ambulate with a cane and is not active at all.  Her pain is daily and severe.  She does take oxycodone and Neurontin but no anti-inflammatories.  She has been taking these medications for years.  HPI  Review of Systems She currently denies any fever, chills, nausea, vomiting, chest pain, shortness of breath  Objective: Vital Signs: There were no vitals taken for this visit.  Physical Exam She is alert and oriented x3 and in no acute distress Ortho Exam Examination of her hips shows fluid internal/external rotation no blocks of his rotation.  She does hurt more on the left than the right but she does have pain more in the sacrum on both sides and more over the pubis. Specialty Comments:  No specialty comments available.  Imaging: No results found. X-rays independently reviewed of both hips and pelvis showed no obvious abnormalities or acute findings.  Both hips are very well  located with normal appearing joint spaces.  The MRI however does show evidence of sacral insufficiency fractures bilaterally as well as a insufficiency fracture of the pubis on the left side with edema in the muscles on the side.  There is only mild arthritic changes in her hips  PMFS History: Patient Active Problem List   Diagnosis Date Noted  . 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 Cervical cancer ([place]) 03/11/2017  . 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 Chronic pain disorder 03/11/2017  . Urinary incontinence 03/11/2017  . Family history of bone cancer   . Family history of leukemia   . Antineoplastic chemotherapy induced pancytopenia (CODE) ([place]) 10/29/2016  . Hypomagnesemia 10/26/2016  . Chemotherapy-induced  thrombocytopenia 10/26/2016  . Chemotherapy induced neutropenia ([place]) 10/26/2016  . High risk medication use 10/13/2016  . Osteoporosis without current pathological fracture 09/28/2016  . Dental caries 09/02/2016  . Chronic periodontitis 09/02/2016  . Chronic pain due to injury 08/29/2016  . Constipation due to pain medication 08/29/2016  . Cervical cancer, FIGO stage IB2 ([place]) 08/23/2016   Past Medical History:  Diagnosis Date  . Arthritis    HANDS  . Borderline hypertension    ON  MEDS WENT OFF 2016 DUE TO DID NOT LIKE MEDS  . Cancer ([place]) 2017   ENDO METRIAL CANCER  . Chronic low back pain   . Degenerative spinal arthritis   . Depression   . Endometrial polyp   . Family history of adverse reaction to anesthesia    SISTER WITH SEVERE PONV  . Family history of bone cancer   . Family history of leukemia   . Numbness in left leg    DUE TO BACK INJURY 2007  . Osteoporosis   . PMB (postmenopausal bleeding)   . Pneumonia 2015  . Wears glasses     Family History  Problem Relation Age of Onset  . Cancer Mother 26       leukemia  . Heart disease Father 32  . Leukemia Sister        hairy cell leukemia  . Cancer Son 25       osteosarcoma  . Heart attack Brother     Past Surgical History:  Procedure Laterality Date  . HYSTEROSCOPY W/D&C N/A 07/06/2016   Procedure: DILATATION AND CURETTAGE /HYSTEROSCOPY;  Surgeon: Molli Posey, MD;  Location: [facility];  Service: Gynecology;  Laterality: N/A;  . INCISION AND DRAINAGE BREAST ABSCESS Left 1977  . IR GENERIC HISTORICAL  10/05/2016   IR FLUORO GUIDE CV LINE RIGHT 10/05/2016 Marybelle Killings, MD [facility]  . IR GENERIC HISTORICAL  10/05/2016   IR US GUIDE VASC ACCESS RIGHT 10/05/2016 Marybelle Killings, MD [facility]  . LUMBAR FUSION  2007   L1 -- L3  . MULTIPLE EXTRACTIONS WITH ALVEOLOPLASTY N/A 09/08/2016   Procedure: Extraction of tooth #'s 4,5,13,14,15,17,18,19,29 and 31 with alveoloplasty and gross  debridement of remaining teeth;  Surgeon: Lenn Cal, DDS;  Location: [facility];  Service: Oral Surgery;  Laterality: N/A;  . ROBOTIC ASSISTED TOTAL HYSTERECTOMY WITH BILATERAL SALPINGO OOPHERECTOMY Bilateral 08/05/2016   Procedure: XI ROBOTIC ASSISTED TOTAL HYSTERECTOMY WITH BILATERAL SALPINGO OOPHORECTOMY SENTINAL LYMPH NODE BIOPSY;  Surgeon: Everitt Amber, MD;  Location: [facility];  Service: Gynecology;  Laterality: Bilateral;   Social History   Occupational History  . Not on file  Tobacco Use  . Smoking status: Never Smoker  . Smokeless tobacco: Never Used  Substance and Sexual Activity  . Alcohol use:  No  . Drug use: No  . Sexual activity: Not on file

## 2018-01-31 ENCOUNTER — Encounter: Payer: Self-pay | Admitting: Oncology

## 2018-02-02 ENCOUNTER — Ambulatory Visit: Payer: Medicare Other | Admitting: Radiation Oncology

## 2018-02-06 ENCOUNTER — Encounter: Payer: Self-pay | Admitting: Hematology and Oncology

## 2018-02-06 ENCOUNTER — Inpatient Hospital Stay: Payer: Medicare Other | Attending: Hematology and Oncology | Admitting: Hematology and Oncology

## 2018-02-06 DIAGNOSIS — M810 Age-related osteoporosis without current pathological fracture: Secondary | ICD-10-CM

## 2018-02-06 DIAGNOSIS — G620 Drug-induced polyneuropathy: Secondary | ICD-10-CM | POA: Diagnosis not present

## 2018-02-06 DIAGNOSIS — Z9221 Personal history of antineoplastic chemotherapy: Secondary | ICD-10-CM | POA: Insufficient documentation

## 2018-02-06 DIAGNOSIS — G894 Chronic pain syndrome: Secondary | ICD-10-CM

## 2018-02-06 DIAGNOSIS — G8929 Other chronic pain: Secondary | ICD-10-CM | POA: Insufficient documentation

## 2018-02-06 DIAGNOSIS — R918 Other nonspecific abnormal finding of lung field: Secondary | ICD-10-CM | POA: Diagnosis not present

## 2018-02-06 DIAGNOSIS — Z90710 Acquired absence of both cervix and uterus: Secondary | ICD-10-CM | POA: Insufficient documentation

## 2018-02-06 DIAGNOSIS — Z90722 Acquired absence of ovaries, bilateral: Secondary | ICD-10-CM | POA: Diagnosis not present

## 2018-02-06 DIAGNOSIS — Z79899 Other long term (current) drug therapy: Secondary | ICD-10-CM | POA: Insufficient documentation

## 2018-02-06 DIAGNOSIS — T451X5S Adverse effect of antineoplastic and immunosuppressive drugs, sequela: Secondary | ICD-10-CM | POA: Insufficient documentation

## 2018-02-06 DIAGNOSIS — Z923 Personal history of irradiation: Secondary | ICD-10-CM | POA: Insufficient documentation

## 2018-02-06 DIAGNOSIS — Z8541 Personal history of malignant neoplasm of cervix uteri: Secondary | ICD-10-CM | POA: Diagnosis not present

## 2018-02-06 DIAGNOSIS — T451X5A Adverse effect of antineoplastic and immunosuppressive drugs, initial encounter: Secondary | ICD-10-CM

## 2018-02-06 DIAGNOSIS — C530 Malignant neoplasm of endocervix: Secondary | ICD-10-CM

## 2018-02-06 NOTE — Assessment & Plan Note (Addendum)
Her peripheral neuropathy is stable on gabapentin I renewed her prescription recently I recommend future refill with primary care doctor I recommend addition of vitamin B complex

## 2018-02-06 NOTE — Assessment & Plan Note (Signed)
She has chronic pain disorder and is being managed by pain specialist I recommend consideration for high dose vitamin D supplement, especially in view of prior diagnosis of osteoporosis and history of bone fracture MRI showed degenerative joint disease

## 2018-02-06 NOTE — Assessment & Plan Note (Signed)
Clinically, she is doing well with no signs or symptoms to suggest cancer recurrence I recommend follow-up with GYN oncologist only

## 2018-02-06 NOTE — Progress Notes (Signed)
[facility] OFFICE PROGRESS NOTE  Patient Care Team: Deland Pretty, MD as PCP - General (Internal Medicine)  ASSESSMENT & PLAN:  Cervical cancer [facility]) Clinically, she is doing well with no signs or symptoms to suggest cancer recurrence I recommend follow-up with GYN oncologist only   Peripheral neuropathy due to chemotherapy [facility]) Her peripheral neuropathy is stable on gabapentin I renewed her prescription recently I recommend future refill with primary care doctor I recommend addition of vitamin B complex   Chronic pain disorder She has chronic pain disorder and is being managed by pain specialist I recommend consideration for high dose vitamin D supplement, especially in view of prior diagnosis of osteoporosis and history of bone fracture MRI showed degenerative joint disease   No orders of the defined types were placed in this encounter.   INTERVAL HISTORY: Please see below for problem oriented charting. She returns for further follow-up She continues to have severe pelvic pain She denies recent infection No vaginal bleeding Her appetite is stable, no recent weight loss Her peripheral neuropathy is stable on Gabapentin  SUMMARY OF ONCOLOGIC HISTORY:   Cervical cancer ([place])   06/01/2016 Initial Diagnosis    Patient had intermittent vaginal spotting beginning ~ 04-2016. She had sonohystogram by Dr Matthew Saras 06-01-16 with 2.4 cm endometrial polyp identified.      07/06/2016 Surgery    Procedure: Hysteroscopy, D&C      07/06/2016 Pathology Results    Endometrium, curettage - ENDOMETRIOID ADENOCARCINOMA, SEE COMMENT. Microscopic Comment The carcinoma appears FIGO grade 2.       08/05/2016 Surgery    Operation: Robotic-assisted laparoscopic total hysterectomy with bilateral salpingoophorectomy, sentinel lymph node biopsy   Operative Findings:  : 6cm normal appearing uterus, tubes and ovaries. No suspicious nodes.      08/05/2016 Pathology Results     1. Lymph node, sentinel, biopsy, right external iliac - ISOLATED TUMOR CELLS. - SEE MICROSCOPIC DESCRIPTION. 2. Lymph node, sentinel, biopsy, left external iliac - MICROMETASTATIC CARCINOMA IN ONE LYMPH NODE (1/1). 3. Uterus, ovaries and fallopian tubes, cervix - INVASIVE ADENOCARCINOMA INVOLVING ENDOCERVIX AND ENDOCERVICAL CANAL. - SEE ONCOLOGY TABLE AND COMMENT. Microscopic Comment 3. UTERINE CERVIX Specimen: Uterus with bilateral fallopian tubes and ovaries and right and left external iliac sentinel lymph nodes. Procedure: Hysterectomy with bilateral salpingo-oophorectomy and sentinel lymph node biopsies. Tumor site (quadrant if known): Endocervix and endocervical canal Maximum tumor size (cm): 4 cm. Histologic type: Adenocarcinoma, see comment. Grade: Intermediate. Margins: Free of tumor. Distance of carcinoma from closest margin: 0.2 cm from parametrial soft tissue margin. Depth of invasion: 10 mm in depth where cervix is 15 mm in thickness. Horizontal extent (mm): 40 mm (4 cm) Lymph-Vascular invasion: Present. Vaginal extension: N/A Uterine corpus extension: Extends into high endocervical canal and focally lower uterine segment. Parametrial involvement: No. Lymph nodes: number examined - 2 ; number positive- 2 TNM code: pT1b2, pN1, pMX FIGO Stage (based on pathologic findings, need clinical correlation): IIIB Comment: The tumor is 4 cm in greatest dimension and involves the endocervix and endocervical canal and invades the cervical stroma to a depth of 1 cm where the cervix is measured as 1.5 cm in thickness. The tumor has villoglandular and endometrioid features. Immunohistochemistry shows diffuse strong positivity with p16 and patchy positivity with carcinoembryonic antigen while the tumor is negative with p53, estrogen receptor, progesterone receptor, and vimentin. The immunophenotype is more typical of an endocervical adenocarcinoma. In addition the entire endomyometrium is  examined and no involvement by carcinoma of  the endometrium is identified. There are a few microscopic foci with features suggesting precursor adenocarcinoma in situ. Immunohistochemistry for cytokeratin AE1/AE3 is performed and the right external iliac node shows positivity consistent with isolated tumor cells and the left external iliac node shows positive consistent with microscopic foci of metastatic carcinoma. No involvement of the endometrium, uterine serosa, bilateral ovaries and bilateral fallopian tubes is identified.      08/31/2016 PET scan    1. No hypermetabolic metastatic disease identified. 2. 8 mm right upper lobe ground-glass opacity. Consider dedicated chest CT to serve as a baseline and allow surveillance. This could represent an area of post infectious/inflammatory scarring or an indolent neoplasm such as low-grade adenocarcinoma. 3.  Possible constipation. 4. Hypermetabolism in the left mandible, likely correlated with a mandibular periapical and lucency.      09/08/2016 Surgery    She had multiple extraction of tooth numbers 4, 5,13, 14, 15, 17,18,19, 29, and 31, 4 Quadrants of alveoloplasty and Gross debridement of remaining dentition      09/23/2016 - 11/10/2016 Radiation Therapy    Radiation treatment dates:   09/23/16 - 10/28/16  ;  11/01/16 - 11/10/16  Site/dose:   The Pelvis was treated to 45 Gy in 25 fractions of 1.8 Gy. The patient then underwent vaginal brachytherapy, and the Vaginal Cuff was treated to 18 Gy in 3 fractions of 6 Gy.  Beams/energy:   Pelvis : IMRT  //  6X                              VagCuff : HDR-Vaginal  //  Iridium HDR       09/27/2016 - 10/18/2016 Chemotherapy    She had weekly cisplatin       10/05/2016 Procedure    Successful placement of a right arm PICC with sonographic and fluoroscopic guidance. The catheter is ready for use.      03/31/2017 Imaging    Ct chest 1. Interval resolution of the 8 mm right upper lobe ground-glass  nodule, consistent with infectious/inflammatory etiology on prior study. 2. Otherwise unremarkable CT exam of the chest.       REVIEW OF SYSTEMS:   Constitutional: Denies fevers, chills or abnormal weight loss Eyes: Denies blurriness of vision Ears, nose, mouth, throat, and face: Denies mucositis or sore throat Respiratory: Denies cough, dyspnea or wheezes Cardiovascular: Denies palpitation, chest discomfort or lower extremity swelling Gastrointestinal:  Denies nausea, heartburn or change in bowel habits Skin: Denies abnormal skin rashes Lymphatics: Denies new lymphadenopathy or easy bruising Neurological:Denies numbness, tingling or new weaknesses Behavioral/Psych: Mood is stable, no new changes  All other systems were reviewed with the patient and are negative.  I have reviewed the past medical history, past surgical history, social history and family history with the patient and they are unchanged from previous note.  ALLERGIES:  has No Known Allergies.  MEDICATIONS:  Current Outpatient Medications  Medication Sig Dispense Refill  . cholecalciferol (VITAMIN D) 1000 units tablet Take 2,000 Units by mouth daily.    Marland Kitchen docusate sodium (COLACE) 100 MG capsule Take 100 mg by mouth 2 (two) times daily.    Marland Kitchen gabapentin (NEURONTIN) 300 MG capsule TAKE 2 CAPSULES BY MOUTH EVERY MORNING AND TAKE 3 CAPSULES BY MOUTH EVERY EVENING 150 capsule 1  . loperamide (IMODIUM A-D) 2 MG tablet Take 2-4 mg by mouth 4 (four) times daily as needed for diarrhea or loose stools.    Marland Kitchen  nabumetone (RELAFEN) 500 MG tablet Take 1 tablet (500 mg total) by mouth 2 (two) times daily as needed. 60 tablet 0  . oxyCODONE-acetaminophen (PERCOCET) 7.5-325 MG tablet Take 2 tablets by mouth every 8 (eight) hours as needed for severe pain.     . polyethylene glycol (MIRALAX / GLYCOLAX) packet Take 17 g by mouth daily as needed for moderate constipation.     No current facility-administered medications for this visit.      PHYSICAL EXAMINATION: ECOG PERFORMANCE STATUS: 2 - Symptomatic, <50% confined to bed  Vitals:   02/06/18 1303  BP: (!) 132/94  Pulse: 99  Resp: 18  Temp: 97.8 F (36.6 C)  SpO2: 98%   Filed Weights   02/06/18 1303  Weight: 145 lb (65.8 kg)    GENERAL:alert, no distress and comfortable SKIN: skin color, texture, turgor are normal, no rashes or significant lesions EYES: normal, Conjunctiva are pink and non-injected, sclera clear OROPHARYNX:no exudate, no erythema and lips, buccal mucosa, and tongue normal  NECK: supple, thyroid normal size, non-tender, without nodularity LYMPH:  no palpable lymphadenopathy in the cervical, axillary or inguinal LUNGS: clear to auscultation and percussion with normal breathing effort HEART: regular rate & rhythm and no murmurs and no lower extremity edema ABDOMEN:abdomen soft, non-tender and normal bowel sounds Musculoskeletal:no cyanosis of digits and no clubbing  NEURO: alert & oriented x 3 with fluent speech, no focal motor/sensory deficits  LABORATORY DATA:  I have reviewed the data as listed    Component Value Date/Time   NA 144 11/29/2016 1119   K 4.0 11/29/2016 1119   CL 103 08/06/2016 0447   CO2 27 11/29/2016 1119   GLUCOSE 104 11/29/2016 1119   BUN 13.6 11/29/2016 1119   CREATININE 1.00 03/31/2017 1554   CREATININE 0.9 11/29/2016 1119   CALCIUM 9.4 11/29/2016 1119   PROT 6.4 11/29/2016 1119   ALBUMIN 3.8 11/29/2016 1119   AST 26 11/29/2016 1119   ALT 20 11/29/2016 1119   ALKPHOS 102 11/29/2016 1119   BILITOT 0.33 11/29/2016 1119   GFRNONAA >60 08/06/2016 0447   GFRAA >60 08/06/2016 0447    No results found for: SPEP, UPEP  Lab Results  Component Value Date   WBC 5.2 07/07/2017   NEUTROABS 3.1 07/07/2017   HGB 12.9 07/07/2017   HCT 37.7 07/07/2017   MCV 99.5 07/07/2017   PLT 146 07/07/2017      Chemistry      Component Value Date/Time   NA 144 11/29/2016 1119   K 4.0 11/29/2016 1119   CL 103 08/06/2016  0447   CO2 27 11/29/2016 1119   BUN 13.6 11/29/2016 1119   CREATININE 1.00 03/31/2017 1554   CREATININE 0.9 11/29/2016 1119      Component Value Date/Time   CALCIUM 9.4 11/29/2016 1119   ALKPHOS 102 11/29/2016 1119   AST 26 11/29/2016 1119   ALT 20 11/29/2016 1119   BILITOT 0.33 11/29/2016 1119      All questions were answered. The patient knows to call the clinic with any problems, questions or concerns. No barriers to learning was detected.  I spent 10 minutes counseling the patient face to face. The total time spent in the appointment was 15 minutes and more than 50% was on counseling and review of test results  Heath Lark, MD 02/06/2018 2:05 PM

## 2018-04-15 ENCOUNTER — Other Ambulatory Visit: Payer: Self-pay | Admitting: Hematology and Oncology

## 2018-04-26 ENCOUNTER — Telehealth: Payer: Self-pay | Admitting: *Deleted

## 2018-04-26 NOTE — Telephone Encounter (Signed)
Called and left message to call the office back. Need to move appt from June 19th to June 10th

## 2018-04-27 ENCOUNTER — Encounter (HOSPITAL_COMMUNITY): Payer: Self-pay | Admitting: Student

## 2018-04-27 ENCOUNTER — Emergency Department (HOSPITAL_COMMUNITY): Payer: Medicare Other

## 2018-04-27 ENCOUNTER — Other Ambulatory Visit: Payer: Self-pay

## 2018-04-27 ENCOUNTER — Inpatient Hospital Stay (HOSPITAL_COMMUNITY)
Admission: EM | Admit: 2018-04-27 | Discharge: 2018-05-03 | DRG: 493 | Disposition: A | Discharge status: Skilled Nursing Facility | Payer: Medicare Other | Attending: Student | Admitting: Student

## 2018-04-27 DIAGNOSIS — Z79891 Long term (current) use of opiate analgesic: Secondary | ICD-10-CM

## 2018-04-27 DIAGNOSIS — T1490XA Injury, unspecified, initial encounter: Secondary | ICD-10-CM

## 2018-04-27 DIAGNOSIS — S82209A Unspecified fracture of shaft of unspecified tibia, initial encounter for closed fracture: Secondary | ICD-10-CM

## 2018-04-27 DIAGNOSIS — S82832A Other fracture of upper and lower end of left fibula, initial encounter for closed fracture: Secondary | ICD-10-CM

## 2018-04-27 DIAGNOSIS — S82202A Unspecified fracture of shaft of left tibia, initial encounter for closed fracture: Secondary | ICD-10-CM | POA: Diagnosis present

## 2018-04-27 DIAGNOSIS — F32A Depression, unspecified: Secondary | ICD-10-CM | POA: Diagnosis present

## 2018-04-27 DIAGNOSIS — G8929 Other chronic pain: Secondary | ICD-10-CM | POA: Diagnosis present

## 2018-04-27 DIAGNOSIS — R509 Fever, unspecified: Secondary | ICD-10-CM

## 2018-04-27 DIAGNOSIS — D62 Acute posthemorrhagic anemia: Secondary | ICD-10-CM | POA: Diagnosis not present

## 2018-04-27 DIAGNOSIS — T148XXA Other injury of unspecified body region, initial encounter: Secondary | ICD-10-CM

## 2018-04-27 DIAGNOSIS — W19XXXA Unspecified fall, initial encounter: Secondary | ICD-10-CM

## 2018-04-27 DIAGNOSIS — R52 Pain, unspecified: Secondary | ICD-10-CM

## 2018-04-27 DIAGNOSIS — F329 Major depressive disorder, single episode, unspecified: Secondary | ICD-10-CM | POA: Diagnosis present

## 2018-04-27 DIAGNOSIS — M1990 Unspecified osteoarthritis, unspecified site: Secondary | ICD-10-CM | POA: Diagnosis present

## 2018-04-27 DIAGNOSIS — S82192A Other fracture of upper end of left tibia, initial encounter for closed fracture: Secondary | ICD-10-CM

## 2018-04-27 DIAGNOSIS — Z79899 Other long term (current) drug therapy: Secondary | ICD-10-CM | POA: Diagnosis not present

## 2018-04-27 DIAGNOSIS — W109XXA Fall (on) (from) unspecified stairs and steps, initial encounter: Secondary | ICD-10-CM | POA: Diagnosis present

## 2018-04-27 DIAGNOSIS — Z9889 Other specified postprocedural states: Secondary | ICD-10-CM | POA: Diagnosis not present

## 2018-04-27 DIAGNOSIS — S82252A Displaced comminuted fracture of shaft of left tibia, initial encounter for closed fracture: Secondary | ICD-10-CM | POA: Diagnosis present

## 2018-04-27 DIAGNOSIS — M545 Low back pain: Secondary | ICD-10-CM

## 2018-04-27 DIAGNOSIS — M47819 Spondylosis without myelopathy or radiculopathy, site unspecified: Secondary | ICD-10-CM | POA: Diagnosis present

## 2018-04-27 DIAGNOSIS — M810 Age-related osteoporosis without current pathological fracture: Secondary | ICD-10-CM | POA: Diagnosis present

## 2018-04-27 LAB — BASIC METABOLIC PANEL
Anion gap: 9 (ref 5–15)
BUN: 11 mg/dL (ref 6–20)
CALCIUM: 9.3 mg/dL (ref 8.9–10.3)
CO2: 27 mmol/L (ref 22–32)
CREATININE: 1.16 mg/dL — AB (ref 0.44–1.00)
Chloride: 105 mmol/L (ref 101–111)
GFR calc Af Amer: 56 mL/min — ABNORMAL LOW (ref >60)
GFR, EST NON AFRICAN AMERICAN: 49 mL/min — AB (ref >60)
GLUCOSE: 110 mg/dL — AB (ref 65–99)
Potassium: 5.1 mmol/L (ref 3.5–5.1)
SODIUM: 141 mmol/L (ref 135–145)

## 2018-04-27 LAB — CBC
HEMATOCRIT: 33.5 % — AB (ref 36.0–46.0)
HEMOGLOBIN: 10.7 g/dL — AB (ref 12.0–15.0)
MCH: 32.6 pg (ref 26.0–34.0)
MCHC: 31.9 g/dL (ref 30.0–36.0)
MCV: 102.1 fL — ABNORMAL HIGH (ref 78.0–100.0)
PLATELETS: 175 10*3/uL (ref 150–400)
RBC: 3.28 MIL/uL — ABNORMAL LOW (ref 3.87–5.11)
RDW: 11.9 % (ref 11.5–15.5)
WBC: 9 10*3/uL (ref 4.0–10.5)

## 2018-04-27 MED ORDER — ONDANSETRON HCL 4 MG PO TABS: 4.0000 mg | ORAL_TABLET | Freq: Four times a day (QID) | ORAL | Status: DC | PRN

## 2018-04-27 MED ORDER — VITAMIN D 1000 UNITS PO TABS
2000.0000 [IU] | ORAL_TABLET | Freq: Every day | ORAL | Status: DC
  Administered 2018-04-29 – 2018-05-03 (×5): 2000 [IU] via ORAL
  Filled 2018-04-27 (×6): qty 2

## 2018-04-27 MED ORDER — HYDROMORPHONE HCL 2 MG/ML IJ SOLN
0.5000 mg | Freq: Once | INTRAMUSCULAR | Status: AC
  Administered 2018-04-27: 0.5 mg via INTRAVENOUS
  Filled 2018-04-27: qty 1

## 2018-04-27 MED ORDER — POTASSIUM CHLORIDE IN NACL 20-0.45 MEQ/L-% IV SOLN
INTRAVENOUS | Status: DC
  Administered 2018-04-27 – 2018-04-29 (×4): via INTRAVENOUS
  Filled 2018-04-27 (×3): qty 1000

## 2018-04-27 MED ORDER — OXYCODONE-ACETAMINOPHEN 5-325 MG PO TABS
2.0000 | ORAL_TABLET | Freq: Four times a day (QID) | ORAL | Status: DC | PRN
  Administered 2018-04-28 – 2018-05-03 (×12): 2 via ORAL
  Filled 2018-04-27 (×13): qty 2

## 2018-04-27 MED ORDER — METHOCARBAMOL 1000 MG/10ML IJ SOLN
500.0000 mg | Freq: Four times a day (QID) | INTRAVENOUS | Status: DC | PRN
  Filled 2018-04-27: qty 5

## 2018-04-27 MED ORDER — ACETAMINOPHEN 325 MG PO TABS
650.0000 mg | ORAL_TABLET | Freq: Four times a day (QID) | ORAL | Status: DC | PRN
  Administered 2018-05-02: 650 mg via ORAL
  Filled 2018-04-27: qty 2

## 2018-04-27 MED ORDER — OXYCODONE-ACETAMINOPHEN 5-325 MG PO TABS
1.0000 | ORAL_TABLET | ORAL | Status: DC | PRN
  Administered 2018-04-29 – 2018-05-02 (×4): 1 via ORAL
  Filled 2018-04-27 (×7): qty 1

## 2018-04-27 MED ORDER — ONDANSETRON HCL 4 MG/2ML IJ SOLN
4.0000 mg | Freq: Once | INTRAMUSCULAR | Status: AC
  Administered 2018-04-27: 4 mg via INTRAVENOUS
  Filled 2018-04-27: qty 2

## 2018-04-27 MED ORDER — DOCUSATE SODIUM 100 MG PO CAPS
100.0000 mg | ORAL_CAPSULE | Freq: Two times a day (BID) | ORAL | Status: DC
  Administered 2018-04-27 – 2018-05-03 (×11): 100 mg via ORAL
  Filled 2018-04-27 (×11): qty 1

## 2018-04-27 MED ORDER — ONDANSETRON HCL 4 MG/2ML IJ SOLN
4.0000 mg | Freq: Four times a day (QID) | INTRAMUSCULAR | Status: DC | PRN
  Administered 2018-04-29: 4 mg via INTRAVENOUS
  Filled 2018-04-27 (×2): qty 2

## 2018-04-27 MED ORDER — HYDROMORPHONE HCL 2 MG/ML IJ SOLN
1.0000 mg | Freq: Once | INTRAMUSCULAR | Status: AC
  Administered 2018-04-27: 1 mg via INTRAVENOUS
  Filled 2018-04-27: qty 1

## 2018-04-27 MED ORDER — DIPHENHYDRAMINE HCL 12.5 MG/5ML PO ELIX: 12.5000 mg | ORAL_SOLUTION | ORAL | Status: DC | PRN

## 2018-04-27 MED ORDER — ACETAMINOPHEN 650 MG RE SUPP: 650.0000 mg | Freq: Four times a day (QID) | RECTAL | Status: DC | PRN

## 2018-04-27 MED ORDER — METHOCARBAMOL 500 MG PO TABS
500.0000 mg | ORAL_TABLET | Freq: Four times a day (QID) | ORAL | Status: DC | PRN
  Administered 2018-04-27 – 2018-05-03 (×9): 500 mg via ORAL
  Filled 2018-04-27 (×9): qty 1

## 2018-04-27 MED ORDER — HYDROMORPHONE HCL 2 MG/ML IJ SOLN
1.0000 mg | INTRAMUSCULAR | Status: DC | PRN
  Administered 2018-04-28 – 2018-05-02 (×15): 1 mg via INTRAVENOUS
  Filled 2018-04-27 (×15): qty 1

## 2018-04-27 MED ORDER — GABAPENTIN 300 MG PO CAPS
300.0000 mg | ORAL_CAPSULE | Freq: Three times a day (TID) | ORAL | Status: DC
  Administered 2018-04-27 – 2018-05-03 (×15): 300 mg via ORAL
  Filled 2018-04-27 (×16): qty 1

## 2018-04-27 MED ORDER — ACETAMINOPHEN 500 MG PO TABS
500.0000 mg | ORAL_TABLET | Freq: Two times a day (BID) | ORAL | Status: DC
  Administered 2018-04-27 – 2018-05-01 (×7): 500 mg via ORAL
  Filled 2018-04-27 (×11): qty 1

## 2018-04-27 NOTE — ED Triage Notes (Signed)
Pt was at home and missed two steps and fell down the steps and fell to both her knees. Visual deformity on the left leg. Pt is alert x 4. Pt stated she did not fall and hit her head. No LOC.132/88 80 HR. pT WAS GIVEN 250 FENTANYL.

## 2018-04-27 NOTE — ED Provider Notes (Signed)
[facility] EMERGENCY DEPARTMENT Provider Note   CSN: [id number] Arrival date & time: 04/27/18  1810     History   Chief Complaint No chief complaint on file.   HPI [name] is a 67 y.o. female.  Patient presents to the emergency department with acute onset of bilateral lower extremity pain with deformity to the left shin area sustained when she fell down approximately 2-3 steps at home.  Patient had previously diagnosed pelvic fracture from which she continues to have pain.  She does not report hitting her head or losing consciousness.  Patient was placed in a Sam splint by EMS and she was given 250 mcg of fentanyl.  Pain is currently 22 out of 10.     Past Medical History:  Diagnosis Date  . Arthritis    HANDS  . Borderline hypertension    ON  MEDS WENT OFF 2016 DUE TO DID NOT LIKE MEDS  . Cancer ([place]) 2017   ENDO METRIAL CANCER  . Chronic low back pain   . Degenerative spinal arthritis   . Depression   . Endometrial polyp   . Family history of adverse reaction to anesthesia    SISTER WITH SEVERE PONV  . Family history of bone cancer   . Family history of leukemia   . History of radiation therapy 09/23/16-10/28/16   pelvis 45 Gy in 25 fractions  . History of radiation therapy 12/11/7-10/2016   vaginal brachytherapy to vaginal cuff 18 Gy in 3 fractions  . Numbness in left leg    DUE TO BACK INJURY 2007  . Osteoporosis   . PMB (postmenopausal bleeding)   . Pneumonia 2015  . Wears glasses     Patient Active Problem List   Diagnosis Date Noted  . 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 Cervical cancer ([place]) 03/11/2017  . Peripheral neuropathy due to chemotherapy ([place]) 03/11/2017  . Chronic pain disorder 03/11/2017  . Urinary incontinence 03/11/2017  . Family history of bone cancer   . Family history of leukemia   . Antineoplastic chemotherapy induced pancytopenia (CODE) ([place]) 10/29/2016  . Hypomagnesemia 10/26/2016  . Chemotherapy-induced  thrombocytopenia 10/26/2016  . Chemotherapy induced neutropenia ([place]) 10/26/2016  . High risk medication use 10/13/2016  . Osteoporosis without current pathological fracture 09/28/2016  . Dental caries 09/02/2016  . Chronic periodontitis 09/02/2016  . Chronic pain due to injury 08/29/2016  . Constipation due to pain medication 08/29/2016  . Cervical cancer, FIGO stage IB2 ([place]) 08/23/2016    Past Surgical History:  Procedure Laterality Date  . HYSTEROSCOPY W/D&C N/A 07/06/2016   Procedure: DILATATION AND CURETTAGE /HYSTEROSCOPY;  Surgeon: Molli Posey, MD;  [facility];  Service: Gynecology;  Laterality: N/A;  . INCISION AND DRAINAGE BREAST ABSCESS Left 1977  . IR GENERIC HISTORICAL  10/05/2016   IR FLUORO GUIDE CV LINE RIGHT 10/05/2016 Marybelle Killings, MD [facility]  . IR GENERIC HISTORICAL  10/05/2016   IR US GUIDE VASC ACCESS RIGHT 10/05/2016 Marybelle Killings, MD [facility]  . LUMBAR FUSION  2007   L1 -- L3  . MULTIPLE EXTRACTIONS WITH ALVEOLOPLASTY N/A 09/08/2016   Procedure: Extraction of tooth #'s 4,5,13,14,15,17,18,19,29 and 31 with alveoloplasty and gross debridement of remaining teeth;  Surgeon: Lenn Cal, DDS;  Location: [facility];  Service: Oral Surgery;  Laterality: N/A;  . ROBOTIC ASSISTED TOTAL HYSTERECTOMY WITH BILATERAL SALPINGO OOPHERECTOMY Bilateral 08/05/2016   Procedure: XI ROBOTIC ASSISTED TOTAL HYSTERECTOMY WITH BILATERAL SALPINGO OOPHORECTOMY SENTINAL LYMPH NODE BIOPSY;  Surgeon: Everitt Amber, MD;  Location: [facility];  Service: Gynecology;  Laterality: Bilateral;     OB History   None      Home Medications    Prior to Admission medications   Medication Sig Start Date End Date Taking? Authorizing Provider  cholecalciferol (VITAMIN D) 1000 units tablet Take 2,000 Units by mouth daily.    [provider]  docusate sodium (COLACE) 100 MG capsule Take 100 mg by mouth 2 (two) times daily.    [provider]  gabapentin  (NEURONTIN) 300 MG capsule TAKE 2 CAPSULES BY MOUTH IN THE MORNING, AND TAKE 3 CAPSULES BY MOUTH IN THE EVENING 04/18/18   Heath Lark, MD  loperamide (IMODIUM A-D) 2 MG tablet Take 2-4 mg by mouth 4 (four) times daily as needed for diarrhea or loose stools.    [provider]  nabumetone (RELAFEN) 500 MG tablet Take 1 tablet (500 mg total) by mouth 2 (two) times daily as needed. 12/19/17   Mcarthur Rossetti, MD  oxyCODONE-acetaminophen (PERCOCET) 7.5-325 MG tablet Take 2 tablets by mouth every 8 (eight) hours as needed for severe pain.     [provider]  polyethylene glycol (MIRALAX / GLYCOLAX) packet Take 17 g by mouth daily as needed for moderate constipation.    [provider]    Family History Family History  Problem Relation Age of Onset  . Cancer Mother 56       leukemia  . Heart disease Father 51  . Leukemia Sister        hairy cell leukemia  . Cancer Son 25       osteosarcoma  . Heart attack Brother     Social History Social History   Tobacco Use  . Smoking status: Never Smoker  . Smokeless tobacco: Never Used  Substance Use Topics  . Alcohol use: No  . Drug use: No     Allergies   Patient has no known allergies.   Review of Systems Review of Systems  Constitutional: Negative for fever.  HENT: Negative for rhinorrhea and sore throat.   Eyes: Negative for redness.  Respiratory: Negative for cough.   Cardiovascular: Negative for chest pain.  Gastrointestinal: Negative for abdominal pain, diarrhea, nausea and vomiting.  Genitourinary: Negative for dysuria.  Musculoskeletal: Positive for arthralgias, joint swelling and myalgias.  Skin: Negative for rash.  Neurological: Negative for headaches.     Physical Exam Updated Vital Signs BP (!) 149/74 (BP Location: Right Arm)   Pulse 99   Temp 98.4 F (36.9 C) (Oral)   Resp 20   SpO2 100%   Physical Exam  Constitutional: She appears well-developed and well-nourished.  HENT:    Head: Normocephalic and atraumatic.  Eyes: Conjunctivae are normal. Right eye exhibits no discharge. Left eye exhibits no discharge.  Neck: Normal range of motion. Neck supple.  Cardiovascular: Normal rate, regular rhythm and normal heart sounds.  Pulmonary/Chest: Effort normal and breath sounds normal.  Abdominal: Soft. There is no tenderness.  Musculoskeletal:       Right shoulder: Normal.       Left shoulder: Normal.       Right elbow: Normal.      Left elbow: Normal.       Right wrist: She exhibits tenderness. She exhibits no bony tenderness.       Left wrist: Normal.       Right hip: Normal.       Left hip: She exhibits tenderness. She exhibits normal  strength.       Right knee: She exhibits decreased range of motion.       Left knee: She exhibits decreased range of motion.       Right ankle: Normal.       Left ankle: She exhibits decreased range of motion.       Cervical back: Normal.       Left upper leg: She exhibits tenderness. She exhibits no bony tenderness and no swelling.       Left lower leg: She exhibits tenderness and swelling.  Neurological: She is alert.  Skin: Skin is warm and dry.  Psychiatric: She has a normal mood and affect.  Nursing note and vitals reviewed. 2+ L DP and 2+ R DP pulses.    ED Treatments / Results  Labs (all labs ordered are listed, but only abnormal results are displayed) Labs Reviewed  CBC - Abnormal; Notable for the following components:      Result Value   RBC 3.28 (*)    Hemoglobin 10.7 (*)    HCT 33.5 (*)    MCV 102.1 (*)    All other components within normal limits  BASIC METABOLIC PANEL - Abnormal; Notable for the following components:   Glucose, Bld 110 (*)    Creatinine, Ser 1.16 (*)    GFR calc non Af Amer 49 (*)    GFR calc Af Amer 56 (*)    All other components within normal limits    EKG None  Radiology Dg Tibia/fibula Left  Result Date: 04/27/2018 CLINICAL DATA:  Fall down steps, left leg deformity EXAM: LEFT  TIBIA AND FIBULA - 2 VIEW COMPARISON:  None. FINDINGS: Comminuted proximal tibial shaft fracture, mildly displaced with apex anterior angulation. Approximately 1/3 shaft with lateral displacement. Minimally displaced transverse fibular shaft fracture. IMPRESSION: Proximal tibial and fibular fractures, mildly displaced, as above. Electronically Signed   By: Julian Hy M.D.   On: 04/27/2018 19:51   Dg Ankle Complete Left  Result Date: 04/27/2018 CLINICAL DATA:  Fall down steps, left leg deformity EXAM: LEFT ANKLE COMPLETE - 3+ VIEW COMPARISON:  None. FINDINGS: No fracture or dislocation is seen. The ankle mortise is intact. The base of the fifth metatarsal is unremarkable. Visualized soft tissues are within normal limits. IMPRESSION: Negative. Electronically Signed   By: Julian Hy M.D.   On: 04/27/2018 19:46   Dg Femur Min 2 Views Left  Result Date: 04/27/2018 CLINICAL DATA:  Fall down steps, left leg deformity EXAM: LEFT FEMUR 2 VIEWS COMPARISON:  MRI left hip dated 12/03/2017 FINDINGS: No fracture or dislocation is seen in the femur. Old deformity in the left parasymphyseal region. Left hip joint space is preserved. Pertinent findings involving the proximal tibia are reported separately. IMPRESSION: No fracture or dislocation is seen in the femur. Pertinent findings involving the proximal tibia are reported separately. Electronically Signed   By: Julian Hy M.D.   On: 04/27/2018 19:48    Procedures Procedures (including critical care time)  Medications Ordered in ED Medications  HYDROmorphone (DILAUDID) injection 0.5 mg (0.5 mg Intravenous Given 04/27/18 1833)  ondansetron (ZOFRAN) injection 4 mg (4 mg Intravenous Given 04/27/18 1834)  HYDROmorphone (DILAUDID) injection 1 mg (1 mg Intravenous Given 04/27/18 1958)     Initial Impression / Assessment and Plan / ED Course  I have reviewed the triage vital signs and the nursing notes.  Pertinent labs & imaging results that were  available during my care of the patient were reviewed by  me and considered in my medical decision making (see chart for details).  Clinical Course as of Apr 28 2015  Thu Jun 06, 267  [id number] 67 year old female with fall complaining of severe left knee leg pain.  She is markedly swollen and by x-rays of complex proximal tib-fib fracture.  Orthopedics has been consulted   [MB]    Clinical Course User Index [MB] Hayden Rasmussen, MD    Patient seen and examined. Work-up initiated. Medications ordered.   Vital signs reviewed and are as follows: BP (!) 149/74 (BP Location: Right Arm)   Pulse 99   Temp 98.4 F (36.9 C) (Oral)   Resp 20   SpO2 100%   6:19 PM Patient has calf swelling, intact pedal pulse and sensation at time of initial exam. Will continue to monitor for signs of compartment syndrome. D/w Dr. Melina Copa.   7:52 PM X-ray reviewed. Pt rechecked. Pulse still present.  8:16 PM Spoke with Dr. Doreatha Martin through [facility]. He will review films after his procedure and callback. I was asked to have fiberglass splint placed. I have spoken with ortho tech who will see.    9:23 PM Dr. Doreatha Martin has seen. He will admit. Patient may eat/drink prior to midnight.   Final Clinical Impressions(s) / ED Diagnoses   Final diagnoses:  Fall  Other closed fracture of proximal end of left tibia, initial encounter  Other closed fracture of proximal end of left fibula, initial encounter   Admit to ortho.    ED Discharge Orders    None       Carlisle Cater, Hershal Coria 04/27/18 2125    Hayden Rasmussen, MD 04/29/18 [phone]

## 2018-04-27 NOTE — ED Notes (Signed)
Extremity elevated on pillows

## 2018-04-27 NOTE — Progress Notes (Signed)
Orthopedic Tech Progress Note Patient Details:  [name] 17-Jan-1951 [id number]  Ortho Devices Type of Ortho Device: Ace wrap, Post (long leg) splint Ortho Device/Splint Location: LLE Ortho Device/Splint Interventions: Ordered, Application   Post Interventions Patient Tolerated: Well Instructions Provided: Care of device   Braulio Bosch 04/27/2018, 8:34 PM

## 2018-04-27 NOTE — H&P (Signed)
Orthopaedic Trauma Service (OTS) Consult   Patient ID: MEILANI EDMUNDSON MRN: [id number] DOB/AGE: Aug 28, 1951 67 y.o.  Reason for Consult:Left tibia fracture Referring Physician: Aletta Edouard, MD [facility]  HPI: [name] is an 67 y.o. female who is being seen in consultation at the request of Dr. Melina Copa for evaluation of left tibial shaft fracture.  The patient was walking down a status stairs when she tripped and fell and had immediate pain and inability to bear weight on her left leg.  She was brought to the emergency room where x-rays showed a proximal tibial shaft fracture with significant displacement.  Orthopedics was consulted for evaluation and treatment.  She was placed in a long-leg splint.  At baseline the patient has a history of chronic pain.  At this is due to back problems as well as pelvic ring injury.  She is seeing a provider in the past for this.  She states that her pain is significant.  It hurts with any motion.  Feels that the bones are moving.  At baseline she ambulates with the use of a cane.  Her functional status has declined since her pelvic problems.  She is with her daughter and granddaughter.  She lives with them at home.  She has a Psychologist, sport and exercise.  Past Medical History:  Diagnosis Date  . Arthritis    HANDS  . Borderline hypertension    ON  MEDS WENT OFF 2016 DUE TO DID NOT LIKE MEDS  . Cancer ([place]) 2017   ENDO METRIAL CANCER  . Chronic low back pain   . Degenerative spinal arthritis   . Depression   . Endometrial polyp   . Family history of adverse reaction to anesthesia    SISTER WITH SEVERE PONV  . Family history of bone cancer   . Family history of leukemia   . History of radiation therapy 09/23/16-10/28/16   pelvis 45 Gy in 25 fractions  . History of radiation therapy 12/11/7-10/2016   vaginal brachytherapy to vaginal cuff 18 Gy in 3 fractions  . Numbness in left leg    DUE TO BACK INJURY 2007  . Osteoporosis   . PMB (postmenopausal  bleeding)   . Pneumonia 2015  . Wears glasses     Past Surgical History:  Procedure Laterality Date  . HYSTEROSCOPY W/D&C N/A 07/06/2016   Procedure: DILATATION AND CURETTAGE /HYSTEROSCOPY;  Surgeon: Molli Posey, MD;  Location: [facility];  Service: Gynecology;  Laterality: N/A;  . INCISION AND DRAINAGE BREAST ABSCESS Left 1977  . IR GENERIC HISTORICAL  10/05/2016   IR FLUORO GUIDE CV LINE RIGHT 10/05/2016 Marybelle Killings, MD [facility]  . IR GENERIC HISTORICAL  10/05/2016   IR US GUIDE VASC ACCESS RIGHT 10/05/2016 Marybelle Killings, MD [facility]  . LUMBAR FUSION  2007   L1 -- L3  . MULTIPLE EXTRACTIONS WITH ALVEOLOPLASTY N/A 09/08/2016   Procedure: Extraction of tooth #'s 4,5,13,14,15,17,18,19,29 and 31 with alveoloplasty and gross debridement of remaining teeth;  Surgeon: Lenn Cal, DDS;  Location: [facility];  Service: Oral Surgery;  Laterality: N/A;  . ROBOTIC ASSISTED TOTAL HYSTERECTOMY WITH BILATERAL SALPINGO OOPHERECTOMY Bilateral 08/05/2016   Procedure: XI ROBOTIC ASSISTED TOTAL HYSTERECTOMY WITH BILATERAL SALPINGO OOPHORECTOMY SENTINAL LYMPH NODE BIOPSY;  Surgeon: Everitt Amber, MD;  Location: [facility];  Service: Gynecology;  Laterality: Bilateral;    Family History  Problem Relation Age of Onset  . Cancer Mother 37       leukemia  . Heart  disease Father 58  . Leukemia Sister        hairy cell leukemia  . Cancer Son 25       osteosarcoma  . Heart attack Brother     Social History:  reports that she has never smoked. She has never used smokeless tobacco. She reports that she does not drink alcohol or use drugs.  Allergies: No Known Allergies  Medications:  No current facility-administered medications on file prior to encounter.    Current Outpatient Medications on File Prior to Encounter  Medication Sig Dispense Refill  . cholecalciferol (VITAMIN D) 1000 units tablet Take 2,000 Units by mouth daily.    Marland Kitchen docusate sodium (COLACE) 100 MG capsule Take  100 mg by mouth 2 (two) times daily.    Marland Kitchen gabapentin (NEURONTIN) 300 MG capsule TAKE 2 CAPSULES BY MOUTH IN THE MORNING, AND TAKE 3 CAPSULES BY MOUTH IN THE EVENING 450 capsule 0  . ibuprofen (ADVIL,MOTRIN) 800 MG tablet Take 800 mg by mouth 3 (three) times daily.  0  . oxyCODONE-acetaminophen (PERCOCET) 7.5-325 MG tablet Take 2 tablets by mouth every 8 (eight) hours as needed for severe pain.     . polyethylene glycol (MIRALAX / GLYCOLAX) packet Take 17 g by mouth daily as needed for moderate constipation.    . nabumetone (RELAFEN) 500 MG tablet Take 1 tablet (500 mg total) by mouth 2 (two) times daily as needed. (Patient not taking: Reported on 04/27/2018) 60 tablet 0    ROS: Constitutional: No fever or chills Vision: No changes in vision ENT: No difficulty swallowing CV: No chest pain Pulm: No SOB or wheezing GI: No nausea or vomiting GU: No urgency or inability to hold urine Skin: No poor wound healing Neurologic: No numbness or tingling Psychiatric: No depression or anxiety Heme: No bruising Allergic: No reaction to medications or food   Exam: Blood pressure 133/90, pulse 99, temperature 98.4 F (36.9 C), temperature source Oral, resp. rate 18, height 5\' 2"  (1.575 m), weight 62.6 kg (138 lb), SpO2 94 %. General: No acute distress Orientation: Awake alert and oriented x3 Mood and Affect: Cooperative and pleasant Gait: Unable to assess due to her fracture Coordination and balance: Within normal limits  Left lower extremity: Splint is in place is clean dry and intact.  Her compartments are soft and compressible.  She has active EHL and FHL.  Dorsum and plantar sensation is intact.  She is warm well-perfused foot.  No obvious deformity about the knee or thigh.  Unable to tolerate any range of motion of the hip due to pain at her tibia.  Reflexes were not able to be assessed due to the splint that was in place.  Right lower extremity: Skin without lesions. No tenderness to palpation.  Full painless ROM, full strength in each muscle groups without evidence of instability.   Medical Decision Making: Imaging: X-rays of the left tibia show a proximal tibial shaft fracture with notable angulation and displacement.  Does not appear to extend into the articular surface of the tibial plateau.  Labs:  CBC    Component Value Date/Time   WBC 9.0 04/27/2018 1828   RBC 3.28 (L) 04/27/2018 1828   HGB 10.7 (L) 04/27/2018 1828   HGB 12.9 07/07/2017 1407   HCT 33.5 (L) 04/27/2018 1828   HCT 37.7 07/07/2017 1407   PLT 175 04/27/2018 1828   PLT 146 07/07/2017 1407   MCV 102.1 (H) 04/27/2018 1828   MCV 99.5 07/07/2017 1407   MCH  32.6 04/27/2018 1828   MCHC 31.9 04/27/2018 1828   RDW 11.9 04/27/2018 1828   RDW 12.0 07/07/2017 1407   LYMPHSABS 1.6 07/07/2017 1407   MONOABS 0.4 07/07/2017 1407   EOSABS 0.1 07/07/2017 1407   BASOSABS 0.0 07/07/2017 1407    Medical history and chart was reviewed  Assessment/Plan: 67 year old female with a left proximal tibial shaft fracture  Reviewed the images with the patient and the treatment options.  I feel that nonoperative treatment is likely not a good option.  The amount of displacement and angulation along with the need for long-leg immobilization would provide difficulty in mobilization and functional status.  I feel that surgical fixation is most appropriate.  I discussed risks and benefits with the patient and the family. Risks discussed included bleeding requiring blood transfusion, bleeding causing a hematoma, infection, malunion, nonunion, damage to surrounding nerves and blood vessels, pain, hardware prominence or irritation, hardware failure, stiffness, post-traumatic arthritis, DVT/PE, compartment syndrome, and even death.  After reviewing all the treatment options they agreed to proceed with surgery.  Consent will be obtained.  The patient will likely be touchdown weightbearing for likely 4 to 6 weeks.  She will be admitted for  pain control and mobilization with physical therapy.  She will likely need a skilled nursing facility upon discharge.  Shona Needles, MD [facility] [phone] (phone)

## 2018-04-28 ENCOUNTER — Inpatient Hospital Stay (HOSPITAL_COMMUNITY): Payer: Medicare Other | Admitting: Certified Registered"

## 2018-04-28 ENCOUNTER — Encounter (HOSPITAL_COMMUNITY)
Admission: EM | Disposition: A | Discharge status: Skilled Nursing Facility | Payer: Self-pay | Source: Home / Self Care | Attending: Student

## 2018-04-28 ENCOUNTER — Encounter (HOSPITAL_COMMUNITY): Payer: Self-pay

## 2018-04-28 ENCOUNTER — Inpatient Hospital Stay (HOSPITAL_COMMUNITY): Payer: Medicare Other

## 2018-04-28 HISTORY — PX: ORIF TIBIA FRACTURE: SHX5416

## 2018-04-28 LAB — SURGICAL PCR SCREEN
MRSA, PCR: NEGATIVE
Staphylococcus aureus: NEGATIVE

## 2018-04-28 LAB — MRSA PCR SCREENING: MRSA BY PCR: NEGATIVE

## 2018-04-28 LAB — HIV ANTIBODY (ROUTINE TESTING W REFLEX): HIV Screen 4th Generation wRfx: NONREACTIVE

## 2018-04-28 SURGERY — OPEN REDUCTION INTERNAL FIXATION (ORIF) TIBIA FRACTURE
Anesthesia: Regional | Laterality: Left

## 2018-04-28 MED ORDER — FENTANYL CITRATE (PF) 100 MCG/2ML IJ SOLN
25.0000 ug | INTRAMUSCULAR | Status: DC | PRN
  Administered 2018-04-28 (×3): 50 ug via INTRAVENOUS

## 2018-04-28 MED ORDER — FENTANYL CITRATE (PF) 100 MCG/2ML IJ SOLN
INTRAMUSCULAR | Status: DC | PRN
  Administered 2018-04-28: 100 ug via INTRAVENOUS
  Administered 2018-04-28: 50 ug via INTRAVENOUS

## 2018-04-28 MED ORDER — MIDAZOLAM HCL 5 MG/5ML IJ SOLN
INTRAMUSCULAR | Status: DC | PRN
  Administered 2018-04-28: 2 mg via INTRAVENOUS

## 2018-04-28 MED ORDER — PHENYLEPHRINE 40 MCG/ML (10ML) SYRINGE FOR IV PUSH (FOR BLOOD PRESSURE SUPPORT)
PREFILLED_SYRINGE | INTRAVENOUS | Status: AC
  Filled 2018-04-28: qty 10

## 2018-04-28 MED ORDER — FENTANYL CITRATE (PF) 100 MCG/2ML IJ SOLN
INTRAMUSCULAR | Status: AC
  Administered 2018-04-28: 50 ug via INTRAVENOUS
  Filled 2018-04-28: qty 2

## 2018-04-28 MED ORDER — VANCOMYCIN HCL 1000 MG IV SOLR
INTRAVENOUS | Status: AC
  Filled 2018-04-28: qty 1000

## 2018-04-28 MED ORDER — POVIDONE-IODINE 10 % EX SWAB: 2.0000 "application " | Freq: Once | CUTANEOUS | Status: DC

## 2018-04-28 MED ORDER — ROPIVACAINE HCL 7.5 MG/ML IJ SOLN
INTRAMUSCULAR | Status: DC | PRN
  Administered 2018-04-28: 20 mL via PERINEURAL

## 2018-04-28 MED ORDER — LACTATED RINGERS IV SOLN
INTRAVENOUS | Status: DC
  Administered 2018-04-28 – 2018-04-30 (×3): via INTRAVENOUS

## 2018-04-28 MED ORDER — PROPOFOL 10 MG/ML IV BOLUS
INTRAVENOUS | Status: DC | PRN
  Administered 2018-04-28: 110 mg via INTRAVENOUS

## 2018-04-28 MED ORDER — PROPOFOL 10 MG/ML IV BOLUS
INTRAVENOUS | Status: AC
  Filled 2018-04-28: qty 20

## 2018-04-28 MED ORDER — OXYCODONE HCL 5 MG/5ML PO SOLN: 5.0000 mg | Freq: Once | ORAL | Status: DC | PRN

## 2018-04-28 MED ORDER — DOUBLE ANTIBIOTIC 500-10000 UNIT/GM EX OINT
TOPICAL_OINTMENT | CUTANEOUS | Status: AC
  Filled 2018-04-28: qty 1

## 2018-04-28 MED ORDER — CHLORHEXIDINE GLUCONATE 4 % EX LIQD: 60.0000 mL | Freq: Once | CUTANEOUS | Status: DC

## 2018-04-28 MED ORDER — SUGAMMADEX SODIUM 200 MG/2ML IV SOLN
INTRAVENOUS | Status: DC | PRN
  Administered 2018-04-28: 125 mg via INTRAVENOUS

## 2018-04-28 MED ORDER — OXYCODONE HCL 5 MG PO TABS: 5.0000 mg | ORAL_TABLET | Freq: Once | ORAL | Status: DC | PRN

## 2018-04-28 MED ORDER — CEFAZOLIN SODIUM-DEXTROSE 2-4 GM/100ML-% IV SOLN
2.0000 g | INTRAVENOUS | Status: AC
  Administered 2018-04-28: 2 g via INTRAVENOUS
  Filled 2018-04-28 (×2): qty 100

## 2018-04-28 MED ORDER — FENTANYL CITRATE (PF) 100 MCG/2ML IJ SOLN
INTRAMUSCULAR | Status: AC
  Filled 2018-04-28: qty 2

## 2018-04-28 MED ORDER — FENTANYL CITRATE (PF) 250 MCG/5ML IJ SOLN
INTRAMUSCULAR | Status: AC
  Filled 2018-04-28: qty 5

## 2018-04-28 MED ORDER — ROCURONIUM BROMIDE 10 MG/ML (PF) SYRINGE
PREFILLED_SYRINGE | INTRAVENOUS | Status: DC | PRN
  Administered 2018-04-28: 50 mg via INTRAVENOUS

## 2018-04-28 MED ORDER — VANCOMYCIN HCL 1000 MG IV SOLR
INTRAVENOUS | Status: DC | PRN
  Administered 2018-04-28: 1000 mg via TOPICAL

## 2018-04-28 MED ORDER — BACITRACIN ZINC 500 UNIT/GM EX OINT
TOPICAL_OINTMENT | CUTANEOUS | Status: DC | PRN
  Administered 2018-04-28: 1 via TOPICAL

## 2018-04-28 MED ORDER — DEXAMETHASONE SODIUM PHOSPHATE 10 MG/ML IJ SOLN
INTRAMUSCULAR | Status: DC | PRN
  Administered 2018-04-28: 10 mg via INTRAVENOUS

## 2018-04-28 MED ORDER — CEFAZOLIN SODIUM-DEXTROSE 2-4 GM/100ML-% IV SOLN
2.0000 g | Freq: Three times a day (TID) | INTRAVENOUS | Status: AC
  Administered 2018-04-28 – 2018-04-29 (×3): 2 g via INTRAVENOUS
  Filled 2018-04-28 (×3): qty 100

## 2018-04-28 MED ORDER — MIDAZOLAM HCL 2 MG/2ML IJ SOLN
INTRAMUSCULAR | Status: AC
  Filled 2018-04-28: qty 2

## 2018-04-28 MED ORDER — PHENYLEPHRINE 40 MCG/ML (10ML) SYRINGE FOR IV PUSH (FOR BLOOD PRESSURE SUPPORT)
PREFILLED_SYRINGE | INTRAVENOUS | Status: DC | PRN
  Administered 2018-04-28 (×2): 80 ug via INTRAVENOUS

## 2018-04-28 MED ORDER — ENOXAPARIN SODIUM 40 MG/0.4ML ~~LOC~~ SOLN
40.0000 mg | SUBCUTANEOUS | Status: DC
  Administered 2018-04-29 – 2018-05-03 (×5): 40 mg via SUBCUTANEOUS
  Filled 2018-04-28 (×5): qty 0.4

## 2018-04-28 MED ORDER — ONDANSETRON HCL 4 MG/2ML IJ SOLN
INTRAMUSCULAR | Status: DC | PRN
  Administered 2018-04-28: 4 mg via INTRAVENOUS

## 2018-04-28 MED ORDER — ARTIFICIAL TEARS OPHTHALMIC OINT
TOPICAL_OINTMENT | OPHTHALMIC | Status: DC | PRN
  Administered 2018-04-28: 1 via OPHTHALMIC

## 2018-04-28 MED ORDER — CEFAZOLIN SODIUM-DEXTROSE 2-4 GM/100ML-% IV SOLN
2.0000 g | INTRAVENOUS | Status: DC
  Filled 2018-04-28: qty 100

## 2018-04-28 MED ORDER — BUPIVACAINE-EPINEPHRINE (PF) 0.5% -1:200000 IJ SOLN
INTRAMUSCULAR | Status: DC | PRN
  Administered 2018-04-28: 25 mL via PERINEURAL

## 2018-04-28 MED ORDER — ARTIFICIAL TEARS OPHTHALMIC OINT: TOPICAL_OINTMENT | OPHTHALMIC | Status: DC | PRN

## 2018-04-28 MED ORDER — PHENYLEPHRINE HCL 10 MG/ML IJ SOLN
INTRAMUSCULAR | Status: DC | PRN
  Administered 2018-04-28: 50 ug/min via INTRAVENOUS

## 2018-04-28 MED ORDER — 0.9 % SODIUM CHLORIDE (POUR BTL) OPTIME
TOPICAL | Status: DC | PRN
  Administered 2018-04-28: 1000 mL

## 2018-04-28 MED ORDER — ROCURONIUM BROMIDE 10 MG/ML (PF) SYRINGE
PREFILLED_SYRINGE | INTRAVENOUS | Status: AC
  Filled 2018-04-28: qty 5

## 2018-04-28 SURGICAL SUPPLY — 71 items
BANDAGE ACE 4X5 VEL STRL LF (GAUZE/BANDAGES/DRESSINGS) ×3 IMPLANT
BANDAGE ACE 6X5 VEL STRL LF (GAUZE/BANDAGES/DRESSINGS) ×3 IMPLANT
BANDAGE ESMARK 6X9 LF (GAUZE/BANDAGES/DRESSINGS) ×1 IMPLANT
BIT DRILL 3.3 LONG (BIT) ×2 IMPLANT
BIT DRILL QC 3.3X195 (BIT) ×2 IMPLANT
BLADE CLIPPER SURG (BLADE) IMPLANT
BNDG CMPR 9X6 STRL LF SNTH (GAUZE/BANDAGES/DRESSINGS) ×1
BNDG COHESIVE 4X5 TAN STRL (GAUZE/BANDAGES/DRESSINGS) IMPLANT
BNDG ESMARK 6X9 LF (GAUZE/BANDAGES/DRESSINGS) ×3
BNDG GAUZE ELAST 4 BULKY (GAUZE/BANDAGES/DRESSINGS) ×3 IMPLANT
BRUSH SCRUB SURG 4.25 DISP (MISCELLANEOUS) ×6 IMPLANT
CAP LOCK NCB (Cap) ×14 IMPLANT
CHLORAPREP W/TINT 26ML (MISCELLANEOUS) ×3 IMPLANT
COVER MAYO STAND STRL (DRAPES) ×3 IMPLANT
DRAPE C-ARM 42X72 X-RAY (DRAPES) ×3 IMPLANT
DRAPE C-ARMOR (DRAPES) ×3 IMPLANT
DRAPE HALF SHEET 40X57 (DRAPES) ×6 IMPLANT
DRAPE INCISE IOBAN 66X45 STRL (DRAPES) ×3 IMPLANT
DRAPE U-SHAPE 47X51 STRL (DRAPES) ×3 IMPLANT
DRSG ADAPTIC 3X8 NADH LF (GAUZE/BANDAGES/DRESSINGS) ×3 IMPLANT
DRSG PAD ABDOMINAL 8X10 ST (GAUZE/BANDAGES/DRESSINGS) ×12 IMPLANT
ELECT REM PT RETURN 9FT ADLT (ELECTROSURGICAL) ×3
ELECTRODE REM PT RTRN 9FT ADLT (ELECTROSURGICAL) ×1 IMPLANT
GAUZE SPONGE 4X4 12PLY STRL (GAUZE/BANDAGES/DRESSINGS) ×3 IMPLANT
GLOVE BIO SURGEON STRL SZ7.5 (GLOVE) ×12 IMPLANT
GLOVE BIOGEL PI IND STRL 7.5 (GLOVE) ×1 IMPLANT
GLOVE BIOGEL PI INDICATOR 7.5 (GLOVE) ×2
GLOVE PROGUARD SZ 7 1/2 (GLOVE) ×3 IMPLANT
GOWN STRL REUS W/ TWL LRG LVL3 (GOWN DISPOSABLE) ×2 IMPLANT
GOWN STRL REUS W/TWL LRG LVL3 (GOWN DISPOSABLE) ×6
K-WIRE 2.0 (WIRE) ×6
K-WIRE FXSTD 280X2XNS SS (WIRE) ×2
KIT BASIN OR (CUSTOM PROCEDURE TRAY) ×3 IMPLANT
KIT TURNOVER KIT B (KITS) ×3 IMPLANT
KWIRE FXSTD 280X2XNS SS (WIRE) IMPLANT
MANIFOLD NEPTUNE II (INSTRUMENTS) ×3 IMPLANT
NS IRRIG 1000ML POUR BTL (IV SOLUTION) ×3 IMPLANT
PACK TOTAL JOINT (CUSTOM PROCEDURE TRAY) ×3 IMPLANT
PAD ABD 8X10 STRL (GAUZE/BANDAGES/DRESSINGS) ×2 IMPLANT
PAD ARMBOARD 7.5X6 YLW CONV (MISCELLANEOUS) ×6 IMPLANT
PAD CAST 4YDX4 CTTN HI CHSV (CAST SUPPLIES) ×1 IMPLANT
PADDING CAST COTTON 4X4 STRL (CAST SUPPLIES) ×3
PADDING CAST COTTON 6X4 STRL (CAST SUPPLIES) ×3 IMPLANT
PLATE LAT LOCK PA 192 9H LT (Plate) ×2 IMPLANT
SCREW NCB 4.0 28MM (Screw) ×2 IMPLANT
SCREW NCB 4.0MX30M (Screw) ×2 IMPLANT
SCREW NCB 4.0MX55M (Screw) ×2 IMPLANT
SCREW NCB 4.0X26MM (Screw) ×2 IMPLANT
SCREW NCB 4.0X75 CORT S/T (Screw) ×4 IMPLANT
SCREW NCB 4X3 4X70 (Screw) ×4 IMPLANT
SPONGE LAP 18X18 X RAY DECT (DISPOSABLE) IMPLANT
STAPLER VISISTAT 35W (STAPLE) ×3 IMPLANT
STOCKINETTE IMPERVIOUS LG (DRAPES) IMPLANT
SUCTION FRAZIER HANDLE 10FR (MISCELLANEOUS) ×2
SUCTION TUBE FRAZIER 10FR DISP (MISCELLANEOUS) ×1 IMPLANT
SUT ETHILON 3 0 PS 1 (SUTURE) IMPLANT
SUT MNCRL AB 3-0 PS2 18 (SUTURE) ×3 IMPLANT
SUT PROLENE 0 CT (SUTURE) IMPLANT
SUT VIC AB 0 CT1 27 (SUTURE) ×3
SUT VIC AB 0 CT1 27XBRD ANBCTR (SUTURE) ×1 IMPLANT
SUT VIC AB 1 CT1 27 (SUTURE) ×3
SUT VIC AB 1 CT1 27XBRD ANBCTR (SUTURE) ×1 IMPLANT
SUT VIC AB 2-0 CT1 27 (SUTURE) ×6
SUT VIC AB 2-0 CT1 TAPERPNT 27 (SUTURE) ×2 IMPLANT
TOWEL OR 17X24 6PK STRL BLUE (TOWEL DISPOSABLE) ×3 IMPLANT
TOWEL OR 17X26 10 PK STRL BLUE (TOWEL DISPOSABLE) ×6 IMPLANT
TRAY FOLEY MTR SLVR 16FR STAT (SET/KITS/TRAYS/PACK) IMPLANT
TUBE CONNECTING 12'X1/4 (SUCTIONS) ×1
TUBE CONNECTING 12X1/4 (SUCTIONS) ×2 IMPLANT
WATER STERILE IRR 1000ML POUR (IV SOLUTION) ×6 IMPLANT
YANKAUER SUCT BULB TIP NO VENT (SUCTIONS) ×3 IMPLANT

## 2018-04-28 NOTE — Anesthesia Procedure Notes (Deleted)
Performed by: Jrue Jarriel A, CRNA       

## 2018-04-28 NOTE — Anesthesia Preprocedure Evaluation (Addendum)
Anesthesia Evaluation  Patient identified by MRN, date of birth, ID band Patient awake    Reviewed: Allergy & Precautions, NPO status , Patient's Chart, lab work & pertinent test results  History of Anesthesia Complications Negative for: history of anesthetic complications  Airway Mallampati: II  TM Distance: >3 FB Neck ROM: Full  Mouth opening: Limited Mouth Opening  Dental  (+) Poor Dentition, Missing, Dental Advisory Given, Loose   Pulmonary neg pulmonary ROS,    breath sounds clear to auscultation       Cardiovascular negative cardio ROS   Rhythm:Regular Rate:Tachycardia     Neuro/Psych PSYCHIATRIC DISORDERS Depression  Neuromuscular disease    GI/Hepatic negative GI ROS, Neg liver ROS,   Endo/Other  negative endocrine ROS  Renal/GU negative Renal ROS     Musculoskeletal  (+) Arthritis , Left tibia fx   Abdominal   Peds  Hematology  (+) anemia ,   Anesthesia Other Findings   Reproductive/Obstetrics                            Anesthesia Physical Anesthesia Plan  ASA: II  Anesthesia Plan: General and Regional   Post-op Pain Management:  Regional for Post-op pain   Induction: Intravenous  PONV Risk Score and Plan: 3 and Ondansetron and Dexamethasone  Airway Management Planned: Oral ETT and LMA  Additional Equipment: None  Intra-op Plan:   Post-operative Plan: Extubation in OR  Informed Consent: I have reviewed the patients History and Physical, chart, labs and discussed the procedure including the risks, benefits and alternatives for the proposed anesthesia with the patient or authorized representative who has indicated his/her understanding and acceptance.   Dental advisory given  Plan Discussed with: CRNA and Surgeon  Anesthesia Plan Comments:         Anesthesia Quick Evaluation

## 2018-04-28 NOTE — Anesthesia Procedure Notes (Signed)
Procedure Name: Intubation Date/Time: 04/28/2018 3:26 PM Performed by: Josephine Igo, CRNA Pre-anesthesia Checklist: Patient identified, Emergency Drugs available, Suction available and Patient being monitored Patient Re-evaluated:Patient Re-evaluated prior to induction Oxygen Delivery Method: Circle system utilized Preoxygenation: Pre-oxygenation with 100% oxygen Induction Type: IV induction Ventilation: Mask ventilation without difficulty Laryngoscope Size: Miller and 2 Grade View: Grade I Tube type: Oral Tube size: 7.0 mm Number of attempts: 1 Airway Equipment and Method: Stylet Placement Confirmation: ETT inserted through vocal cords under direct vision,  positive ETCO2 and breath sounds checked- equal and bilateral Secured at: 22 cm Tube secured with: Tape Dental Injury: Teeth and Oropharynx as per pre-operative assessment

## 2018-04-28 NOTE — Anesthesia Procedure Notes (Signed)
Anesthesia Regional Block: Adductor canal block   Pre-Anesthetic Checklist: ,, timeout performed, Correct Patient, Correct Site, Correct Laterality, Correct Procedure, Correct Position, site marked, Risks and benefits discussed,  Surgical consent,  Pre-op evaluation,  At surgeon's request and post-op pain management  Laterality: Left  Prep: chloraprep       Needles:  Injection technique: Single-shot  Needle Type: Stimiplex     Needle Length: 9cm  Needle Gauge: 21     Additional Needles:   Procedures:,,,, ultrasound used (permanent image in chart),,,,  Narrative:  Start time: 04/28/2018 5:28 PM End time: 04/28/2018 5:30 PM Injection made incrementally with aspirations every 5 mL.  Performed by: Personally  Anesthesiologist: Nolon Nations, MD  Additional Notes: BP cuff, EKG monitors applied. Sedation begun. Artery and nerve location verified with U/S and anesthetic injected incrementally, slowly, and after negative aspirations under direct u/s guidance. Good fascial /perineural spread. Tolerated well.

## 2018-04-28 NOTE — Interval H&P Note (Signed)
History and Physical Interval Note:  04/28/2018 3:14 PM  [name]  has presented today for surgery, with the diagnosis of Left tibia fracture  The various methods of treatment have been discussed with the patient and family. After consideration of risks, benefits and other options for treatment, the patient has consented to  Procedure(s): OPEN REDUCTION INTERNAL FIXATION (ORIF) TIBIA FRACTURE (Left) as a surgical intervention .  The patient's history has been reviewed, patient examined, no change in status, stable for surgery.  I have reviewed the patient's chart and labs.  Questions were answered to the patient's satisfaction.     Lennette Bihari P Noela Brothers

## 2018-04-28 NOTE — Anesthesia Procedure Notes (Signed)
Anesthesia Regional Block: Popliteal block   Pre-Anesthetic Checklist: ,, timeout performed, Correct Patient, Correct Site, Correct Laterality, Correct Procedure, Correct Position, site marked, Risks and benefits discussed,  Surgical consent,  Pre-op evaluation,  At surgeon's request and post-op pain management  Laterality: Lower and Left  Prep: chloraprep       Needles:  Injection technique: Single-shot  Needle Type: Echogenic Needle          Additional Needles:   Procedures:,,,, ultrasound used (permanent image in chart),,,,  Narrative:  Start time: 04/28/2018 2:41 PM End time: 04/28/2018 2:46 PM Injection made incrementally with aspirations every 5 mL.  Performed by: Personally   Additional Notes: H+P and labs reviewed, risks and benefits discussed with patient, procedure tolerated well without complications

## 2018-04-28 NOTE — Transfer of Care (Signed)
Immediate Anesthesia Transfer of Care Note  Patient: [name]  Procedure(s) Performed: OPEN REDUCTION INTERNAL FIXATION (ORIF) TIBIA FRACTURE (Left )  Patient Location: PACU  Anesthesia Type:General  Level of Consciousness: sedated  Airway & Oxygen Therapy: Patient Spontanous Breathing and Patient connected to nasal cannula oxygen  Post-op Assessment: Report given to RN and Post -op Vital signs reviewed and stable  Post vital signs: Reviewed and stable  Last Vitals:  Vitals Value Taken Time  BP 135/73 04/28/2018  4:56 PM  Temp 36.4 C 04/28/2018  4:56 PM  Pulse 102 04/28/2018  4:58 PM  Resp 12 04/28/2018  4:58 PM  SpO2 100 % 04/28/2018  4:58 PM  Vitals shown include unvalidated device data.  Last Pain:  Vitals:   04/28/18 1656  TempSrc:   PainSc: (P) 0-No pain      Patients Stated Pain Goal: 3 (69/45/03 8882)  Complications: No apparent anesthesia complications

## 2018-04-28 NOTE — Op Note (Signed)
[facility] [phone]) Date of Surgery: 04/28/2018  Admit Date: 04/27/2018   Diagnoses: Pre-Op Diagnoses: Left tibial shaft fracture   Post-Op Diagnosis: Same  Procedures: CPT 27758-Open reduction internal fixation of left tibia fracture  Surgeons: Primary: Shona Needles, MD   Location:[facility] OR ROOM 03   AnesthesiaGeneral   Antibiotics:Ancef 2g preop   Tourniquettime:None used  [id number]:68 mL  Complications:None  Specimens:None  Implants: Implant Name Type Inv. Item Serial No. Manufacturer Lot No. LRB No. Used Action  PLATE LAT LOCK PA 115 9H LT - [id number] Plate PLATE LAT LOCK PA 741 9H LT  [employer](ORTH,TRAU,BIO,SG)  Left 1 Implanted  SCREW NCB 4.0X75 CORT S/T - [id number] Screw SCREW NCB 4.0X75 CORT S/T  [employer](ORTH,TRAU,BIO,SG)  Left 2 Implanted  SCREW NCB 4X3 4X70 - [id number] Screw SCREW NCB 4X3 4X70  [employer](ORTH,TRAU,BIO,SG)  Left 2 Implanted  SCREW NCB 4.0MX30M - [id number] Screw SCREW NCB 4.0MX30M  [employer](ORTH,TRAU,BIO,SG)  Left 1 Implanted  SCREW NCB 4.0 28MM - [id number] Screw SCREW NCB 4.0 28MM  [employer](ORTH,TRAU,BIO,SG)  Left 1 Implanted  SCREW NCB 4.0X26MM - [id number] Screw SCREW NCB 4.0X26MM  [employer](ORTH,TRAU,BIO,SG)  Left 1 Implanted  CAP LOCK NCB - [id number] Cap CAP LOCK NCB  [employer](ORTH,TRAU,BIO,SG)  Left 7 Implanted  SCREW NCB 4.6PV94I - [id number] Screw SCREW NCB 4.2LM78M  [employer](ORTH,TRAU,BIO,SG)  Left 1 Implanted    IndicationsforSurgery: 67 year old female with a left proximal tibial shaft fracture I feel that nonoperative treatment is likely not a good option.  The amount of displacement and angulation along with the need for long-leg immobilization would provide difficulty in mobilization and functional status.  I feel that surgical fixation is most appropriate.  I discussed risks and benefits with the patient and the family. Risks discussed included bleeding  requiring blood transfusion, bleeding causing a hematoma, infection, malunion, nonunion, damage to surrounding nerves and blood vessels, pain, hardware prominence or irritation, hardware failure, stiffness, post-traumatic arthritis, DVT/PE, compartment syndrome, and even death.  After reviewing all the treatment options they agreed to proceed with surgery.  Consent will be obtained.  Operative Findings: Limited open reduction internal fixation using [employer] proximal tibial NCB plate 9 hole  Procedure: The patient was identified in the preoperative holding area. Consent was confirmed with the patient and their family and all questions were answered. The operative extremity was marked after confirmation with the patient. The patient was then brought back to the operating room by our anesthesia colleagues.  They were carefully transferred over to a radiolucent flat top table.  They were placed under general anesthetic.  A bump was placed in the operative hip.  A bone foam was placed under the leg.  The operative extremity was then prepped and draped in usual sterile fashion. A preoperative timeout was performed to verify the patient, the procedure, and the extremity. Preoperative antibiotics were dosed.  Fluoroscopic images were obtained to show the instability of the fracture.  I then attempted to aligned the fracture appropriately using closed means.  I made a limited incision over Gerdys and carried this down through skin and subcutaneous tissue.  I incised over the IT band tubercle I released this subperiosteally to expose the proximal tibia.  I used a Cobb elevator to release the muscle along the anterior lateral aspect of the tibia.  I then used fluoroscopy to choose an appropriate length plate.  I chose a 9 hole plate to use.  I then slid this sub-muscular along the length of the tibia  using my jig as a guide.  There was still displacement of the proximal segment and I made a small percutaneous  incision along the medial aspect of the proximal tibia.  I used a reduction tenaculum to reduce this portion of the proximal shaft to the plate.  Alignment was appropriate and was held provisionally with K wires.  Once the plate was confirmed and reduction was confirmed I then placed 4.0 millimeter screws in the proximal segment of the tibia.  I then used the jig to place percutaneous 4.0 millimeter screws through the plate.  Locking caps were placed at the proximal to shaft screws the last the distal screw was left without a locking cap to prevent a significant stress riser.  I then removed the jig and placed locking caps on the proximal screws.  Another screw in the metaphysis was placed.  The locking cap was placed this as well.  Final fluoroscopic images were obtained.  The wound was irrigated.  The IT band was closed over the plate using #1 Vicryl suture.  The skin was closed with 2-0 Vicryl and 3-0 nylon.  Sterile dressing consisting of bacitracin ointment, Adaptic, 4 x 4's and sterile cast padding was placed.  The patient was then awoken from anesthesia and taken to PACU in stable condition.  Post Op Plan/Instructions: Patient will be touchdown weightbearing to the left lower extremity.  I will allow unrestricted knee range of motion.  She received postoperative Ancef for surgical prophylaxis.  She will receive Lovenox for DVT prophylaxis.  She will mobilize with physical and occupational therapy.  I was present and performed the entire surgery.  Katha Hamming, MD [facility]

## 2018-04-29 ENCOUNTER — Inpatient Hospital Stay (HOSPITAL_COMMUNITY): Payer: Medicare Other

## 2018-04-29 LAB — CBC
HEMATOCRIT: 26.5 % — AB (ref 36.0–46.0)
Hemoglobin: 8.9 g/dL — ABNORMAL LOW (ref 12.0–15.0)
MCH: 33.1 pg (ref 26.0–34.0)
MCHC: 33.6 g/dL (ref 30.0–36.0)
MCV: 98.5 fL (ref 78.0–100.0)
Platelets: 163 10*3/uL (ref 150–400)
RBC: 2.69 MIL/uL — AB (ref 3.87–5.11)
RDW: 11.8 % (ref 11.5–15.5)
WBC: 8.3 10*3/uL (ref 4.0–10.5)

## 2018-04-29 LAB — BASIC METABOLIC PANEL
Anion gap: 9 (ref 5–15)
BUN: 10 mg/dL (ref 6–20)
CO2: 26 mmol/L (ref 22–32)
CREATININE: 0.95 mg/dL (ref 0.44–1.00)
Calcium: 8.7 mg/dL — ABNORMAL LOW (ref 8.9–10.3)
Chloride: 102 mmol/L (ref 101–111)
GFR calc Af Amer: >60 mL/min (ref >60)
Glucose, Bld: 131 mg/dL — ABNORMAL HIGH (ref 65–99)
POTASSIUM: 4.2 mmol/L (ref 3.5–5.1)
SODIUM: 137 mmol/L (ref 135–145)

## 2018-04-29 NOTE — Anesthesia Postprocedure Evaluation (Signed)
Anesthesia Post Note  Patient: [name]  Procedure(s) Performed: OPEN REDUCTION INTERNAL FIXATION (ORIF) TIBIA FRACTURE (Left )     Patient location during evaluation: PACU Anesthesia Type: Regional and General Level of consciousness: awake and alert Pain management: pain level controlled Vital Signs Assessment: post-procedure vital signs reviewed and stable Respiratory status: spontaneous breathing, nonlabored ventilation, respiratory function stable and patient connected to nasal cannula oxygen Cardiovascular status: blood pressure returned to baseline and stable Postop Assessment: no apparent nausea or vomiting Anesthetic complications: no    Last Vitals:  Vitals:   04/28/18 2350 04/29/18 0356  BP: 116/83 (!) 112/95  Pulse: 97 87  Resp: 15 16  Temp: 36.6 C 36.8 C  SpO2: 96% 96%    Last Pain:  Vitals:   04/29/18 0554  TempSrc:   PainSc: Asleep                 Alfredia Desanctis

## 2018-04-29 NOTE — Progress Notes (Signed)
Pt transfer from chair to bed with assistance and bearing weight to RLE. Upon reaching bed, pt with new onset swelling, pain and bruising to right ankle without trauma to site. MD made aware via phone and given information to oncoming RN. Requested x-ray per SBAR report.

## 2018-04-29 NOTE — Progress Notes (Signed)
Orthopaedic Trauma Progress Note  S: Doing well, pain controlled. Block still in place. No issues overnight  O:  Vitals:   04/28/18 2350 04/29/18 0356  BP: 116/83 (!) 112/95  Pulse: 97 87  Resp: 15 16  Temp: 97.8 F (36.6 C) 98.3 F (36.8 C)  SpO2: 96% 96%   General: No acute distress awake alert and oriented x3 Left lower extremity: Dressing in place clean dry and intact.  Compartments are soft and compressible.  No active motion of the toes due to block still in being in place the sensation is diminished in her foot  Imaging: Stable postoperative images  Labs:  CBC    Component Value Date/Time   WBC 8.3 04/29/2018 0325   RBC 2.69 (L) 04/29/2018 0325   HGB 8.9 (L) 04/29/2018 0325   HGB 12.9 07/07/2017 1407   HCT 26.5 (L) 04/29/2018 0325   HCT 37.7 07/07/2017 1407   PLT 163 04/29/2018 0325   PLT 146 07/07/2017 1407   MCV 98.5 04/29/2018 0325   MCV 99.5 07/07/2017 1407   MCH 33.1 04/29/2018 0325   MCHC 33.6 04/29/2018 0325   RDW 11.8 04/29/2018 0325   RDW 12.0 07/07/2017 1407   LYMPHSABS 1.6 07/07/2017 1407   MONOABS 0.4 07/07/2017 1407   EOSABS 0.1 07/07/2017 1407   BASOSABS 0.0 07/07/2017 1407      A/P: 67 year old female status post open reduction internal fixation of left tibial fracture  Weightbearing: TDWB LLE Insicional and dressing care: Dressings left intact until follow-up Orthopedic device(s): None Showering: Not yet VTE prophylaxis: Lovenox 40mg  qd 30 days Pain control: Percocet and dilaudid Follow - up plan: 2 weeks   Shona Needles, MD [facility] [phone] (phone)

## 2018-04-29 NOTE — Evaluation (Signed)
Occupational Therapy Evaluation Patient Details Name: [name] MRN: [id number] DOB: 02/26/1951 Today's Date: 04/29/2018    History of Present Illness Pt is a 67 y/o female s/p L ORIF tib fx she sustained from falling down her stairs at home. PMH including but not limited to endometrial cancer and depression.   Clinical Impression   PTA, pt was living with her daughter and was performing BADLs and using RW for functional mobility. Pt currently requiring Min Guard A for UB ADLs, Min A for LB ADLs, and Min A +2 for functional transfers with RW. Pt requiring cues throughout for adherence to NWB status. Pt highly motivated to return home but agreeable to think about post-acute rehab pending her progress. Pt would benefit from further acute OT to facilitate safe dc. Recommend dc to SNF for further OT to optimize safety, independence with ADLs, and return to PLOF.      Follow Up Recommendations  SNF    Equipment Recommendations  3 in 1 bedside commode    Recommendations for Other Services PT consult     Precautions / Restrictions Precautions Precautions: Fall Restrictions Weight Bearing Restrictions: Yes LLE Weight Bearing: Non weight bearing      Mobility Bed Mobility Overal bed mobility: Needs Assistance Bed Mobility: Supine to Sit     Supine to sit: Min guard     General bed mobility comments: increased time and effort, min guard for safety  Transfers Overall transfer level: Needs assistance Equipment used: Rolling walker (2 wheeled) Transfers: Sit to/from [facility] Sit to Stand: Min assist;+2 safety/equipment;+2 physical assistance Stand pivot transfers: Min assist;+2 safety/equipment;+2 physical assistance       General transfer comment: increased time and effort, bed in slightly elevated position, cueing for safe hand placement, assist for stability with transitional movements. Pt required frequent cueing to maintain NWB L LE throughout  (limited secondary to pain and weakness)    Balance Overall balance assessment: Needs assistance Sitting-balance support: No upper extremity supported Sitting balance-Leahy Scale: Good     Standing balance support: During functional activity;Bilateral upper extremity supported Standing balance-Leahy Scale: Poor                             ADL either performed or assessed with clinical judgement   ADL Overall ADL's : Needs assistance/impaired Eating/Feeding: Set up;Sitting   Grooming: Set up;Sitting   Upper Body Bathing: Min guard;Sitting   Lower Body Bathing: Minimal assistance;Sit to/from stand   Upper Body Dressing : Min guard;Sitting   Lower Body Dressing: Minimal assistance;Sitting/lateral leans Lower Body Dressing Details (indicate cue type and reason): Pt able to bend forward to don right sock while sitting at EOB. Pt requiring Min A for standing balance Toilet Transfer: Minimal assistance;Stand-pivot;RW;+2 for safety/equipment(Simulated to recliner) Toilet Transfer Details (indicate cue type and reason): Pt performing stand pivot transfer with Min A for balance. Pt requiring cues to maintain NWB status. Once pt understood WB status, maintaining throughout transfer.          Functional mobility during ADLs: Minimal assistance;+2 for safety/equipment;Rolling walker(stand pivot only) General ADL Comments: Pt highly motivated to return home     Vision Baseline Vision/History: Wears glasses Patient Visual Report: No change from baseline       Perception     Praxis      Pertinent Vitals/Pain Pain Assessment: 0-10 Pain Score: 10-Worst pain ever Pain Location: L LE Pain Descriptors / Indicators: Grimacing;Guarding Pain Intervention(s):  Monitored during session;Limited activity within patient's tolerance;Repositioned     Hand Dominance     Extremity/Trunk Assessment Upper Extremity Assessment Upper Extremity Assessment: Overall WFL for tasks  assessed   Lower Extremity Assessment Lower Extremity Assessment: Defer to PT evaluation LLE Deficits / Details: Pt with decreased strength and ROM limitations secondary to post-op pain. Pt able to minimally flex and extend all toes. Pt with decreased sensation to light touch throughout. LLE: Unable to fully assess due to pain   Cervical / Trunk Assessment Cervical / Trunk Assessment: Normal   Communication Communication Communication: No difficulties   Cognition Arousal/Alertness: Awake/alert Behavior During Therapy: WFL for tasks assessed/performed Overall Cognitive Status: Within Functional Limits for tasks assessed                                     General Comments       Exercises     Shoulder Instructions      Home Living Family/patient expects to be discharged to:: Private residence Living Arrangements: Children;Other relatives Available Help at Discharge: Family;Available 24 hours/day Type of Home: House Home Access: Stairs to enter [employer] of Steps: 4 Entrance Stairs-Rails: Right Home Layout: One level     Bathroom Shower/Tub: Teacher, early years/pre: Standard     Home Equipment: Environmental consultant - 2 wheels;Cane - single point          Prior Functioning/Environment Level of Independence: Independent with assistive device(s)        Comments: pt ambulates with use of SPC        OT Problem List: Decreased strength;Decreased range of motion;Decreased activity tolerance;Impaired balance (sitting and/or standing);Decreased safety awareness;Decreased knowledge of use of DME or AE;Decreased knowledge of precautions;Pain      OT Treatment/Interventions: Self-care/ADL training;Therapeutic exercise;Energy conservation;DME and/or AE instruction;Therapeutic activities;Patient/family education    OT Goals(Current goals can be found in the care plan section) Acute Rehab OT Goals Patient Stated Goal: return home OT Goal  Formulation: With patient Time For Goal Achievement: 05/13/18 Potential to Achieve Goals: Good ADL Goals Pt Will Perform Lower Body Dressing: with set-up;with supervision;sit to/from stand;with adaptive equipment Pt Will Transfer to Toilet: with set-up;with supervision;ambulating;bedside commode Pt Will Perform Toileting - Clothing Manipulation and hygiene: with set-up;with supervision;sit to/from stand;sitting/lateral leans Additional ADL Goal #1: Pt will perform bed mobility with supervision in preparation for ADLs  OT Frequency: Min 2X/week   Barriers to D/C:            Co-evaluation PT/OT/SLP Co-Evaluation/Treatment: Yes Reason for Co-Treatment: For patient/therapist safety;To address functional/ADL transfers PT goals addressed during session: Mobility/safety with mobility;Balance;Proper use of DME;Strengthening/ROM OT goals addressed during session: ADL's and self-care      AM-PAC PT "6 Clicks" Daily Activity     Outcome Measure Help from another person eating meals?: None Help from another person taking care of personal grooming?: A Little Help from another person toileting, which includes using toliet, bedpan, or urinal?: A Little Help from another person bathing (including washing, rinsing, drying)?: A Little Help from another person to put on and taking off regular upper body clothing?: A Little Help from another person to put on and taking off regular lower body clothing?: A Little 6 Click Score: 19   End of Session Equipment Utilized During Treatment: Gait belt;Rolling walker Nurse Communication: Mobility status;Precautions;Weight bearing status  Activity Tolerance: Patient tolerated treatment well Patient left: in chair;with call bell/phone  within reach;with chair alarm set  OT Visit Diagnosis: Unsteadiness on feet (R26.81);Other abnormalities of gait and mobility (R26.89);Muscle weakness (generalized) (M62.81);Pain Pain - Right/Left: Left Pain - part of body: Leg                 Time: 1415-1437 OT Time Calculation (min): 22 min Charges:  OT General Charges $OT Visit: 1 Visit OT Evaluation $OT Eval Moderate Complexity: 1 Mod G-Codes:     [place] MSOT, OTR/L Acute Rehab Pager: [phone] Office: [place] 04/29/2018, 3:44 PM

## 2018-04-29 NOTE — Evaluation (Signed)
Physical Therapy Evaluation Patient Details Name: [name] MRN: [id number] DOB: 11/08/1951 Today's Date: 04/29/2018   History of Present Illness  Pt is a 67 y/o female s/p L ORIF tib fx she sustained from falling down her stairs at home. PMH including but not limited to endometrial cancer and depression.    Clinical Impression  Pt presented supine in bed with HOB elevated, awake and willing to participate in therapy session. Prior to admission, pt reported that she ambulated with use of SPC and was independent with all functional mobility. Pt currently requires min guard for bed mobility and min A x2 for safety and stability with transfers using a RW. Pt with difficulty maintaining NWB L LE initially, improving slightly with practice. Pt would continue to benefit from skilled physical therapy services at this time while admitted and after d/c to address the below listed limitations in order to improve overall safety and independence with functional mobility.     Follow Up Recommendations SNF;Other (comment)(if pt refuses will need HHPT and 24/7 physical assistance)    Equipment Recommendations  3in1 (PT);Wheelchair (measurements PT);Wheelchair cushion (measurements PT);Other (comment)(w/c with elevating leg rests)    Recommendations for Other Services       Precautions / Restrictions Precautions Precautions: Fall Restrictions Weight Bearing Restrictions: Yes LLE Weight Bearing: Non weight bearing      Mobility  Bed Mobility Overal bed mobility: Needs Assistance Bed Mobility: Supine to Sit     Supine to sit: Min guard     General bed mobility comments: increased time and effort, min guard for safety  Transfers Overall transfer level: Needs assistance Equipment used: Rolling walker (2 wheeled) Transfers: Sit to/from [facility] Sit to Stand: Min assist;+2 safety/equipment;+2 physical assistance Stand pivot transfers: Min assist;+2 safety/equipment;+2  physical assistance       General transfer comment: increased time and effort, bed in slightly elevated position, cueing for safe hand placement, assist for stability with transitional movements. Pt required frequent cueing to maintain NWB L LE throughout (limited secondary to pain and weakness)  Ambulation/Gait             General Gait Details: not assessed as pt was having difficulty with maintaining NWB L LE throughout transfers  Stairs            Wheelchair Mobility    Modified Rankin (Stroke Patients Only)       Balance Overall balance assessment: Needs assistance Sitting-balance support: No upper extremity supported Sitting balance-Leahy Scale: Good     Standing balance support: During functional activity;Bilateral upper extremity supported Standing balance-Leahy Scale: Poor                               Pertinent Vitals/Pain Pain Assessment: 0-10 Pain Score: 10-Worst pain ever Pain Location: L LE Pain Descriptors / Indicators: Grimacing;Guarding Pain Intervention(s): Monitored during session;Repositioned;Patient requesting pain meds-RN notified    Home Living Family/patient expects to be discharged to:: Private residence Living Arrangements: Children;Other relatives Available Help at Discharge: Family;Available 24 hours/day Type of Home: House Home Access: Stairs to enter Entrance Stairs-Rails: Right Entrance Stairs-Number of Steps: 4 Home Layout: One level Home Equipment: Walker - 2 wheels;Cane - single point      Prior Function Level of Independence: Independent with assistive device(s)         Comments: pt ambulates with use of SPC     Hand Dominance  Extremity/Trunk Assessment   Upper Extremity Assessment Upper Extremity Assessment: Defer to OT evaluation    Lower Extremity Assessment Lower Extremity Assessment: LLE deficits/detail LLE Deficits / Details: Pt with decreased strength and ROM limitations  secondary to post-op pain. Pt able to minimally flex and extend all toes. Pt with decreased sensation to light touch throughout. LLE: Unable to fully assess due to pain       Communication   Communication: No difficulties  Cognition Arousal/Alertness: Awake/alert Behavior During Therapy: WFL for tasks assessed/performed Overall Cognitive Status: Within Functional Limits for tasks assessed                                        General Comments      Exercises     Assessment/Plan    PT Assessment Patient needs continued PT services  PT Problem List Decreased strength;Decreased range of motion;Decreased activity tolerance;Decreased balance;Decreased mobility;Decreased coordination;Decreased knowledge of use of DME;Decreased safety awareness;Decreased knowledge of precautions;Pain       PT Treatment Interventions DME instruction;Gait training;Stair training;Functional mobility training;Therapeutic exercise;Therapeutic activities;Balance training;Neuromuscular re-education;Patient/family education    PT Goals (Current goals can be found in the Care Plan section)  Acute Rehab PT Goals Patient Stated Goal: return home PT Goal Formulation: With patient Time For Goal Achievement: 05/13/18 Potential to Achieve Goals: Good    Frequency Min 3X/week   Barriers to discharge        Co-evaluation PT/OT/SLP Co-Evaluation/Treatment: Yes Reason for Co-Treatment: For patient/therapist safety;To address functional/ADL transfers PT goals addressed during session: Mobility/safety with mobility;Balance;Proper use of DME;Strengthening/ROM         AM-PAC PT "6 Clicks" Daily Activity  Outcome Measure Difficulty turning over in bed (including adjusting bedclothes, sheets and blankets)?: A Little Difficulty moving from lying on back to sitting on the side of the bed? : A Little Difficulty sitting down on and standing up from a chair with arms (e.g., wheelchair, bedside  commode, etc,.)?: Unable Help needed moving to and from a bed to chair (including a wheelchair)?: A Little Help needed walking in hospital room?: A Lot Help needed climbing 3-5 steps with a railing? : Total 6 Click Score: 13    End of Session Equipment Utilized During Treatment: Gait belt Activity Tolerance: Patient limited by fatigue;Patient limited by pain Patient left: in chair;with call bell/phone within reach;with chair alarm set Nurse Communication: Mobility status PT Visit Diagnosis: Other abnormalities of gait and mobility (R26.89);Pain Pain - Right/Left: Left Pain - part of body: Leg    Time: 1415-1437 PT Time Calculation (min) (ACUTE ONLY): 22 min   Charges:   PT Evaluation $PT Eval Moderate Complexity: 1 Mod     PT G Codes:        [place], PT, DPT [phone]   [place] 04/29/2018, 2:47 PM

## 2018-04-30 NOTE — Plan of Care (Signed)
  Problem: Clinical Measurements: Goal: Ability to maintain clinical measurements within normal limits will improve Outcome: Progressing   Problem: Clinical Measurements: Goal: Will remain free from infection Outcome: Progressing   Problem: Activity: Goal: Risk for activity intolerance will decrease Outcome: Progressing   Problem: Nutrition: Goal: Adequate nutrition will be maintained Outcome: Progressing   Problem: Pain Managment: Goal: General experience of comfort will improve Outcome: Progressing   

## 2018-04-30 NOTE — Progress Notes (Signed)
Orthopedic Tech Progress Note Patient Details:  [name] Dec 08, 1950 [id number]  Ortho Devices Type of Ortho Device: CAM walker Ortho Device/Splint Location: rle Ortho Device/Splint Interventions: Ordered, Application, Adjustment   Post Interventions Patient Tolerated: Well Instructions Provided: Care of device, Adjustment of device   [name] 04/30/2018, 12:34 PM

## 2018-04-30 NOTE — Progress Notes (Signed)
.   Orthopaedic Trauma Progress Note  S: Have significant pain in both left leg and right ankle. X-rays obtained last night of right ankle  O:  Vitals:   04/29/18 2034 04/30/18 0506  BP: 94/62 (!) 92/59  Pulse: 84 81  Resp: 15 17  Temp: 97.9 F (36.6 C) 98.1 F (36.7 C)  SpO2: 96% 96%   General: No acute distress awake alert and oriented x3 Left lower extremity: Incisions clean dry and intact.  Compartments are soft and compressible.  Active DF/PF, EHL. Significant stiffness with equinus contracture  Imaging: Stable postoperative images  Labs:  CBC    Component Value Date/Time   WBC 8.3 04/29/2018 0325   RBC 2.69 (L) 04/29/2018 0325   HGB 8.9 (L) 04/29/2018 0325   HGB 12.9 07/07/2017 1407   HCT 26.5 (L) 04/29/2018 0325   HCT 37.7 07/07/2017 1407   PLT 163 04/29/2018 0325   PLT 146 07/07/2017 1407   MCV 98.5 04/29/2018 0325   MCV 99.5 07/07/2017 1407   MCH 33.1 04/29/2018 0325   MCHC 33.6 04/29/2018 0325   RDW 11.8 04/29/2018 0325   RDW 12.0 07/07/2017 1407   LYMPHSABS 1.6 07/07/2017 1407   MONOABS 0.4 07/07/2017 1407   EOSABS 0.1 07/07/2017 1407   BASOSABS 0.0 07/07/2017 1407      A/P: 67 year old female status post open reduction internal fixation of left tibial fracture  Right ankle pain and swelling. Will order walking boot this AM. Continue with therapy  Plan for SNF vs HHPT with 24/7 assist  Weightbearing: TDWB LLE Insicional and dressing care: Dry dressing PRN Orthopedic device(s): None Showering: Not yet VTE prophylaxis: Lovenox 40mg  qd 30 days Pain control: Percocet and dilaudid Follow - up plan: 2 weeks   Shona Needles, MD [facility] [phone] (phone)

## 2018-05-01 ENCOUNTER — Other Ambulatory Visit: Payer: Self-pay

## 2018-05-01 ENCOUNTER — Encounter (HOSPITAL_COMMUNITY): Payer: Self-pay | Admitting: General Practice

## 2018-05-01 NOTE — Clinical Social Work Note (Signed)
Clinical Social Work Assessment  Patient Details  Name: [name] MRN: [id number] Date of Birth: [birth year]  Date of referral:  05/01/18               Reason for consult:  Facility Placement                Permission sought to share information with:  Facility Art therapist granted to share information::  Yes, Verbal Permission Granted  Name::        Agency::  SNF  Relationship::     Contact Information:     Housing/Transportation Living arrangements for the past 2 months:  Single Family Home Source of Information:  Patient Patient Interpreter Needed:  None Criminal Activity/Legal Involvement Pertinent to Current Situation/Hospitalization:  No - Comment as needed Significant Relationships:  Other Family Members, Adult Children Lives with:  Adult Children, Relatives Do you feel safe going back to the place where you live?  No Need for family participation in patient care:  No (Coment)  Care giving concerns: Pt from with daughter and will need SNF at discharge.  Social Worker assessment / plan: CSW met with patient at bedside to discuss SNF recommendations. Pt agreeable to SNF despite desires to return home. Pt has experience with SNF. CSW explained SNF process and placement. Pt gave permission to send SNF's to [place].   CSW will f/u for disposition.  Employment status:  Retired Forensic scientist:  Medicare PT Recommendations:  [place] / Referral to community resources:  [place]  Patient/Family's Response to care:  Pt agreeable to SNF.  Patient/Family's Understanding of and Emotional Response to Diagnosis, Current Treatment, and Prognosis:  Pt has good understanding of impairment and despite wanting to go home is agreeable to SNF. Pt hopes to return to baseline. Pt identifies that her home has 4 steps to get in and she is not confident that she is fully able to move up those steps yet. Pt  acknowledges that she used a cane before and was independent with ADL's. CSW validated feelings.  Emotional Assessment Appearance:  Appears stated age Attitude/Demeanor/Rapport:  (Cooperative) Affect (typically observed):  Accepting, Appropriate Orientation:  Oriented to Situation, Oriented to  Time, Oriented to Place, Oriented to Self Alcohol / Substance use:  Not Applicable Psych involvement (Current and /or in the community):  No (Comment)  Discharge Needs  Concerns to be addressed:  Discharge Planning Concerns Readmission within the last 30 days:  No Current discharge risk:  Dependent with Mobility, Physical Impairment Barriers to Discharge:  No Barriers Identified   Grace Baxter, LCSW 05/01/2018, 2:41 PM

## 2018-05-01 NOTE — NC FL2 (Signed)
[place] LEVEL OF CARE SCREENING TOOL     IDENTIFICATION  Patient Name: [name] Birthdate: 1951-06-20 Sex: female Admission Date (Current Location): 04/27/2018  [facility] and [place] Number:  Herbalist and Address:  [facility], [place] [address] [place], [place] [address]      Provider Number: [id number]  Attending Physician Name and Address:  Shona Needles, MD  Relative Name and Phone Number:  Valinda Fedie, daughter, [phone]    Current Level of Care: Hospital Recommended Level of Care: [place] Prior Approval Number:    Date Approved/Denied:   PASRR Number: [id number]  Discharge Plan: SNF    Current Diagnoses: Patient Active Problem List   Diagnosis Date Noted  . Fracture of tibial shaft, left, closed 04/27/2018  . Displaced comminuted fracture of shaft of left tibia, initial encounter for closed fracture 04/27/2018  . 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 Cervical cancer ([place]) 03/11/2017  . 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 Chronic pain disorder 03/11/2017  . Urinary incontinence 03/11/2017  . Family history of bone cancer   . Family history of leukemia   . Antineoplastic chemotherapy induced pancytopenia (CODE) ([place]) 10/29/2016  . Hypomagnesemia 10/26/2016  . Chemotherapy-induced thrombocytopenia 10/26/2016  . Chemotherapy induced neutropenia ([place]) 10/26/2016  . High risk medication use 10/13/2016  . Osteoporosis without current pathological fracture 09/28/2016  . Dental caries 09/02/2016  . Chronic periodontitis 09/02/2016  . Chronic pain due to injury 08/29/2016  . Constipation due to pain medication 08/29/2016  . Cervical cancer, FIGO stage IB2 (HCC) 08/23/2016    Orientation RESPIRATION BLADDER Height & Weight     Self, Time, Place, Situation  Normal Continent Weight: 138 lb (62.6 kg) Height:  5\' 2"  (157.5 cm)  BEHAVIORAL SYMPTOMS/MOOD NEUROLOGICAL  BOWEL NUTRITION STATUS      Continent Diet(See DC Summary)  AMBULATORY STATUS COMMUNICATION OF NEEDS Skin   Limited Assist(+2 for safety) Verbally Wound Vac, Surgical wounds                       Personal Care Assistance Level of Assistance  Bathing, Feeding, Dressing Bathing Assistance: Limited assistance Feeding assistance: Limited assistance Dressing Assistance: Limited assistance     Functional Limitations Info  Sight, Hearing, Speech Sight Info: Adequate Hearing Info: Adequate Speech Info: Adequate    SPECIAL CARE FACTORS FREQUENCY  PT (By licensed PT), OT (By licensed OT)     PT Frequency: 3x week OT Frequency: 2x week            Contractures      Additional Factors Info  Code Status, Allergies Code Status Info: Full code Allergies Info: No Known Allergies           Current Medications (05/01/2018):  This is the current hospital active medication list Current Facility-Administered Medications  Medication Dose Route Frequency Provider Last Rate Last Dose  . acetaminophen (TYLENOL) tablet 650 mg  650 mg Oral Q6H PRN Haddix, Thomasene Lot, MD       Or  . acetaminophen (TYLENOL) suppository 650 mg  650 mg Rectal Q6H PRN Haddix, Thomasene Lot, MD      . acetaminophen (TYLENOL) tablet 500 mg  500 mg Oral Q12H Haddix, Thomasene Lot, MD   500 mg at 05/01/18 1030  . cholecalciferol (VITAMIN D) tablet 2,000 Units  2,000 Units Oral Daily Haddix, Thomasene Lot, MD   2,000 Units at 05/01/18 1030  . diphenhydrAMINE (BENADRYL)  12.5 MG/5ML elixir 12.5-25 mg  12.5-25 mg Oral Q4H PRN Haddix, Thomasene Lot, MD      . docusate sodium (COLACE) capsule 100 mg  100 mg Oral BID Haddix, Thomasene Lot, MD   100 mg at 05/01/18 1030  . enoxaparin (LOVENOX) injection 40 mg  40 mg Subcutaneous Q24H Haddix, Thomasene Lot, MD   40 mg at 05/01/18 1030  . gabapentin (NEURONTIN) capsule 300 mg  300 mg Oral TID Haddix, Thomasene Lot, MD   300 mg at 05/01/18 1029  . HYDROmorphone (DILAUDID) injection 1 mg  1 mg Intravenous Q3H PRN  Haddix, Thomasene Lot, MD   1 mg at 05/01/18 1037  . lactated ringers infusion   Intravenous Continuous Haddix, Thomasene Lot, MD 10 mL/hr at 04/30/18 2027    . methocarbamol (ROBAXIN) tablet 500 mg  500 mg Oral Q6H PRN Haddix, Thomasene Lot, MD   500 mg at 05/01/18 0318   Or  . methocarbamol (ROBAXIN) 500 mg in dextrose 5 % 50 mL IVPB  500 mg Intravenous Q6H PRN Haddix, Thomasene Lot, MD      . ondansetron (ZOFRAN) tablet 4 mg  4 mg Oral Q6H PRN Haddix, Thomasene Lot, MD       Or  . ondansetron (ZOFRAN) injection 4 mg  4 mg Intravenous Q6H PRN Haddix, Thomasene Lot, MD   4 mg at 04/29/18 1443  . oxyCODONE-acetaminophen (PERCOCET/ROXICET) 5-325 MG per tablet 1 tablet  1 tablet Oral Q4H PRN Haddix, Thomasene Lot, MD   1 tablet at 04/30/18 0139  . oxyCODONE-acetaminophen (PERCOCET/ROXICET) 5-325 MG per tablet 2 tablet  2 tablet Oral Q6H PRN Haddix, Thomasene Lot, MD   2 tablet at 05/01/18 0602     Discharge Medications: Please see discharge summary for a list of discharge medications.  Relevant Imaging Results:  Relevant Lab Results:   Additional Information SS#: [id number]  Normajean Baxter, LCSW

## 2018-05-01 NOTE — Social Work (Addendum)
CSW met with patient at bedside and provided SNF list of offers.  CSW will f/u for patient to chose SNF.  4:26pm met with patient again and she has not chosen a SNF. CSW encouraged patient to select as she will be getting ready to dc more than likely tomorrow if med ready.  Elissa Hefty, LCSW Clinical Social Worker [phone]

## 2018-05-01 NOTE — Care Management Important Message (Signed)
Important Message  Patient Details  Name: [name] MRN: [id number] Date of Birth: August 19, 1951   Medicare Important Message Given:  Yes    [name] [address] 05/01/2018, 4:03 PM

## 2018-05-01 NOTE — Progress Notes (Signed)
Physical Therapy Treatment Patient Details Name: [name] MRN: [id number] DOB: [birth year] Today's Date: 05/01/2018    History of Present Illness Pt is a 67 y/o female s/p L ORIF tib fx she sustained from falling down her stairs at home. PMH including but not limited to endometrial cancer and depression.    PT Comments    Pt up in chair on arrival. Today's skilled session focused on providing pt with LE strengthening HEP. Pt using belt to assist with mobility on L LE. Will continue to follow acutely to progress mobility and functional independence.   Follow Up Recommendations  SNF;Other (comment)(if pt refuses will need HHPT and 24/7 physical assistance)     Equipment Recommendations  3in1 (PT);Wheelchair (measurements PT);Wheelchair cushion (measurements PT);Other (comment)(w/c with elevating leg rests)    Recommendations for Other Services       Precautions / Restrictions Precautions Precautions: Fall Required Braces or Orthoses: Other Brace/Splint Other Brace/Splint: CAM BOOT to R LE  Restrictions Weight Bearing Restrictions: Yes LLE Weight Bearing: Non weight bearing    Mobility  Bed Mobility               General bed mobility comments: in chair on arrival  Transfers                    Ambulation/Gait             General Gait Details: not assessed as pt was having difficulty with maintaining NWB L LE throughout transfers   Stairs             Wheelchair Mobility    Modified Rankin (Stroke Patients Only)       Balance Overall balance assessment: Needs assistance Sitting-balance support: No upper extremity supported Sitting balance-Leahy Scale: Good     Standing balance support: During functional activity;Bilateral upper extremity supported Standing balance-Leahy Scale: Poor                              Cognition Arousal/Alertness: Awake/alert Behavior During Therapy: WFL for tasks  assessed/performed Overall Cognitive Status: Within Functional Limits for tasks assessed                                        Exercises General Exercises - Lower Extremity Ankle Circles/Pumps: AROM;Both;10 reps;Seated Long Arc Quad: AROM;AAROM;Both;10 reps;Seated Heel Slides: AROM;AAROM;Both;10 reps;Seated Hip ABduction/ADduction: AROM;AAROM;Both;10 reps;Seated    General Comments        Pertinent Vitals/Pain Pain Assessment: Faces Faces Pain Scale: Hurts little more Pain Location: L LE Pain Descriptors / Indicators: Grimacing;Guarding Pain Intervention(s): Monitored during session;Limited activity within patient's tolerance;Ice applied    Home Living                      Prior Function            PT Goals (current goals can now be found in the care plan section) Acute Rehab PT Goals Patient Stated Goal: return home PT Goal Formulation: With patient Time For Goal Achievement: 05/13/18 Potential to Achieve Goals: Good Progress towards PT goals: Progressing toward goals    Frequency    Min 3X/week      PT Plan Current plan remains appropriate    Co-evaluation              AM-PAC PT "6 Clicks"  Daily Activity  Outcome Measure  Difficulty turning over in bed (including adjusting bedclothes, sheets and blankets)?: A Little Difficulty moving from lying on back to sitting on the side of the bed? : A Little Difficulty sitting down on and standing up from a chair with arms (e.g., wheelchair, bedside commode, etc,.)?: Unable Help needed moving to and from a bed to chair (including a wheelchair)?: A Little Help needed walking in hospital room?: A Lot Help needed climbing 3-5 steps with a railing? : Total 6 Click Score: 13    End of Session Equipment Utilized During Treatment: Gait belt Activity Tolerance: Patient tolerated treatment well Patient left: in chair;with call bell/phone within reach;with chair alarm set Nurse  Communication: Mobility status PT Visit Diagnosis: Other abnormalities of gait and mobility (R26.89);Pain Pain - Right/Left: Left Pain - part of body: Leg     Time: 8546-2703 PT Time Calculation (min) (ACUTE ONLY): 20 min  Charges:  $Therapeutic Exercise: 8-22 mins                    G Codes:       Benjiman Core, [place] Pager [phone] Acute Rehab   Allena Katz 05/01/2018, 3:41 PM

## 2018-05-01 NOTE — Progress Notes (Signed)
Occupational Therapy Treatment Patient Details Name: [name] MRN: [id number] DOB: 1951-07-22 Today's Date: 05/01/2018    History of present illness Pt is a 67 y/o female s/p L ORIF tib fx she sustained from falling down her stairs at home. PMH including but not limited to endometrial cancer and depression.   OT comments  Patient pleasant and cooperative, progressing well.  Completed LB dressing today with moderate assistance, and functional transfers using rolling walker with minimal assist.  She continues to require cueing to maintain NWB to LLE. She fatigues easily and is limited by pain when L LE is not elevated (self care, transfers).  Patient will benefit from continued skilled OT services while admitted.    Follow Up Recommendations  SNF;Supervision/Assistance - 24 hour    Equipment Recommendations  3 in 1 bedside commode    Recommendations for Other Services      Precautions / Restrictions Precautions Precautions: Fall Required Braces or Orthoses: Other Brace/Splint Other Brace/Splint: CAM BOOT to R LE  Restrictions Weight Bearing Restrictions: Yes LLE Weight Bearing: Non weight bearing       Mobility Bed Mobility Overal bed mobility: Needs Assistance Bed Mobility: Supine to Sit     Supine to sit: Min guard     General bed mobility comments: assistance to manage L LE only   Transfers Overall transfer level: Needs assistance Equipment used: Rolling walker (2 wheeled) Transfers: Stand Pivot Transfers   Stand pivot transfers: Min assist       General transfer comment: cueing to maintain NWB L LE throughout transfer, limited by pain, cueing for sequencing and hand placement     Balance Overall balance assessment: Needs assistance Sitting-balance support: No upper extremity supported Sitting balance-Leahy Scale: Good Sitting balance - Comments: outside BOS (foward lean for donning socks) with min guard assist    Standing balance support: During  functional activity;Bilateral upper extremity supported Standing balance-Leahy Scale: Poor                             ADL either performed or assessed with clinical judgement   ADL Overall ADL's : Needs assistance/impaired                     Lower Body Dressing: Minimal assistance;Sitting/lateral leans Lower Body Dressing Details (indicate cue type and reason): min guard assist to don R sock, but required assistance to don L sock seated EOB  Toilet Transfer: Minimal assistance;Stand-pivot;RW(simulated to recliner ) Toilet Transfer Details (indicate cue type and reason): Pt performing stand pivot transfer using RW with minA for balance, min cueing to maintain NWB status L LE. Patient noted increased difficulty with transfer due to weight of CAM Boot to R LE, reports heavy and 'sliding'--nursing notified and requested smaller size          Functional mobility during ADLs: Minimal assistance;Rolling walker(stand pivot only ) General ADL Comments: patient motivated, limited by pain      Vision Baseline Vision/History: Wears glasses Patient Visual Report: No change from baseline     Perception     Praxis      Cognition Arousal/Alertness: Awake/alert Behavior During Therapy: WFL for tasks assessed/performed Overall Cognitive Status: Within Functional Limits for tasks assessed  Exercises General Exercises - Lower Extremity Ankle Circles/Pumps: AROM;Both;10 reps;Seated Long Arc Quad: AROM;AAROM;Both;10 reps;Seated Heel Slides: AROM;AAROM;Both;10 reps;Seated Hip ABduction/ADduction: AROM;AAROM;Both;10 reps;Seated   Shoulder Instructions       General Comments      Pertinent Vitals/ Pain       Pain Assessment: Faces Faces Pain Scale: Hurts little more Pain Location: L LE Pain Descriptors / Indicators: Grimacing;Guarding Pain Intervention(s): Limited activity within patient's tolerance;Monitored  during session;Ice applied;Repositioned  Home Living                                          Prior Functioning/Environment              Frequency  Min 2X/week        Progress Toward Goals  OT Goals(current goals can now be found in the care plan section)  Progress towards OT goals: Progressing toward goals  Acute Rehab OT Goals Patient Stated Goal: return home OT Goal Formulation: With patient Time For Goal Achievement: 05/13/18 Potential to Achieve Goals: Good  Plan Discharge plan remains appropriate;Frequency remains appropriate    Co-evaluation                 AM-PAC PT "6 Clicks" Daily Activity     Outcome Measure   Help from another person eating meals?: None Help from another person taking care of personal grooming?: A Little Help from another person toileting, which includes using toliet, bedpan, or urinal?: A Lot Help from another person bathing (including washing, rinsing, drying)?: A Little Help from another person to put on and taking off regular upper body clothing?: None Help from another person to put on and taking off regular lower body clothing?: A Lot 6 Click Score: 18    End of Session Equipment Utilized During Treatment: Gait belt;Rolling walker;Other (comment)(CAM BOOT)  OT Visit Diagnosis: Unsteadiness on feet (R26.81);Other abnormalities of gait and mobility (R26.89);Muscle weakness (generalized) (M62.81);Pain Pain - Right/Left: Left Pain - part of body: Leg   Activity Tolerance Patient tolerated treatment well   Patient Left in chair;with call bell/phone within reach;with chair alarm set   Nurse Communication Mobility status;Precautions;Weight bearing status        Time: 1407-1430 OT Time Calculation (min): 23 min  Charges: OT General Charges $OT Visit: 1 Visit OT Treatments $Self Care/Home Management : 8-22 mins  Delight Stare, OTR/L  Pager [place] 05/01/2018, 3:46 PM

## 2018-05-02 ENCOUNTER — Encounter (HOSPITAL_COMMUNITY): Payer: Self-pay | Admitting: Orthopedic Surgery

## 2018-05-02 ENCOUNTER — Encounter (HOSPITAL_COMMUNITY): Payer: Self-pay

## 2018-05-02 ENCOUNTER — Inpatient Hospital Stay (HOSPITAL_COMMUNITY): Payer: Medicare Other

## 2018-05-02 ENCOUNTER — Telehealth: Payer: Self-pay | Admitting: *Deleted

## 2018-05-02 DIAGNOSIS — F32A Depression, unspecified: Secondary | ICD-10-CM | POA: Diagnosis present

## 2018-05-02 DIAGNOSIS — M47819 Spondylosis without myelopathy or radiculopathy, site unspecified: Secondary | ICD-10-CM | POA: Diagnosis present

## 2018-05-02 DIAGNOSIS — M545 Low back pain, unspecified: Secondary | ICD-10-CM | POA: Diagnosis present

## 2018-05-02 DIAGNOSIS — M810 Age-related osteoporosis without current pathological fracture: Secondary | ICD-10-CM | POA: Diagnosis present

## 2018-05-02 DIAGNOSIS — G8929 Other chronic pain: Secondary | ICD-10-CM | POA: Diagnosis present

## 2018-05-02 DIAGNOSIS — F329 Major depressive disorder, single episode, unspecified: Secondary | ICD-10-CM | POA: Diagnosis present

## 2018-05-02 LAB — COMPREHENSIVE METABOLIC PANEL
ALBUMIN: 3.3 g/dL — AB (ref 3.5–5.0)
ALT: 11 U/L — ABNORMAL LOW (ref 14–54)
AST: 26 U/L (ref 15–41)
Alkaline Phosphatase: 94 U/L (ref 38–126)
Anion gap: 12 (ref 5–15)
BUN: 10 mg/dL (ref 6–20)
CHLORIDE: 101 mmol/L (ref 101–111)
CO2: 25 mmol/L (ref 22–32)
Calcium: 9 mg/dL (ref 8.9–10.3)
Creatinine, Ser: 0.89 mg/dL (ref 0.44–1.00)
GFR calc Af Amer: >60 mL/min (ref >60)
GLUCOSE: 105 mg/dL — AB (ref 65–99)
POTASSIUM: 4 mmol/L (ref 3.5–5.1)
SODIUM: 138 mmol/L (ref 135–145)
Total Bilirubin: 0.6 mg/dL (ref 0.3–1.2)
Total Protein: 6.4 g/dL — ABNORMAL LOW (ref 6.5–8.1)

## 2018-05-02 LAB — CBC WITH DIFFERENTIAL/PLATELET
Abs Immature Granulocytes: 0 10*3/uL (ref 0.0–0.1)
BASOS ABS: 0 10*3/uL (ref 0.0–0.1)
BASOS PCT: 0 %
EOS ABS: 0.1 10*3/uL (ref 0.0–0.7)
Eosinophils Relative: 1 %
HCT: 30.4 % — ABNORMAL LOW (ref 36.0–46.0)
Hemoglobin: 10 g/dL — ABNORMAL LOW (ref 12.0–15.0)
IMMATURE GRANULOCYTES: 0 %
Lymphocytes Relative: 15 %
Lymphs Abs: 1.5 10*3/uL (ref 0.7–4.0)
MCH: 33.3 pg (ref 26.0–34.0)
MCHC: 32.9 g/dL (ref 30.0–36.0)
MCV: 101.3 fL — ABNORMAL HIGH (ref 78.0–100.0)
Monocytes Absolute: 0.8 10*3/uL (ref 0.1–1.0)
Monocytes Relative: 9 %
Neutro Abs: 7.2 10*3/uL (ref 1.7–7.7)
Neutrophils Relative %: 75 %
PLATELETS: 235 10*3/uL (ref 150–400)
RBC: 3 MIL/uL — AB (ref 3.87–5.11)
RDW: 12.1 % (ref 11.5–15.5)
WBC: 9.6 10*3/uL (ref 4.0–10.5)

## 2018-05-02 LAB — URINALYSIS, ROUTINE W REFLEX MICROSCOPIC
BILIRUBIN URINE: NEGATIVE
Glucose, UA: NEGATIVE mg/dL
Hgb urine dipstick: NEGATIVE
KETONES UR: NEGATIVE mg/dL
LEUKOCYTES UA: NEGATIVE
NITRITE: NEGATIVE
Protein, ur: NEGATIVE mg/dL
Specific Gravity, Urine: 1.006 (ref 1.005–1.030)
pH: 7 (ref 5.0–8.0)

## 2018-05-02 MED ORDER — HYDROMORPHONE HCL 2 MG/ML IJ SOLN
1.0000 mg | Freq: Two times a day (BID) | INTRAMUSCULAR | Status: DC | PRN
Start: 2018-05-02 — End: 2018-05-03

## 2018-05-02 NOTE — Progress Notes (Signed)
Orthopedic Trauma Service Progress Note   Patient ID: [name] MRN: [id number] DOB/AGE: September 06, 1951 67 y.o.  Subjective:  Doing fair Pain is tolerable  T-max of 102.2 F at 0530 this morning  Patient denies any chest pain or shortness of breath No dysuria She was up a little bit yesterday in the chair  Her left knee is resting in quite a bit of flexion this morning with a pillow under her knee  Review of Systems  Constitutional: Negative for chills and fever.  Respiratory: Negative for shortness of breath and wheezing.   Cardiovascular: Negative for chest pain and palpitations.  Gastrointestinal: Negative for abdominal pain, nausea and vomiting.  Neurological: Negative for tingling and tremors.    Objective:   VITALS:   Vitals:   05/01/18 0400 05/01/18 2109 05/02/18 0538 05/02/18 0648  BP: 106/72 120/74 124/80   Pulse: 81 99 (!) 125   Resp: 18 17 18    Temp: 98.4 F (36.9 C) 99.5 F (37.5 C) (!) 102.2 F (39 C) 100.1 F (37.8 C)  TempSrc: Oral Oral Oral Oral  SpO2: 100% 98% 96%   Weight:      Height:        Estimated body mass index is 25.24 kg/m as calculated from the following:   Height as of this encounter: 5\' 2"  (1.575 m).   Weight as of this encounter: 62.6 kg (138 lb).   Intake/Output      06/10 0701 - 06/11 0700 06/11 0701 - 06/12 0700   P.O. 600    I.V. (mL/kg) 100 (1.6)    IV Piggyback 0    Total Intake(mL/kg) 700 (11.2)    Urine (mL/kg/hr) 2575 (1.7)    Total Output 2575    Net -1875           LABS  No results found for this or any previous visit (from the past 24 hour(s)).   PHYSICAL EXAM:   Gen: in bed, NAD, appears comfortable overall  Lungs: clear B  Cardiac: s1 and s2, RRR on my exam  Abd: + BS, NTND Ext:       Left Lower extremity   Ace to left knee  Moderate swelling to left foot with some ecchymosis along the lower leg  Dressing removed   Operative sites look great   No erythema, no  drainage   No signs of infection   No DCT  Compartments soft  No pain with passive stretch   DPN, SPN, TN sensation intact  EHL, FHL, AT, PT, peroneals, gastroc motor grossly intact    Adjusted pillow to be under her ankle so she can get her knee into more extension   Assessment/Plan: 4 Days Post-Op   Active Problems:   Fracture of tibial shaft, left, closed   Displaced comminuted fracture of shaft of left tibia, initial encounter for closed fracture   Anti-infectives (From admission, onward)   Start     Dose/Rate Route Frequency Ordered Stop   04/28/18 2330  ceFAZolin (ANCEF) IVPB 2g/100 mL premix     2 g 200 mL/hr over 30 Minutes Intravenous Every 8 hours 04/28/18 1846 04/29/18 1638   04/28/18 1555  vancomycin (VANCOCIN) powder  Status:  Discontinued       As needed 04/28/18 1555 04/28/18 1652   04/28/18 1415  ceFAZolin (ANCEF) IVPB 2g/100 mL premix  Status:  Discontinued     2 g 200 mL/hr over 30 Minutes Intravenous To ShortStay Surgical 04/28/18 1401 04/28/18 1825   04/28/18  1045  ceFAZolin (ANCEF) IVPB 2g/100 mL premix     2 g 200 mL/hr over 30 Minutes Intravenous To ShortStay Surgical 04/28/18 1041 04/28/18 1540    .  POD/HD#: 31  67 y/o female s/p fall with L proximal tibia fracture   - fall   - L proximal tibia fracture s/p ORIF  TDWB L leg  Unrestricted ROM L knee and ankle  Ice and elevate  TED hose for swelling control  PT/OT  Dressing changes as needed, can leave wounds open to air    Ok to shower and clean wounds with soap and water   - Fever, tachycardia  Check CXR, U/A, doppler u/s  Repeat CMET and CBC with diff   Suspect ATX   Continue with IS    - Pain management:  Pt still using quite a bit of IV meds   Will change her IV dilaudid to q12h    Please use all PO meds preferentially. Only use IV meds if there is severe breakthrough pain after all POs have been given   - ABL anemia/Hemodynamics  Follow up cbc  Stable other wise   - Medical  issues   As above  - DVT/PE prophylaxis:  Lovenox  - ID:   Completed periop abx  No abx at this current time as fever w/u ongoing   - FEN/GI prophylaxis/Foley/Lines:  Reg diet    DC pure wick   Pt should be able to get up with assistance to [facility] or go to bathroom or sit on bedpan     LR @ KVO    Aggressive IS- q1h while awake    - Dispo:  Hopeful for dc to SNF tomorrow   Monitor temp trend   Vitals q4h      Jari Pigg, PA-C [facility] [phone] Levi Aland (C) 05/02/2018, 10:12 AM

## 2018-05-02 NOTE — Progress Notes (Signed)
Received call from lab stating that they didn't have enough blood sample for CBC and also CMP sample was contaminated. Order placed for CBC with Diff and CMP to be recollected.

## 2018-05-02 NOTE — Telephone Encounter (Signed)
Called and spoke with the daughter, cancelled the appt for June 19th. Patient is currently admitted to the hospital and then transferring to [facility]. Told the daughter to have the patient call us once she's home and settled.

## 2018-05-02 NOTE — Social Work (Addendum)
CSW discussed SNF offers with patient. Pt accepted SNF bed at [facility].  CSW confirmed bed offer with SNF.   CSW will await disposition as pt not medically ready today due to fever.  Elissa Hefty, LCSW Clinical Social Worker [phone]

## 2018-05-02 NOTE — Progress Notes (Signed)
Patient has a temperature of 100.2 F this morning. Pt given 650 mg of tylenol and has ice to her left leg. Will recheck patient's temperature.

## 2018-05-02 NOTE — Progress Notes (Signed)
Patient informed there is an order from the MD to d/c her pure wick. Patient stated  to nurse "the Doctors taking care of me here do not know my problems and I will like to keep the pure wick on overnight". I informed patient myself and Judson Roch will be helping her to use the bedside commode or bedpan. Patient politely declined not to use neither the bed pan or bedside commode.  I explained patient the risk and benefits , however patient still declined stating she wants to keep the pure wick and and rest for tonight.

## 2018-05-03 ENCOUNTER — Inpatient Hospital Stay (HOSPITAL_COMMUNITY): Payer: Medicare Other

## 2018-05-03 DIAGNOSIS — Z9889 Other specified postprocedural states: Secondary | ICD-10-CM

## 2018-05-03 MED ORDER — ENOXAPARIN SODIUM 40 MG/0.4ML ~~LOC~~ SOLN: 40.0000 mg | SUBCUTANEOUS | 0 refills | Status: AC

## 2018-05-03 MED ORDER — OXYCODONE-ACETAMINOPHEN 5-325 MG PO TABS: 2.0000 | ORAL_TABLET | Freq: Four times a day (QID) | ORAL | 0 refills | Status: AC | PRN

## 2018-05-03 MED ORDER — METHOCARBAMOL 500 MG PO TABS: 500.0000 mg | ORAL_TABLET | Freq: Four times a day (QID) | ORAL | 0 refills | Status: AC | PRN

## 2018-05-03 NOTE — Clinical Social Work Placement (Signed)
   CLINICAL SOCIAL WORK PLACEMENT  NOTE  Date:  05/03/2018  Patient Details  Name: [name] MRN: [id number] Date of Birth: Jun 22, 1951  Clinical Social Work is seeking post-discharge placement for this patient at the [place] level of care (*CSW will initial, date and re-position this form in  chart as items are completed):  Yes   Patient/family provided with [facility] Work Department's list of facilities offering this level of care within the geographic area requested by the patient (or if unable, by the patient's family).  Yes   Patient/family informed of their freedom to choose among providers that offer the needed level of care, that participate in Medicare, Medicaid or managed care program needed by the patient, have an available bed and are willing to accept the patient.  Yes   Patient/family informed of [facility]'s ownership interest in [facility] and [facility], as well as of the fact that they are under no obligation to receive care at these facilities.  PASRR submitted to EDS on       PASRR number received on 05/01/18     Existing PASRR number confirmed on       FL2 transmitted to all facilities in geographic area requested by pt/family on 05/01/18     FL2 transmitted to all facilities within larger geographic area on       Patient informed that his/her managed care company has contracts with or will negotiate with certain facilities, including the following:        Yes   Patient/family informed of bed offers received.  Patient chooses bed at [facility]     Physician recommends and patient chooses bed at      Patient to be transferred to [facility] on 05/03/18.  Patient to be transferred to facility by [facility]     Patient family notified on 05/03/18 of transfer.  Name of family member notified:  pt responsible for self     PHYSICIAN       Additional Comment:     _______________________________________________ Normajean Baxter, LCSW 05/03/2018, 9:28 AM

## 2018-05-03 NOTE — Plan of Care (Signed)
  Problem: Clinical Measurements: Goal: Ability to maintain clinical measurements within normal limits will improve Outcome: Progressing   Problem: Clinical Measurements: Goal: Will remain free from infection Outcome: Progressing   Problem: Activity: Goal: Risk for activity intolerance will decrease Outcome: Progressing   Problem: Nutrition: Goal: Adequate nutrition will be maintained Outcome: Progressing   Problem: Pain Managment: Goal: General experience of comfort will improve Outcome: Progressing   

## 2018-05-03 NOTE — Progress Notes (Signed)
Preliminary notes--Bilateral lower extremities venous duplex exam completed. Negative for DVT on limited access due to patient could not tolerant the pain on the left leg surgical site.  Left calf veins not be able to evaluate, cannot exclude the calf veins thrombosis.    Hongying Lenord Fralix (RDMS RVT) 05/03/18 9:16 AM

## 2018-05-03 NOTE — Progress Notes (Signed)
Patient offered prune juice to help her have a BM since she has not had a BM 04/27/18. Patient decline prune juice and informed nurse " I am not ready yet"

## 2018-05-03 NOTE — Social Work (Addendum)
Clinical Social Worker facilitated patient discharge including contacting patient family and facility to confirm patient discharge plans.  Clinical information faxed to facility and family agreeable with plan.    CSW arranged ambulance transport via [facility] to [facility].    RN to call [phone] to give report prior to discharge. Pt going to Room 307P.  Clinical Social Worker will sign off for now as social work intervention is no longer needed. Please consult [place] again if new need arises.  Elissa Hefty, LCSW Clinical Social Worker [phone]

## 2018-05-03 NOTE — Discharge Summary (Signed)
[facility] ([facility])  Patient ID: [name] MRN: [id number] DOB/AGE: August 10, 1951 67 y.o.  Admit date: 04/27/2018 Discharge date: 05/03/2018  Admission Diagnoses:Fracture of tibial shaft, left, closed   Displaced comminuted fracture of shaft of left tibia, initial encounter for closed fracture   Osteoporosis   Chronic low back pain   Degenerative spinal arthritis   Depression  Discharge Diagnoses:  Active Problems:   Fracture of tibial shaft, left, closed   Displaced comminuted fracture of shaft of left tibia, initial encounter for closed fracture   Osteoporosis   Chronic low back pain   Degenerative spinal arthritis   Depression   Past Medical History:  Diagnosis Date  . Arthritis    HANDS  . Borderline hypertension    ON  MEDS WENT OFF 2016 DUE TO DID NOT LIKE MEDS  . Cancer ([place]) 2017   ENDO METRIAL CANCER  . Chronic low back pain   . Degenerative spinal arthritis   . Depression   . Endometrial polyp   . Family history of adverse reaction to anesthesia    SISTER WITH SEVERE PONV  . Family history of bone cancer   . Family history of leukemia   . History of radiation therapy 09/23/16-10/28/16   pelvis 45 Gy in 25 fractions  . History of radiation therapy 12/11/7-10/2016   vaginal brachytherapy to vaginal cuff 18 Gy in 3 fractions  . Numbness in left leg    DUE TO BACK INJURY 2007  . Osteoporosis   . PMB (postmenopausal bleeding)   . Pneumonia 2015  . Wears glasses      Procedures Performed: 04/28/2018: CPT 27758-Open reduction internal fixation of left tibia fracture  Discharged Condition: good  Hospital Course: The patient was admitted for surgery the following day.  She did well following this.  She mobilize with physical therapy and was found fit for discharge to a skilled nursing facility.  This was arranged and she eventually was discharged to skilled nursing facility.  Postoperative day 3 she had a temperature of 102.  Work-up including  UA chest x-ray and blood cultures returned without any thing positive.  She was encouraged for incentive spirometry and she remained afebrile for the rest of her hospital stay.  Upon discharge she was tolerating regular diet she was voiding spontaneously and pain was well controlled with oral medications.  She was discharged on postoperative day 5.  Consults: None  Significant Diagnostic Studies: None  Treatments: surgery: As above  Discharge Exam: General: Well-appearing no acute distress Left lower extremity: Incision is clean dry and intact without drainage.  TED hose in place.  Significant equinus contracture stretch stick to approximately neutral.  Showed her stretching exercises.  Endorses sensation in the dorsum and plantar aspect of the foot.  Motor or sensory function is intact.  Disposition: Discharge disposition: 03-Skilled Nursing Facility        Allergies as of 05/03/2018   No Known Allergies     Medication List    STOP taking these medications   ibuprofen 800 MG tablet Commonly known as:  ADVIL,MOTRIN   nabumetone 500 MG tablet Commonly known as:  RELAFEN   oxyCODONE-acetaminophen 7.5-325 MG tablet Commonly known as:  PERCOCET Replaced by:  oxyCODONE-acetaminophen 5-325 MG tablet     TAKE these medications   cholecalciferol 1000 units tablet Commonly known as:  VITAMIN D Take 2,000 Units by mouth daily.   docusate sodium 100 MG capsule Commonly known as:  COLACE Take 100 mg by mouth  2 (two) times daily.   enoxaparin 40 MG/0.4ML injection Commonly known as:  LOVENOX Inject 0.4 mLs (40 mg total) into the skin daily.   gabapentin 300 MG capsule Commonly known as:  NEURONTIN TAKE 2 CAPSULES BY MOUTH IN THE MORNING, AND TAKE 3 CAPSULES BY MOUTH IN THE EVENING   methocarbamol 500 MG tablet Commonly known as:  ROBAXIN Take 1 tablet (500 mg total) by mouth every 6 (six) hours as needed for muscle spasms.   oxyCODONE-acetaminophen 5-325 MG  tablet Commonly known as:  PERCOCET/ROXICET Take 2 tablets by mouth every 6 (six) hours as needed for severe pain. Replaces:  oxyCODONE-acetaminophen 7.5-325 MG tablet   polyethylene glycol packet Commonly known as:  MIRALAX / GLYCOLAX Take 17 g by mouth daily as needed for moderate constipation.      Follow-up Information    Haddix, Thomasene Lot, MD. Schedule an appointment as soon as possible for a visit in 2 week(s).   Specialty:  Orthopedic Surgery Contact information: [place] [address] [phone]           Discharge Instructions and Plan: Patient will be touchdown weightbearing to the left lower extremity.   she will return to see me in 2 weeks for suture removal and x-rays.  She will be on Lovenox for DVT prophylaxis for approximately 30 days postoperatively.  Signed:  Shona Needles, MD [facility] [phone] (phone)  05/03/2018, 8:24 AM

## 2018-05-03 NOTE — Progress Notes (Signed)
Pt to discharge to [facility]. Report called in to [facility]. Patient questions and concerns adressed. Patient discharging via [facility] to SNF. No discomfort voiced at this time. Took all her personal belongings.

## 2018-05-03 NOTE — Discharge Instructions (Signed)
[facility]opaedic [facility]uma [facility] Discharge Instructions   General Discharge Instructions  WEIGHT BEARING STATUS: Touchdown weightbearing to left leg  RANGE OF MOTION/ACTIVITY: No restrictions for knee or ankle ROM. Aggressive heel cord stretching  Wound Care: Okay to leave open to air as needed. Okay to shower  DVT/PE prophylaxis: Lovenox 40 mg once daily  Diet: as you were eating previously.  Can use over the counter stool softeners and bowel preparations, such as Miralax, to help with bowel movements.  Narcotics can be constipating.  Be sure to drink plenty of fluids  PAIN MEDICATION USE AND EXPECTATIONS  You have likely been given narcotic medications to help control your pain.  After a traumatic event that results in an fracture (broken bone) with or without surgery, it is ok to use narcotic pain medications to help control one's pain.  We understand that everyone responds to pain differently and each individual patient will be evaluated on a regular basis for the continued need for narcotic medications. Ideally, narcotic medication use should last no more than 6-8 weeks (coinciding with fracture healing).   As a patient it is your responsibility as well to monitor narcotic medication use and report the amount and frequency you use these medications when you come to your office visit.   We would also advise that if you are using narcotic medications, you should take a dose prior to therapy to maximize you participation.  IF YOU ARE ON NARCOTIC MEDICATIONS IT IS NOT PERMISSIBLE TO OPERATE A MOTOR VEHICLE (MOTORCYCLE/CAR/TRUCK/MOPED) OR HEAVY MACHINERY DO NOT MIX NARCOTICS WITH OTHER CNS (CENTRAL NERVOUS SYSTEM) DEPRESSANTS SUCH AS ALCOHOL   STOP SMOKING OR USING NICOTINE PRODUCTS!!!!  As discussed nicotine severely impairs your body's ability to heal surgical and traumatic wounds but also impairs bone healing.  Wounds and bone heal by forming microscopic blood vessels (angiogenesis) and  nicotine is a vasoconstrictor (essentially, shrinks blood vessels).  Therefore, if vasoconstriction occurs to these microscopic blood vessels they essentially disappear and are unable to deliver necessary nutrients to the healing tissue.  This is one modifiable factor that you can do to dramatically increase your chances of healing your injury.    (This means no smoking, no nicotine gum, patches, etc)  DO NOT USE NONSTEROIDAL ANTI-INFLAMMATORY DRUGS (NSAID'S)  Using products such as Advil (ibuprofen), Aleve (naproxen), Motrin (ibuprofen) for additional pain control during fracture healing can delay and/or prevent the healing response.  If you would like to take over the counter (OTC) medication, Tylenol (acetaminophen) is ok.  However, some narcotic medications that are given for pain control contain acetaminophen as well. Therefore, you should not exceed more than 4000 mg of tylenol in a day if you do not have liver disease.  Also note that there are may OTC medicines, such as cold medicines and allergy medicines that my contain tylenol as well.  If you have any questions about medications and/or interactions please ask your doctor/PA or your pharmacist.      ICE AND ELEVATE INJURED/OPERATIVE EXTREMITY  Using ice and elevating the injured extremity above your heart can help with swelling and pain control.  Icing in a pulsatile fashion, such as 20 minutes on and 20 minutes off, can be followed.    Do not place ice directly on skin. Make sure there is a barrier between to skin and the ice pack.    Using frozen items such as frozen peas works well as the conform nicely to the are that needs to be iced.  USE  AN ACE WRAP OR TED HOSE FOR SWELLING CONTROL  In addition to icing and elevation, Ace wraps or TED hose are used to help limit and resolve swelling.  It is recommended to use Ace wraps or TED hose until you are informed to stop.    When using Ace Wraps start the wrapping distally (farthest away from  the body) and wrap proximally (closer to the body)   Example: If you had surgery on your leg or thing and you do not have a splint on, start the ace wrap at the toes and work your way up to the thigh        If you had surgery on your upper extremity and do not have a splint on, start the ace wrap at your fingers and work your way up to the upper arm  IF YOU ARE IN A SPLINT OR CAST DO NOT [place]   If your splint gets wet for any reason please contact the office immediately. You may shower in your splint or cast as long as you keep it dry.  This can be done by wrapping in a cast cover or garbage back (or similar)  Do Not stick any thing down your splint or cast such as pencils, money, or hangers to try and scratch yourself with.  If you feel itchy take benadryl as prescribed on the bottle for itching  IF YOU ARE IN A CAM BOOT (BLACK BOOT)  You may remove boot periodically. Perform daily dressing changes as noted below.  Wash the liner of the boot regularly and wear a sock when wearing the boot. It is recommended that you sleep in the boot until told otherwise  CALL THE OFFICE WITH ANY QUESTIONS OR CONCERNS: [phone]

## 2018-05-10 ENCOUNTER — Ambulatory Visit: Payer: Self-pay | Admitting: Gynecologic Oncology

## 2018-07-17 ENCOUNTER — Other Ambulatory Visit: Payer: Self-pay | Admitting: Hematology and Oncology

## 2018-07-17 NOTE — Telephone Encounter (Signed)
Hi Tammi, please refill Hi Santiago Glad, please make sure she has appt to see Dr. Denman George

## 2018-07-18 ENCOUNTER — Telehealth: Payer: Self-pay | Admitting: Oncology

## 2018-07-18 ENCOUNTER — Telehealth: Payer: Self-pay | Admitting: *Deleted

## 2018-07-18 NOTE — Telephone Encounter (Signed)
Left a message for patient on her home phone with appointment to see Dr. Denman George on 08/23/18 at 10:15 am.  Requested a return call.

## 2018-07-18 NOTE — Telephone Encounter (Signed)
CALLED PATIENT TO INFORM OF FU APPT. WITH DR. KINARD ON 03/19/19 @ 11 AM, LVM FOR A RETURN CALL

## 2018-08-21 ENCOUNTER — Telehealth: Payer: Self-pay

## 2018-08-21 NOTE — Telephone Encounter (Signed)
Returned pt's call regarding she would like to cancel her appt for Oct 2 nd- no answer, left her VM- I let her know appt has been cancelled but to call us back to reschedule.

## 2018-08-23 ENCOUNTER — Ambulatory Visit: Payer: Self-pay | Admitting: Gynecologic Oncology

## 2018-10-11 ENCOUNTER — Other Ambulatory Visit: Payer: Self-pay | Admitting: Hematology and Oncology

## 2019-03-19 ENCOUNTER — Ambulatory Visit: Admission: RE | Admit: 2019-03-19 | Payer: Medicare Other | Source: Ambulatory Visit | Admitting: Radiation Oncology
